# Patient Record
Sex: Female | Born: 1970
Health system: Southern US, Community
[De-identification: ages and names within clinical notes are randomized; demographics above are authoritative.]

## PROBLEM LIST (undated history)

## (undated) ENCOUNTER — Ambulatory Visit (HOSPITAL_COMMUNITY): Admission: EM | Discharge status: Absent without leave | Payer: 59

## (undated) DIAGNOSIS — R002 Palpitations: Secondary | ICD-10-CM

## (undated) DIAGNOSIS — E039 Hypothyroidism, unspecified: Secondary | ICD-10-CM

## (undated) DIAGNOSIS — K649 Unspecified hemorrhoids: Secondary | ICD-10-CM

## (undated) DIAGNOSIS — K6289 Other specified diseases of anus and rectum: Secondary | ICD-10-CM

## (undated) HISTORY — DX: Other specified diseases of anus and rectum: K62.89

## (undated) HISTORY — DX: Hypothyroidism, unspecified: E03.9

## (undated) HISTORY — DX: Unspecified hemorrhoids: K64.9

## (undated) HISTORY — DX: Palpitations: R00.2

---

## 1996-12-29 HISTORY — PX: THYROIDECTOMY: SHX17

## 2000-12-30 ENCOUNTER — Other Ambulatory Visit: Admission: RE | Admit: 2000-12-30 | Discharge: 2000-12-30 | Payer: Self-pay | Admitting: Obstetrics and Gynecology

## 2002-07-26 ENCOUNTER — Encounter
Admission: RE | Admit: 2002-07-26 | Discharge: 2002-10-24 | Payer: Self-pay | Admitting: Physical Medicine & Rehabilitation

## 2003-04-25 ENCOUNTER — Emergency Department (HOSPITAL_COMMUNITY): Admission: EM | Admit: 2003-04-25 | Discharge: 2003-04-25 | Payer: Self-pay | Admitting: Emergency Medicine

## 2003-05-23 HISTORY — PX: OTHER SURGICAL HISTORY: SHX169

## 2004-04-24 ENCOUNTER — Other Ambulatory Visit: Admission: RE | Admit: 2004-04-24 | Discharge: 2004-04-24 | Payer: Self-pay | Admitting: Obstetrics & Gynecology

## 2004-07-15 ENCOUNTER — Ambulatory Visit: Payer: Self-pay | Admitting: Endocrinology

## 2005-02-19 ENCOUNTER — Ambulatory Visit: Payer: Self-pay | Admitting: Endocrinology

## 2005-11-20 ENCOUNTER — Ambulatory Visit: Payer: Self-pay | Admitting: Endocrinology

## 2006-01-20 HISTORY — PX: TOTAL ABDOMINAL HYSTERECTOMY: SHX209

## 2006-06-13 ENCOUNTER — Ambulatory Visit (HOSPITAL_COMMUNITY): Admission: RE | Admit: 2006-06-13 | Discharge: 2006-06-13 | Payer: Self-pay

## 2006-08-07 ENCOUNTER — Ambulatory Visit (HOSPITAL_COMMUNITY): Admission: RE | Admit: 2006-08-07 | Discharge: 2006-08-08 | Payer: Self-pay | Admitting: Obstetrics & Gynecology

## 2006-08-07 ENCOUNTER — Encounter (INDEPENDENT_AMBULATORY_CARE_PROVIDER_SITE_OTHER): Payer: Self-pay | Admitting: Obstetrics & Gynecology

## 2006-09-30 ENCOUNTER — Ambulatory Visit (HOSPITAL_COMMUNITY): Admission: RE | Admit: 2006-09-30 | Discharge: 2006-09-30 | Payer: Self-pay | Admitting: Emergency Medicine

## 2006-10-08 ENCOUNTER — Ambulatory Visit (HOSPITAL_COMMUNITY): Admission: RE | Admit: 2006-10-08 | Discharge: 2006-10-08 | Payer: Self-pay | Admitting: Emergency Medicine

## 2006-10-23 ENCOUNTER — Encounter: Payer: Self-pay | Admitting: *Deleted

## 2007-04-13 ENCOUNTER — Telehealth: Payer: Self-pay | Admitting: Endocrinology

## 2007-04-16 ENCOUNTER — Ambulatory Visit: Payer: Self-pay | Admitting: Endocrinology

## 2007-04-16 DIAGNOSIS — E7800 Pure hypercholesterolemia, unspecified: Secondary | ICD-10-CM | POA: Insufficient documentation

## 2007-04-16 LAB — CONVERTED CEMR LAB
BUN: 8 mg/dL (ref 6–23)
Calcium: 9.1 mg/dL (ref 8.4–10.5)
Creatinine, Ser: 0.7 mg/dL (ref 0.4–1.2)
Glucose, Bld: 88 mg/dL (ref 70–99)
TSH: 1.93 microintl units/mL (ref 0.35–5.50)
VLDL: 29 mg/dL (ref 0–40)

## 2007-04-21 ENCOUNTER — Telehealth (INDEPENDENT_AMBULATORY_CARE_PROVIDER_SITE_OTHER): Payer: Self-pay | Admitting: *Deleted

## 2007-09-21 ENCOUNTER — Telehealth (INDEPENDENT_AMBULATORY_CARE_PROVIDER_SITE_OTHER): Payer: Self-pay | Admitting: *Deleted

## 2007-09-22 ENCOUNTER — Ambulatory Visit: Payer: Self-pay | Admitting: Endocrinology

## 2007-09-22 DIAGNOSIS — R221 Localized swelling, mass and lump, neck: Secondary | ICD-10-CM

## 2007-09-22 DIAGNOSIS — E890 Postprocedural hypothyroidism: Secondary | ICD-10-CM | POA: Insufficient documentation

## 2007-09-22 DIAGNOSIS — R220 Localized swelling, mass and lump, head: Secondary | ICD-10-CM | POA: Insufficient documentation

## 2007-09-22 DIAGNOSIS — E042 Nontoxic multinodular goiter: Secondary | ICD-10-CM | POA: Insufficient documentation

## 2007-09-24 ENCOUNTER — Ambulatory Visit: Payer: Self-pay | Admitting: Cardiovascular Disease

## 2008-05-02 ENCOUNTER — Telehealth: Payer: Self-pay | Admitting: Endocrinology

## 2008-06-07 ENCOUNTER — Ambulatory Visit: Payer: Self-pay | Admitting: Family Medicine

## 2008-06-07 DIAGNOSIS — B360 Pityriasis versicolor: Secondary | ICD-10-CM | POA: Insufficient documentation

## 2008-06-07 DIAGNOSIS — K838 Other specified diseases of biliary tract: Secondary | ICD-10-CM | POA: Insufficient documentation

## 2008-06-08 ENCOUNTER — Encounter (INDEPENDENT_AMBULATORY_CARE_PROVIDER_SITE_OTHER): Payer: Self-pay | Admitting: *Deleted

## 2008-06-08 LAB — CONVERTED CEMR LAB
ALT: 23 units/L (ref 0–35)
Basophils Absolute: 0 10*3/uL (ref 0.0–0.1)
Calcium: 8.9 mg/dL (ref 8.4–10.5)
Cholesterol: 168 mg/dL (ref 0–200)
Creatinine, Ser: 0.8 mg/dL (ref 0.4–1.2)
Eosinophils Absolute: 0.1 10*3/uL (ref 0.0–0.7)
Eosinophils Relative: 1.7 % (ref 0.0–5.0)
GFR calc non Af Amer: 84.41 mL/min (ref >60)
Glucose, Bld: 87 mg/dL (ref 70–99)
Hemoglobin: 13.9 g/dL (ref 12.0–15.0)
LDL Cholesterol: 124 mg/dL — ABNORMAL HIGH (ref 0–99)
Lymphs Abs: 2.3 10*3/uL (ref 0.7–4.0)
MCV: 82.4 fL (ref 78.0–100.0)
Sodium: 141 meq/L (ref 135–145)
TSH: 3.75 microintl units/mL (ref 0.35–5.50)
Total Bilirubin: 0.8 mg/dL (ref 0.3–1.2)

## 2008-06-10 ENCOUNTER — Encounter: Admission: RE | Admit: 2008-06-10 | Discharge: 2008-06-10 | Payer: Self-pay | Admitting: Family Medicine

## 2008-06-13 ENCOUNTER — Telehealth (INDEPENDENT_AMBULATORY_CARE_PROVIDER_SITE_OTHER): Payer: Self-pay | Admitting: *Deleted

## 2008-11-20 LAB — CONVERTED CEMR LAB: Pap Smear: NORMAL

## 2009-03-02 ENCOUNTER — Ambulatory Visit: Payer: Self-pay | Admitting: Family

## 2009-03-06 ENCOUNTER — Telehealth: Payer: Self-pay | Admitting: Endocrinology

## 2009-03-12 ENCOUNTER — Encounter: Payer: Self-pay | Admitting: Family

## 2009-03-15 ENCOUNTER — Encounter (INDEPENDENT_AMBULATORY_CARE_PROVIDER_SITE_OTHER): Payer: Self-pay | Admitting: *Deleted

## 2009-05-21 ENCOUNTER — Ambulatory Visit: Payer: Self-pay | Admitting: Family Medicine

## 2009-05-21 DIAGNOSIS — H6980 Other specified disorders of Eustachian tube, unspecified ear: Secondary | ICD-10-CM | POA: Insufficient documentation

## 2009-06-20 HISTORY — PX: OTHER SURGICAL HISTORY: SHX169

## 2009-06-29 ENCOUNTER — Ambulatory Visit: Payer: Self-pay | Admitting: Family Medicine

## 2009-06-29 DIAGNOSIS — K6289 Other specified diseases of anus and rectum: Secondary | ICD-10-CM | POA: Insufficient documentation

## 2009-07-01 LAB — CONVERTED CEMR LAB: Free T4: 1.42 ng/dL (ref 0.80–1.80)

## 2009-07-03 ENCOUNTER — Encounter (INDEPENDENT_AMBULATORY_CARE_PROVIDER_SITE_OTHER): Payer: Self-pay | Admitting: *Deleted

## 2009-07-05 ENCOUNTER — Telehealth (INDEPENDENT_AMBULATORY_CARE_PROVIDER_SITE_OTHER): Payer: Self-pay | Admitting: *Deleted

## 2009-07-05 LAB — CONVERTED CEMR LAB
Albumin: 3.9 g/dL (ref 3.5–5.2)
BUN: 11 mg/dL (ref 6–23)
Basophils Absolute: 0.1 10*3/uL (ref 0.0–0.1)
Basophils Relative: 1.1 % (ref 0.0–3.0)
Chloride: 108 meq/L (ref 96–112)
Creatinine, Ser: 0.8 mg/dL (ref 0.4–1.2)
Eosinophils Absolute: 0.1 10*3/uL (ref 0.0–0.7)
GFR calc non Af Amer: 89.09 mL/min (ref >60)
Glucose, Bld: 93 mg/dL (ref 70–99)
HCT: 39.9 % (ref 36.0–46.0)
Hemoglobin: 13.7 g/dL (ref 12.0–15.0)
LDL Cholesterol: 133 mg/dL — ABNORMAL HIGH (ref 0–99)
Lymphs Abs: 2.2 10*3/uL (ref 0.7–4.0)
MCV: 85.2 fL (ref 78.0–100.0)
Monocytes Relative: 8.5 % (ref 3.0–12.0)
TSH: 0.1 microintl units/mL — ABNORMAL LOW (ref 0.35–5.50)
Triglycerides: 75 mg/dL (ref 0.0–149.0)
VLDL: 15 mg/dL (ref 0.0–40.0)
Vit D, 25-Hydroxy: 22 ng/mL — ABNORMAL LOW (ref 30–89)

## 2009-07-16 ENCOUNTER — Ambulatory Visit: Payer: Self-pay | Admitting: Family Medicine

## 2009-07-17 LAB — CONVERTED CEMR LAB
ALT: 29 units/L (ref 0–35)
AST: 26 units/L (ref 0–37)

## 2009-08-23 ENCOUNTER — Telehealth: Payer: Self-pay | Admitting: Family Medicine

## 2009-09-11 ENCOUNTER — Ambulatory Visit: Payer: Self-pay | Admitting: Gastroenterology

## 2009-10-04 ENCOUNTER — Ambulatory Visit: Payer: Self-pay | Admitting: Gastroenterology

## 2009-10-04 ENCOUNTER — Encounter (INDEPENDENT_AMBULATORY_CARE_PROVIDER_SITE_OTHER): Payer: Self-pay | Admitting: *Deleted

## 2009-10-04 DIAGNOSIS — K602 Anal fissure, unspecified: Secondary | ICD-10-CM | POA: Insufficient documentation

## 2009-10-12 ENCOUNTER — Ambulatory Visit: Payer: Self-pay | Admitting: Endocrinology

## 2009-10-22 ENCOUNTER — Ambulatory Visit: Payer: Self-pay | Admitting: Family Medicine

## 2009-10-22 DIAGNOSIS — J019 Acute sinusitis, unspecified: Secondary | ICD-10-CM | POA: Insufficient documentation

## 2009-11-20 ENCOUNTER — Telehealth: Payer: Self-pay | Admitting: Gastroenterology

## 2009-11-21 ENCOUNTER — Ambulatory Visit: Payer: Self-pay | Admitting: Gastroenterology

## 2009-11-21 DIAGNOSIS — K649 Unspecified hemorrhoids: Secondary | ICD-10-CM

## 2009-11-21 DIAGNOSIS — K6289 Other specified diseases of anus and rectum: Secondary | ICD-10-CM

## 2009-11-21 HISTORY — DX: Other specified diseases of anus and rectum: K62.89

## 2009-11-21 HISTORY — DX: Unspecified hemorrhoids: K64.9

## 2009-11-21 LAB — HM COLONOSCOPY: HM Colonoscopy: NORMAL

## 2010-02-11 ENCOUNTER — Encounter: Payer: Self-pay | Admitting: Emergency Medicine

## 2010-02-19 ENCOUNTER — Other Ambulatory Visit: Payer: Self-pay | Admitting: Endocrinology

## 2010-02-19 ENCOUNTER — Ambulatory Visit
Admission: RE | Admit: 2010-02-19 | Discharge: 2010-02-19 | Payer: Self-pay | Source: Home / Self Care | Attending: Endocrinology | Admitting: Endocrinology

## 2010-02-19 NOTE — Progress Notes (Signed)
Summary: labs-lm  Phone Note Outgoing Call   Call placed by: Doristine Devoid,  July 05, 2009 11:25 AM Call placed to: Patient Summary of Call: level low.  start Vit D 50,000 units x8 weeks and then repeat labs. please add free T3/T4 to labs due to low TSH  AST mildly elevated.  should avoid tylenol and ETOH and have recheck in 1-2 weeks to trend level  remainder of labs ok.  LDL will continue to improve w/ diet and exercise  Follow-up for Phone Call        left msg w/ female to have patient return call........Marland KitchenDoristine Devoid  July 05, 2009 11:27 AM   spoke w/ patient aware of labs and appt scheduled to recheck liver function.......Marland KitchenDoristine Devoid  July 13, 2009 3:50 PM     New/Updated Medications: VITAMIN D (ERGOCALCIFEROL) 50000 UNIT CAPS (ERGOCALCIFEROL)  VITAMIN D (ERGOCALCIFEROL) 50000 UNIT CAPS (ERGOCALCIFEROL) take one tablet weekly x8 weeks Prescriptions: VITAMIN D (ERGOCALCIFEROL) 50000 UNIT CAPS (ERGOCALCIFEROL) take one tablet weekly x8 weeks  #8 x 0   Entered by:   Doristine Devoid   Authorized by:   Neena Rhymes MD   Signed by:   Doristine Devoid on 07/13/2009   Method used:   Electronically to        [facility] [phone]* (retail)       [address]       [place], [place]  [address]       Ph: [phone]       Fax: [phone]   RxID:   [id number]

## 2010-02-19 NOTE — Assessment & Plan Note (Signed)
Summary: cpx & labs/cbs   Vital Signs:  Patient profile:   40 year old female Height:      66 inches Weight:      190 pounds BMI:     30.78 Pulse rate:   76 / minute BP sitting:   112 / 80  (left arm)  Vitals Entered By: Doristine Devoid (June 29, 2009 8:17 AM) CC: CPX AND LAB   History of Present Illness: 40 yo woman here today for CPE.  GYNArlyce Dice, UTD on pap and mammogram.    would like to see GI for colonoscopy, family hx of polyps (mother), pt has hx of rectal fissure w/ occasional BRBPR.  also hx of hemorrhoids.  also has rectal pressure which is severe enough to wake pt from sleep (sxs started after complete hysterectomy 3 yrs ago).  pressure occurs  ~1x/month.  'it's like a balloon coming out'.  no relief w/ BM  Preventive Screening-Counseling & Management  Alcohol-Tobacco     Alcohol drinks/day: 0     Smoking Status: never  Caffeine-Diet-Exercise     Does Patient Exercise: yes     Type of exercise: aerobics, P90X      Drug Use:  never.    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 Take 1 By Mouth Qd 2)  Zolpidem Tartrate 10 Mg Tabs (Zolpidem Tartrate) .... Take One Tablet At Bedtime 3)  Nasonex 50 Mcg/act Susp (Mometasone Furoate) .... 2 Sprays Each Nostril Once Daily  Allergies (verified): No Known Drug Allergies  Past History:  Past Medical History: Last updated: 10/23/2006 Hypothyroidism Noncardiogenic C.P. (2005)  Family History: Last updated:  06/07/2008 Aunt- bone cancer, thyroid dz Aunt- liver cancer Mother- HTN maternal GM- DM, HTN maternal GF- CAD maternal uncles- CAD, HTN  Past Surgical History: Thyroidectomy (12/29/1996) Stress Cardiolite (05/23/2003) hysterectomy 2008 bone spur removed on R big toe, 6/11  Review of Systems  The patient denies anorexia, fever, weight loss, weight gain, vision loss, decreased hearing, hoarseness, chest pain, syncope, dyspnea on exertion, peripheral edema, prolonged cough, headaches, abdominal pain, melena, severe indigestion/heartburn, hematuria, suspicious skin lesions, depression, abnormal bleeding, enlarged lymph nodes, and breast masses.    Physical Exam  General:  Well-developed,well-nourished,in no acute distress; alert,appropriate and cooperative throughout examination Head:  Normocephalic and atraumatic without obvious abnormalities. No apparent alopecia or balding.  Eyes:  PERRL, EOMI, fundi WNL Ears:  External ear exam shows no significant lesions or deformities.  Otoscopic examination reveals clear canals, tympanic membranes are intact bilaterally without bulging, retraction, inflammation or discharge. Hearing is grossly normal bilaterally. Nose:  External nasal examination shows no deformity or inflammation. Nasal mucosa are pink and moist without lesions or exudates. Mouth:  Oral mucosa and oropharynx without lesions or exudates.  Teeth in good repair. Neck:  No deformities, masses, or tenderness noted. Breasts:  deferred to gyn Lungs:  Normal respiratory effort, chest expands symmetrically. Lungs are clear to auscultation, no crackles or wheezes. Heart:  Normal rate and regular rhythm. S1 and S2 normal without gallop,  murmur, click, rub or other extra sounds. Abdomen:  Bowel sounds positive,abdomen soft and non-tender without masses, organomegaly or hernias noted. Rectal:  deferred at pt's request due to upcoming GI appt Genitalia:  deferred to gyn Msk:  No deformity or  scoliosis noted of thoracic or lumbar spine.   Pulses:  +2 carotid, radial, DP Extremities:  No clubbing, cyanosis, edema, or deformity noted with normal full range of motion of all joints.   Neurologic:  No cranial nerve deficits noted. Station and gait are normal. Plantar reflexes are down-going bilaterally. DTRs are symmetrical throughout. Sensory, motor and coordinative functions appear intact. Skin:  tinea versicolor on chest, back Cervical Nodes:  No lymphadenopathy noted Axillary Nodes:  No palpable lymphadenopathy Psych:  Cognition and judgment appear intact. Alert and cooperative with normal attention span and concentration. No apparent delusions, illusions, hallucinations   Impression & Recommendations:  Problem # 1:  PHYSICAL EXAMINATION (ICD-V70.0) Assessment Unchanged pt's PE WNL.  check labs as below.  UTD on health maintainence.  anticipatory guidance provided. Orders: Venipuncture ([address]) TLB-BMP (Basic Metabolic Panel-BMET) (80048-METABOL) TLB-CBC Platelet - w/Differential (85025-CBCD) TLB-TSH (Thyroid Stimulating Hormone) (84443-TSH) T-Vitamin D (25-Hydroxy) ([address]11-91478)  Problem # 2:  RECTAL PAIN (GNF-621.30) Assessment: New  pt's sxs present x3 yrs (since hysterectomy).  pressure is infrequent/intermittant but severe.  will refer to GI at pt's request.  Orders: Gastroenterology Referral (GI)  Problem # 3:  HYPERCHOLESTEROLEMIA (ICD-272.0) Assessment: Unchanged pt attempting to control w/ diet and exercise.  check labs. Orders: TLB-Lipid Panel (80061-LIPID) TLB-Hepatic/Liver Function Pnl (80076-HEPATIC)  Complete Medication List: 1)  Synthroid 125 Mcg Tabs (Levothyroxine sodium) .... Take 1 by mouth qd 2)  Zolpidem Tartrate 10 Mg Tabs (Zolpidem tartrate) .... Take one tablet at bedtime 3)  Nasonex 50 Mcg/act Susp (Mometasone furoate) .... 2 sprays each nostril once daily  Patient Instructions: 1)  Follow up in 1 year or as needed 2)  We'll notify you of  your lab results 3)  Someone will call you with your GI appt for your colonoscopy 4)  Keep up the good work on diet and exercise- you look great! 5)  Call with any questions or concerns 6)  Good luck with your surgery!!  Preventive Care Screening  Mammogram:    Date:  11/20/2008    Results:  normal   Pap Smear:    Date:  11/20/2008    Results:  normal

## 2010-02-19 NOTE — Assessment & Plan Note (Signed)
Summary: 2 WEEK FU   History of Present Illness Visit Type: Follow-up Visit Primary GI MD: Sheryn Bison MD FACP FAGA Primary Provider: Neena Rhymes, MD Requesting Provider: na Chief Complaint: Rectal pain has subsided, no bleeding History of Present Illness:   Gradual daily improvement in her rectal pain and bleeding.   GI Review of Systems      Denies abdominal pain, acid reflux, belching, bloating, chest pain, dysphagia with liquids, dysphagia with solids, heartburn, loss of appetite, nausea, vomiting, vomiting blood, weight loss, and  weight gain.      Reports rectal bleeding and  rectal pain.     Denies anal fissure, black tarry stools, change in bowel habit, constipation, diarrhea, diverticulosis, fecal incontinence, heme positive stool, hemorrhoids, irritable bowel syndrome, jaundice, light color stool, and  liver problems.    Current Medications (verified): 1)  Synthroid 125 Mcg  Tabs (Levothyroxine Sodium) .... Take 1 By Mouth Qd 2)  Zolpidem Tartrate 10 Mg Tabs (Zolpidem Tartrate) .... Take One Tablet At Bedtime 3)  Nupercainal 1 % Oint (Dibucaine) .... Apply Once A Day  As Needed For Pain/itching.  Disp 60 Grams 4)  Diltiazem Cream 2% .... Apply To Rectum Q 2-3 Hrs As Needed 5)  Tramadol Hcl 50 Mg Tabs (Tramadol Hcl) .Marland Kitchen.. 1 Q 6-8 Hrs As Needed 6)  Colace 100 Mg Caps (Docusate Sodium) .... Two Times A Day  Allergies (verified): No Known Drug Allergies  Past History:  Past medical, surgical, family and social histories (including risk factors) reviewed for relevance to current acute and chronic problems.  Past Medical History: Reviewed history from 10/23/2006 and no changes required. Hypothyroidism Noncardiogenic C.P. (2005)  Past Surgical History: Reviewed history from 06/29/2009 and no changes required. Thyroidectomy (12/29/1996) Stress Cardiolite (05/23/2003) hysterectomy 2008 bone spur removed on R big toe, 6/11  Family History: Reviewed history from  09/11/2009 and no changes required. Aunt- bone cancer, thyroid dz Aunt- liver cancer Mother- HTN maternal GM- DM, HTN maternal GF- CAD maternal uncles- CAD, HTN Family History of Colon Polyps: mother No FH of Colon Cancer:  Social History: Reviewed history from 09/11/2009 and no changes required. divorced, 2 boys (1995, 26) works Clinical biochemist Patient has never smoked.  Alcohol Use - no Illicit Drug Use - no  Review of Systems  The patient denies allergy/sinus, anemia, anxiety-new, arthritis/joint pain, back pain, blood in urine, breast changes/lumps, change in vision, confusion, cough, coughing up blood, depression-new, fainting, fatigue, fever, headaches-new, hearing problems, heart murmur, heart rhythm changes, itching, menstrual pain, muscle pains/cramps, night sweats, nosebleeds, pregnancy symptoms, shortness of breath, skin rash, sleeping problems, sore throat, swelling of feet/legs, swollen lymph glands, thirst - excessive , urination - excessive , urination changes/pain, urine leakage, vision changes, and voice change.    Vital Signs:  Patient profile:   40 year old female Height:      66 inches Weight:      182.38 pounds BMI:     29.54 Pulse rate:   64 / minute Pulse rhythm:   regular BP sitting:   92 / 60  (left arm) Cuff size:   regular  Vitals Entered By: June McMurray CMA Duncan Dull) (October 04, 2009 11:02 AM)  Physical Exam  General:  Well developed, well nourished, no acute distress.healthy appearing.   Head:  Normocephalic and atraumatic. Eyes:  PERRLA, no icterus.exam deferred to patient's ophthalmologist.   Rectal:  refused by the patient Psych:  Alert and cooperative. Normal mood and affect.   Impression & Recommendations:  Problem # 1:  ANAL FISSURE (ICD-565.0) Assessment Improved continue current management with colonoscopy exam in 6-8 weeks' time. Orders: Colonoscopy (Colon)  Patient Instructions: 1)  Copy sent to : Neena Rhymes,  MD 2)  [facility] Patient Information Guide given to patient.  3)  Colonoscopy and Flexible Sigmoidoscopy brochure given.  4)  Conscious Sedation brochure given.  5)  Your prescription(s) have been sent to you pharmacy.  6)  Your procedure has been scheduled for11/02/2009, please follow the seperate instructions.  7)  The medication list was reviewed and reconciled.  All changed / newly prescribed medications were explained.  A complete medication list was provided to the patient / caregiver. Prescriptions: MOVIPREP 100 GM  SOLR (PEG-KCL-NACL-NASULF-NA ASC-C) As per prep instructions.  #1 x 0   Entered by:   Harlow Mares CMA (AAMA)   Authorized by:   Mardella Layman MD [facility]   Signed by:   Mardella Layman MD [facility] on 10/04/2009   Method used:   Electronically to        [facility] [phone]* (retail)       [address]       [place], [place]  [address]       Ph: [phone]       Fax: [phone]   RxID:   [id number]

## 2010-02-19 NOTE — Procedures (Signed)
Summary: Colonoscopy  Patient: [name] Note: All result statuses are Final unless otherwise noted.  Tests: (1) Colonoscopy (COL)   COL Colonoscopy           DONE     [facility]     [address]     [place], [place]  [address]           COLONOSCOPY PROCEDURE REPORT           PATIENT:  Karen Merritt, Karen Merritt  MR#:  [id number]     BIRTHDATE:  1970/02/25, 39 yrs. old  GENDER:  female     ENDOSCOPIST:  Vania Rea. Jarold Motto, MD, [place]     REF. BY:  Helane Rima. Beverely Low, M.D.     PROCEDURE DATE:  11/21/2009     PROCEDURE:  Diagnostic Colonoscopy     ASA CLASS:  Class I     INDICATIONS:  Colorectal cancer screening, average risk rectal     fissure     MEDICATIONS:   Fentanyl 100 mcg IV, Versed 10 mg IV           DESCRIPTION OF PROCEDURE:   After the risks benefits and     alternatives of the procedure were thoroughly explained, informed     consent was obtained.  Digital rectal exam was performed and     revealed perianal skin tags.   The [id number] endoscope was     introduced through the anus and advanced to the cecum, which was     identified by both the appendix and ileocecal valve, limited by a     redundant colon, poor preparation.    The quality of the prep was     adequate, using MoviPrep.  The instrument was then slowly     withdrawn as the colon was fully examined.     <<PROCEDUREIMAGES>>           FINDINGS:  no active bleeding or blood in c.  no active bleeding     or blood in c.  External hemorrhoids were found.   Retroflexed     views in the rectum revealed hypertrophied anal papillae.     perianal tags.  The scope was then withdrawn from the patient and     the procedure completed.           COMPLICATIONS:  None     ENDOSCOPIC IMPRESSION:     1) No active bleeding or blood in c     2) External hemorrhoids     3) Hypertrophied anal papillae     healed anal fissure.     RECOMMENDATIONS:     1) high fiber diet     2) Continue current colorectal screening  recommendations for     "routine risk" patients with a repeat colonoscopy in 10 years.     REPEAT EXAM:  No           ______________________________     Vania Rea. Jarold Motto, MD, Clementeen Graham           CC:           n.     eSIGNED:   Vania Rea. Patterson at 11/21/2009 10:04 AM           [place], [id number]  Note: An exclamation mark (!) indicates a result that was not dispersed into the flowsheet. Document Creation Date: 11/21/2009 10:05 AM _______________________________________________________________________  (1) Order result status: Final Collection or observation date-time: 11/21/2009 09:59 Requested date-time:  Receipt date-time:  Reported date-time:  Referring Physician:   Ordering Physician: Sheryn Bison [phone]) Specimen Source:  Source: Launa Grill Order Number: [id number] Lab site:   Appended Document: Colonoscopy    Clinical Lists Changes  Observations: Added new observation of COLONNXTDUE: 11/2019 (11/21/2009 12:02)

## 2010-02-19 NOTE — Progress Notes (Signed)
Summary: Refill Request  Phone Note Call from Patient Call back at Home Phone [phone]   Caller: Patient Summary of Call: Patient called and is having a problem with pressure in her bottom. She is going to see gastro in 2 weeks but would love some reielf before then. She was given a cream at [facility] but it is now expired. The name Lidocaine cream.   Pharmacy is [facility] on [address]. Initial call taken by: Harold Barban,  August 23, 2009 1:52 PM  Follow-up for Phone Call        typically for hemorrhoids or anal pain/itching we do Nupercainal (a numbing cream similar to lidocaine).  available OTC but will also send script. Follow-up by: Neena Rhymes MD,  August 23, 2009 2:03 PM  Additional Follow-up for Phone Call Additional follow up Details #1::        Left detailed message for patient. She said she would be in a meeting @ 2pm and to leave a message.  Additional Follow-up by: Harold Barban,  August 23, 2009 2:15 PM    New/Updated Medications: NUPERCAINAL 1 % OINT (DIBUCAINE) apply three times a day as needed for pain/itching.  disp 60 grams Prescriptions: NUPERCAINAL 1 % OINT (DIBUCAINE) apply three times a day as needed for pain/itching.  disp 60 grams  #60 x 1   Entered and Authorized by:   Neena Rhymes MD   Signed by:   Neena Rhymes MD on 08/23/2009   Method used:   Electronically to        [facility] [phone]* (retail)       [address]       [place], [place]  [address]       Ph: [phone]       Fax: [phone]   RxID:   [id number]

## 2010-02-19 NOTE — Assessment & Plan Note (Signed)
Summary: RT EAR PAIN/RH......Karen Merritt   Vital Signs:  Patient profile:   40 year old female Height:      65.75 inches Weight:      190.25 pounds BMI:     31.05 Temp:     98.8 degrees F oral Pulse rate:   64 / minute BP sitting:   100 / 70  Vitals Entered By: Kandice Hams (May 21, 2009 3:52 PM) CC: c/o right ear pain, since last night   History of Present Illness: 40 yo woman here today w/ R ear pain.  sxs started last night.  pain w/ swallowing, pain comes in waves.  hx of similar w/ a fungal infxn at age 40.  no fevers, sore throat, cough, nasal congestion.  Current Medications (verified): 1)  Synthroid 125 Mcg  Tabs (Levothyroxine Sodium) .... Take 1 By Mouth Qd 2)  Zolpidem Tartrate 10 Mg Tabs (Zolpidem Tartrate) .... Take One Tablet At Bedtime 3)  Selenium Sulfide 2.5 % Lotn (Selenium Sulfide) .... Apply To Affected Area and Rinse Off After 10 Minutes.  Repeat Daily X7 Days and Then As Needed.  Disp 1 Large Tube 4)  Nasonex 50 Mcg/act Susp (Mometasone Furoate) .... 2 Sprays Each Nostril Once Daily  Allergies (verified): No Known Drug Allergies  Review of Systems      See HPI  Physical Exam  General:  Well-developed,well-nourished,in no acute distress; alert,appropriate and cooperative throughout examination Head:  Normocephalic and atraumatic without obvious abnormalities. No apparent alopecia or balding.  no TTP over sinuses Eyes:  PERRL, EOMI Ears:  TMs retracted bilaterally, R>L.  no erythema, good landmarks Nose:  + turbinate edema and congestion Mouth:  +PND   Impression & Recommendations:  Problem # 1:  EUSTACHIAN TUBE DYSFUNCTION, RIGHT (ICD-381.81) Assessment New likely cause of pt's pain.  treat w/ nasal steroids and OTC decongestants.  reviewed supportive care and red flags that should prompt return.  Pt expresses understanding and is in agreement w/ this plan.  Complete Medication List: 1)  Synthroid 125 Mcg Tabs (Levothyroxine sodium) .... Take 1 by mouth  qd 2)  Zolpidem Tartrate 10 Mg Tabs (Zolpidem tartrate) .... Take one tablet at bedtime 3)  Selenium Sulfide 2.5 % Lotn (Selenium sulfide) .... Apply to affected area and rinse off after 10 minutes.  repeat daily x7 days and then as needed.  disp 1 large tube 4)  Nasonex 50 Mcg/act Susp (Mometasone furoate) .... 2 sprays each nostril once daily  Patient Instructions: 1)  Your ear pain is likely due to Eustachian Tube dysfunction 2)  Use the nasal spray- 2 sprays each nostril daily 3)  Start the Sudafed (or store brand) decongestant to help open your ears 4)  Call with any questions or concerns 5)  Hang in there!! Prescriptions: NASONEX 50 MCG/ACT SUSP (MOMETASONE FUROATE) 2 sprays each nostril once daily  #1 x 3   Entered and Authorized by:   Neena Rhymes MD   Signed by:   Neena Rhymes MD on 05/21/2009   Method used:   Electronically to        [facility] [phone]* (retail)       [address]       [place], [place]  [address]       Ph: [phone]       Fax: [phone]   RxID:   [id number]

## 2010-02-19 NOTE — Progress Notes (Signed)
  Phone Note Refill Request Message from:  Fax from Pharmacy on March 06, 2009 9:39 AM  Refills Requested: Medication #1:  SYNTHROID 125 MCG  TABS take 1 by mouth qd   Dosage confirmed as above?Dosage Confirmed Initial call taken by: Josph Macho RMA,  March 06, 2009 9:39 AM    Prescriptions: SYNTHROID 125 MCG  TABS (LEVOTHYROXINE SODIUM) take 1 by mouth qd  #90 Tablet x 1   Entered by:   Josph Macho RMA   Authorized by:   Minus Breeding MD   Signed by:   Josph Macho RMA on 03/06/2009   Method used:   Electronically to        [facility] [phone]* (retail)       [address]       [place], [place]  [address]       Ph: [phone]       Fax: [phone]   RxID:   [id number]

## 2010-02-19 NOTE — Assessment & Plan Note (Signed)
Summary: Right foot ( Toe ), ? nail removal - jr   Vital Signs:  Patient profile:   40 year old female Weight:      187 pounds Pulse rate:   72 / minute BP sitting:   130 / 80  (left arm)  Vitals Entered By: Doristine Devoid (March 02, 2009 11:43 AM) CC: R big toe painful x57yrs    CC:  R big toe painful x5yrs .  History of Present Illness: Karen Merritt is a 40 year old female who presents today with c/o pain in the nail bed of her right great toe.  She notes that she had a pedicure 5 years ago.  Notes that manicurist cut very deeply into the nail bed injuring the associated skin.  Since that time the lateral aspect of the right great toenail does not adhere to nailbed skin.  She thus trims this area off.  Notes that when it does grow in it is hyperpigmented.  She has associated pain and tenderness in this area which makes wearing shoes difficult.  Notes that several years ago she tried an OTC cream for nail fungus without improvement.    Allergies: No Known Drug Allergies  Physical Exam  General:  Well-developed,well-nourished,in no acute distress; alert,appropriate and cooperative throughout examination Extremities:  right great toenail- lateral aspect missing, hyperpigmentation at base with dry skin on nailbed skin.     Impression & Recommendations:  Problem # 1:  ONYCHOMYCOSIS, TOENAILS (ICD-110.1) Assessment New Will plan referral to podiatry.  I suspect that patient had injury to nail bed and also has some associated fungal infection.  Will defer treatment to podiatry.  There is no clinical sign of associated cellulitis.   Orders: Podiatry Referral (Podiatry)  Problem # 2:  TINEA VERSICOLOR (ICD-111.0) Assessment: Comment Only requesting refill of selenium sulfide  Complete Medication List: 1)  Synthroid 125 Mcg Tabs (Levothyroxine sodium) .... Take 1 by mouth qd 2)  Zolpidem Tartrate 10 Mg Tabs (Zolpidem tartrate) .... Take one tablet at bedtime 3)  Selenium Sulfide 2.5 %  Lotn (Selenium sulfide) .... Apply to affected area and rinse off after 10 minutes.  repeat daily x7 days and then as needed.  disp 1 large tube  Patient Instructions: 1)  You will be contacted about your referral to podiatry. 2)  Please call us if toe becomes, red/hot/swollen, or if drainage Prescriptions: SELENIUM SULFIDE 2.5 % LOTN (SELENIUM SULFIDE) apply to affected area and rinse off after 10 minutes.  repeat daily x7 days and then as needed.  disp 1 large tube  #1 x 3   Entered and Authorized by:   Lemont Fillers FNP   Signed by:   Lemont Fillers FNP on 03/02/2009   Method used:   Electronically to        [facility] [phone]* (retail)       [address]       [place], [place]  [address]       Ph: [phone]       Fax: [phone]   RxID:   [id number]

## 2010-02-19 NOTE — Letter (Signed)
Summary: New Patient letter  [facility]  [address] [place], [place] [address]   Phone: [phone]  Fax: [phone]       07/03/2009 MRN: [id number]  [facility] [address] [place], [place]  [address]  Dear Ms. Lieber,  Welcome to the Gastroenterology Division at [facility].    You are scheduled to see Dr. Jarold Motto on 08/03/2009 at 8:30AM on the 3rd floor at [facility], [address]  We ask that you try to arrive at our office 15 minutes prior to your appointment time to allow for check-in.  We would like you to complete the enclosed self-administered evaluation form prior to your visit and bring it with you on the day of your appointment.  We will review it with you.  Also, please bring a complete list of all your medications or, if you prefer, bring the medication bottles and we will list them.  Please bring your insurance card so that we may make a copy of it.  If your insurance requires a referral to see a specialist, please bring your referral form from your primary care physician.  Co-payments are due at the time of your visit and may be paid by cash, check or credit card.     Your office visit will consist of a consult with your physician (includes a physical exam), any laboratory testing he/she may order, scheduling of any necessary diagnostic testing (e.g. x-ray, ultrasound, CT-scan), and scheduling of a procedure (e.g. Endoscopy, Colonoscopy) if required.  Please allow enough time on your schedule to allow for any/all of these possibilities.    If you cannot keep your appointment, please call [phone] to cancel or reschedule prior to your appointment date.  This allows [place] the opportunity to schedule an appointment for another patient in need of care.  If you do not cancel or reschedule by 5 p.m. the business day prior to your appointment date, you will be charged a $50.00 late cancellation/no-show fee.    Thank you for choosing  [facility] for your medical needs.  We appreciate the opportunity to care for you.  Please visit [place] at our website  to learn more about our practice.                     Sincerely,                                                             The Gastroenterology Division

## 2010-02-19 NOTE — Progress Notes (Signed)
Summary: prep ?'s  Phone Note Call from Patient Call back at Home Phone [phone]   Caller: Patient Call For: Dr. Jarold Motto Reason for Call: Talk to Nurse Summary of Call: prep ?'s Initial call taken by: Vallarie Mare,  November 20, 2009 9:32 AM  Follow-up for Phone Call        wants to start 1st dose of Movi sooner than 1700 today.  After talking with pt, RN advised pt not to start before 1400 today, drink plenty of clear liquids afterwards and adhere to original (5 hrs before procedure) schedule for 2nd dose of prep.  Maintain excellent hydration with clear liquids, call again if further questions, difficulties, etc. Follow-up by: Jennye Boroughs RN,  November 20, 2009 10:22 AM

## 2010-02-19 NOTE — Assessment & Plan Note (Signed)
Summary: f/u appt for thyroid/#/cd   Vital Signs:  Patient profile:   40 year old female Height:      66 inches (167.64 cm) Weight:      184.38 pounds (83.81 kg) BMI:     29.87 O2 Sat:      98 % on Room air Temp:     98.5 degrees F (36.94 degrees C) oral Pulse rate:   69 / minute BP sitting:   102 / 64  (left arm) Cuff size:   regular  Vitals Entered By: Brenton Grills MA (October 12, 2009 8:34 AM)  O2 Flow:  Room air CC: Yearly F/U on thyroid/aj   Referring Provider:  Neena Rhymes, MD Primary Provider:  Neena Rhymes, MD  CC:  Yearly F/U on thyroid/aj.  History of Present Illness: pt had thyroidectomy in 1998 due to the mass effect of a multinodular goiter.  she takes synthroid 125 micrograms/day, as rx'ed.  Current Medications (verified): 1)  Synthroid 125 Mcg  Tabs (Levothyroxine Sodium) .... Take 1 By Mouth Qd 2)  Zolpidem Tartrate 10 Mg Tabs (Zolpidem Tartrate) .... Take One Tablet At Bedtime 3)  Nupercainal 1 % Oint (Dibucaine) .... Apply Once A Day  As Needed For Pain/itching.  Disp 60 Grams 4)  Diltiazem Cream 2% .... Apply To Rectum Q 2-3 Hrs As Needed 5)  Tramadol Hcl 50 Mg Tabs (Tramadol Hcl) .Marland Kitchen.. 1 Q 6-8 Hrs As Needed 6)  Colace 100 Mg Caps (Docusate Sodium) .... Two Times A Day 7)  Moviprep 100 Gm  Solr (Peg-Kcl-Nacl-Nasulf-Na Asc-C) .... As Per Prep Instructions.  Allergies (verified): No Known Drug Allergies  Past History:  Past Medical History: Last updated: 10/23/2006 Hypothyroidism Noncardiogenic C.P. (2005)  Review of Systems  The patient denies weight loss and weight gain.    Physical Exam  General:  normal appearance.   Neck:  a healed scar is present.  i do not appreciate a nodule in the thyroid or elsewhere in the neck.    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 of recurrent endogenous thyroid function  Medications Added to Medication List This Visit: 1)  Levothyroxine Sodium 100 Mcg Tabs (Levothyroxine sodium) .Marland Kitchen.. 1 tab once daily  Other Orders: Est. Patient Level III ([address])  Patient Instructions: 1)  reduce levothyroxine to 100 micrograms/day. 2)  Please schedule a follow-up appointment in 4 months. Prescriptions: LEVOTHYROXINE SODIUM 100 MCG TABS (LEVOTHYROXINE SODIUM) 1 tab once daily  #90 x 3   Entered and Authorized by:   Minus Breeding MD   Signed by:   Minus Breeding MD on 10/12/2009   Method used:   Electronically to        [facility] [phone]* (retail)       [address]       [place], [place]  [address]       Ph: [phone]       Fax: [phone]   RxID:   [id number]

## 2010-02-19 NOTE — Assessment & Plan Note (Signed)
Summary: rectal pain, pressure...as.   History of Present Illness Visit Type: Initial Consult Primary GI MD: Sheryn Bison MD FACP FAGA Primary Provider: Neena Rhymes, MD Requesting Provider: Neena Rhymes, MD Chief Complaint: Patient complains of severe rectal pain and rectal bleeding that has been getting worse over the past month. She does have a hx of a anal fissure.  History of Present Illness:   40 year old [place] American female referred by Dr. Beverely Low for evaluation of rectal pain and rectal bleeding.  Dant has chronic hypothyroidism and is on Synthroid. She had a rectal fissure with her child many years ago, has had recurrent rectal pain and bleeding over the last 6-8 weeks worse or relieved by Nupercainal ointment. She has some mild constipation but denies abdominal pain, upper gastrointestinal, or hepatobiliary complaints. She is afraid to have a bowel movement because of rectal discomfort. All of this is been exacerbated by her returning to work after being out for extended period of time with foot problems. She also has rectal pain at nighttime and occasional back pain with associated nausea and vomiting. She has had no radiographs or colonoscopy procedures. Her family history is remarkable for colon polyps in her mother.  She follows a regular diet denies any specific food intolerances. Recent labs have been unremarkable.   GI Review of Systems    Reports nausea.      Denies abdominal pain, acid reflux, belching, bloating, chest pain, dysphagia with liquids, dysphagia with solids, heartburn, loss of appetite, vomiting, vomiting blood, weight loss, and  weight gain.      Reports hemorrhoids, rectal bleeding, and  rectal pain.     Denies anal fissure, black tarry stools, change in bowel habit, constipation, diarrhea, diverticulosis, fecal incontinence, heme positive stool, irritable bowel syndrome, jaundice, light color stool, and  liver problems. Preventive  Screening-Counseling & Management  Alcohol-Tobacco     Smoking Status: never      Drug Use:  no.      Current Medications (verified): 1)  Synthroid 125 Mcg  Tabs (Levothyroxine Sodium) .... Take 1 By Mouth Qd 2)  Zolpidem Tartrate 10 Mg Tabs (Zolpidem Tartrate) .... Take One Tablet At Bedtime 3)  Nupercainal 1 % Oint (Dibucaine) .... Apply Once A Day  As Needed For Pain/itching.  Disp 60 Grams  Allergies (verified): No Known Drug Allergies  Past History:  Past medical, surgical, family and social histories (including risk factors) reviewed for relevance to current acute and chronic problems.  Past Medical History: Reviewed history from 10/23/2006 and no changes required. Hypothyroidism Noncardiogenic C.P. (2005)  Past Surgical History: Reviewed history from 06/29/2009 and no changes required. Thyroidectomy (12/29/1996) Stress Cardiolite (05/23/2003) hysterectomy 2008 bone spur removed on R big toe, 6/11  Family History: Reviewed history from 06/07/2008 and no changes required. Aunt- bone cancer, thyroid dz Aunt- liver cancer Mother- HTN maternal GM- DM, HTN maternal GF- CAD maternal uncles- CAD, HTN Family History of Colon Polyps: mother No FH of Colon Cancer:  Social History: Reviewed history from 06/07/2008 and no changes required. divorced, 2 boys (1995, 19) works Clinical biochemist Patient has never smoked.  Alcohol Use - no Illicit Drug Use - no Drug Use:  no  Review of Systems       The patient complains of allergy/sinus, back pain, fatigue, and sleeping problems.  The patient denies anemia, anxiety-new, arthritis/joint pain, blood in urine, breast changes/lumps, change in vision, confusion, cough, coughing up blood, depression-new, fainting, fever, headaches-new, hearing problems, heart murmur, heart rhythm changes, itching,  menstrual pain, muscle pains/cramps, night sweats, nosebleeds, pregnancy symptoms, shortness of breath, skin rash, sore throat,  swelling of feet/legs, swollen lymph glands, thirst - excessive , urination - excessive , urination changes/pain, urine leakage, vision changes, and voice change.    Vital Signs:  Patient profile:   40 year old female Height:      66 inches Weight:      187.2 pounds BMI:     30.32 Pulse rate:   84 / minute Pulse rhythm:   regular BP sitting:   128 / 58  (left arm) Cuff size:   regular  Vitals Entered By: Harlow Mares CMA Duncan Dull) (September 11, 2009 2:57 PM)  Physical Exam  General:  Well developed, well nourished, no acute distress.healthy appearing.   Head:  Normocephalic and atraumatic. Eyes:  PERRLA, no icterus. Lungs:  Clear throughout to auscultation. Heart:  Regular rate and rhythm; no murmurs, rubs,  or bruits. Abdomen:  Soft, nontender and nondistended. No masses, hepatosplenomegaly or hernias noted. Normal bowel sounds. Rectal:  Normal exam.Posterior fissure noted with associated skin tags. Rectal exam shows thickening posteriorly with marked tenderness. There is soft to present it is guaiac positive. Msk:  Symmetrical with no gross deformities. Normal posture. Extremities:  No clubbing, cyanosis, edema or deformities noted. Neurologic:  Alert and  oriented x4;  grossly normal neurologically. Psych:  Alert and cooperative. Normal mood and affect.   Impression & Recommendations:  Problem # 1:  RECTAL PAIN (ZOX-096.04) Assessment Deteriorated Posterior anal fissure with associated rectal and back pain and constipation. I placed her on daily stool softeners, diltiazem cream to 6 times a day, and Nupercaine cream at bedtime as previously prescribed. Also prescribed tramadol 50 mg every 6-8 hours as needed for pain, we'll see her back in 2 weeks' time, and she will need colonoscopy once her rectum has improved.  Problem # 2:  GOITER, MULTINODULAR (ICD-241.1) Assessment: Improved continue Synthroid 125 micrograms a day as per primary care.  Patient Instructions: 1)  Pick up  your preccriptiions at your pharmacy. 2)  Please schedule a follow-up appointment in 2 weeks.  3)  The medication list was reviewed and reconciled.  All changed / newly prescribed medications were explained.  A complete medication list was provided to the patient / caregiver. 4)  Copy sent to : Dr. Santina Evans tube already 5)  Please continue current medications.  6)  Constipation and Hemorrhoids brochure given. 7)  Diltiazem  gel 5 to 6 times a day to perirectal area with continued Nupercaine at bedtime  8)  b.i.d. stool softeners as tolerated 9)  Anal Fissure, Abcess and fistula brochure given.  Prescriptions: COLACE 100 MG CAPS (DOCUSATE SODIUM) two times a day  #60 x 3   Entered by:   Ashok Cordia RN   Authorized by:   Mardella Layman MD [facility]   Signed by:   Ashok Cordia RN on 09/11/2009   Method used:   Electronically to        [facility] [phone]* (retail)       [address]       [place], [place]  [address]       Ph: [phone]       Fax: [phone]   RxID:   [id number] TRAMADOL HCL 50 MG TABS (TRAMADOL HCL) 1 q 6-8 hrs as needed  #50 x 1   Entered by:   Ashok Cordia RN   Authorized by:   Mardella Layman MD [facility]  Signed by:   Ashok Cordia RN on 09/11/2009   Method used:   Electronically to        [facility] [phone]* (retail)       [address]       [place], [place]  [address]       Ph: [phone]       Fax: [phone]   RxID:   [id number] DILTIAZEM CREAM 2% Apply to rectum q 2-3 hrs as needed  #45 gm x 1   Entered by:   Ashok Cordia RN   Authorized by:   Mardella Layman MD [facility]   Signed by:   Ashok Cordia RN on 09/11/2009   Method used:   Faxed to ...       [facility]* (retail)       [address]       [place], [place]  [id number]       Ph: [phone]       Fax: [phone]   RxID:   [id number]

## 2010-02-19 NOTE — Miscellaneous (Signed)
  Clinical Lists Changes  Observations: Added new observation of PODIATRIST: Dr. Larey Dresser (03/12/2009 14:29) Added new observation of DIABFOOTDPM: no diabetic findings (03/12/2009 14:29)        Diabetes Management Exam:    Foot Exam by Podiatrist:       Date: 03/12/2009       Results: no diabetic findings       Done by: Dr. Larey Dresser

## 2010-02-19 NOTE — Letter (Signed)
Summary: [facility] Instructions  [facility]  [address] [place], [place] [address]   Phone: [phone]  Fax: [phone]       Karen Merritt    1970-08-07    MRN: [id number]        Procedure Day [name]: Wednesday 11/21/2009     Arrival Time: 8:30am     Procedure Time: 9:30am     Location of Procedure:                    X  [facility] (4th Floor)   PREPARATION FOR COLONOSCOPY WITH MOVIPREP   Starting 5 days prior to your procedure 11/16/2009 do not eat nuts, seeds, popcorn, corn, beans, peas,  salads, or any raw vegetables.  Do not take any fiber supplements (e.g. Metamucil, Citrucel, and Benefiber).  THE DAY BEFORE YOUR PROCEDURE         DATE: 11/20/2009  DAY: Tuesday  1.  Drink clear liquids the entire day-NO SOLID FOOD  2.  Do not drink anything colored red or purple.  Avoid juices with pulp.  No orange juice.  3.  Drink at least 64 oz. (8 glasses) of fluid/clear liquids during the day to prevent dehydration and help the prep work efficiently.  CLEAR LIQUIDS INCLUDE: Water Jello Ice Popsicles Tea (sugar ok, no milk/cream) Powdered fruit flavored drinks Coffee (sugar ok, no milk/cream) Gatorade Juice: apple, white grape, white cranberry  Lemonade Clear bullion, consomm, broth Carbonated beverages (any kind) Strained chicken noodle soup Hard Candy                             4.  In the morning, mix first dose of MoviPrep solution:    Empty 1 Pouch A and 1 Pouch B into the disposable container    Add lukewarm drinking water to the top line of the container. Mix to dissolve    Refrigerate (mixed solution should be used within 24 hrs)  5.  Begin drinking the prep at 5:00 p.m. The MoviPrep container is divided by 4 marks.   Every 15 minutes drink the solution down to the next mark (approximately 8 oz) until the full liter is complete.   6.  Follow completed prep with 16 oz of clear liquid of your choice (Nothing red or purple).   Continue to drink clear liquids until bedtime.  7.  Before going to bed, mix second dose of MoviPrep solution:    Empty 1 Pouch A and 1 Pouch B into the disposable container    Add lukewarm drinking water to the top line of the container. Mix to dissolve    Refrigerate  THE DAY OF YOUR PROCEDURE      DATE: 11/21/2009 DAY: Wednesday  Beginning at 4:30am (5 hours before procedure):         1. Every 15 minutes, drink the solution down to the next mark (approx 8 oz) until the full liter is complete.  2. Follow completed prep with 16 oz. of clear liquid of your choice.    3. You may drink clear liquids until 7:30am (2 HOURS BEFORE PROCEDURE).   MEDICATION INSTRUCTIONS  Unless otherwise instructed, you should take regular prescription medications with a small sip of water   as early as possible the morning of your procedure.        OTHER INSTRUCTIONS  You will need a responsible adult at least 42 years of age to accompany  you and drive you home.   This person must remain in the waiting room during your procedure.  Wear loose fitting clothing that is easily removed.  Leave jewelry and other valuables at home.  However, you may wish to bring a book to read or  an iPod/MP3 player to listen to music as you wait for your procedure to start.  Remove all body piercing jewelry and leave at home.  Total time from sign-in until discharge is approximately 2-3 hours.  You should go home directly after your procedure and rest.  You can resume normal activities the  day after your procedure.  The day of your procedure you should not:   Drive   Make legal decisions   Operate machinery   Drink alcohol   Return to work  You will receive specific instructions about eating, activities and medications before you leave.    The above instructions have been reviewed and explained to me by   _______________________    I fully understand and can verbalize these instructions  _____________________________ Date _________

## 2010-02-19 NOTE — Consult Note (Signed)
Summary: [facility]   Imported By: Lanelle Bal 03/20/2009 10:50:55  _____________________________________________________________________  External Attachment:    Type:   Image     Comment:   External Document

## 2010-02-19 NOTE — Assessment & Plan Note (Signed)
Summary: POSSIBLE SINUS INFECTION/KB   Vital Signs:  Patient profile:   40 year old female Height:      66 inches Weight:      186 pounds BMI:     30.13 Temp:     97.3 degrees F oral BP sitting:   116 / 72  (right arm)  Vitals Entered By: Doristine Devoid CMA (October 22, 2009 2:06 PM) CC: sinus infection? having lots of pressure and HA   History of Present Illness: 40 yo woman here today for ? sinus infxn.  facial pain and pressure started Thursday.  + HA.  no fevers.  ears feel clogged.  no cough, nasal congestion.  'i feel like my face is caving in'.  no known sick contacts.  Current Medications (verified): 1)  Zolpidem Tartrate 10 Mg Tabs (Zolpidem Tartrate) .... Take One Tablet At Bedtime 2)  Nupercainal 1 % Oint (Dibucaine) .... Apply Once A Day  As Needed For Pain/itching.  Disp 60 Grams 3)  Diltiazem Cream 2% .... Apply To Rectum Q 2-3 Hrs As Needed 4)  Tramadol Hcl 50 Mg Tabs (Tramadol Hcl) .Marland Kitchen.. 1 Q 6-8 Hrs As Needed 5)  Colace 100 Mg Caps (Docusate Sodium) .... Two Times A Day 6)  Moviprep 100 Gm  Solr (Peg-Kcl-Nacl-Nasulf-Na Asc-C) .... As Per Prep Instructions. 7)  Levothyroxine Sodium 100 Mcg Tabs (Levothyroxine Sodium) .Marland Kitchen.. 1 Tab Once Daily  Allergies (verified): No Known Drug Allergies  Past History:  Past Medical History: Last updated: 10/23/2006 Hypothyroidism Noncardiogenic C.P. (2005)  Review of Systems      See HPI  Physical Exam  General:  obviously uncomfortable Head:  NCAT, + TTP over frontal and maxillary sinuses Eyes:  no injxn or inflammation Ears:  External ear exam shows no significant lesions or deformities.  Otoscopic examination reveals clear canals, tympanic membranes are intact bilaterally without bulging, retraction, inflammation or discharge. Hearing is grossly normal bilaterally. Nose:  marked turbinate edema Mouth:  Oral mucosa and oropharynx without lesions or exudates.  Teeth in good repair. + PND Neck:  No deformities, masses, or  tenderness noted. Lungs:  Normal respiratory effort, chest expands symmetrically. scattered expiratory wheeze. Heart:  Normal rate and regular rhythm. S1 and S2 normal without gallop, murmur, click, rub or other extra sounds. Cervical Nodes:  No lymphadenopathy noted   Impression & Recommendations:  Problem # 1:  SINUSITIS - ACUTE-NOS (ICD-461.9) Assessment New pt's hx and PE consistent w/ sinus infxn.  given edematous turbinates and PND, pt likely has allergy component.  start Amox, Nasonex sample given.  reviewed supportive care and red flags that should prompt return.  Pt expresses understanding and is in agreement w/ this plan. Her updated medication list for this problem includes:    Amoxicillin 500 Mg Tabs (Amoxicillin) .Marland Kitchen... 2 tabs by mouth two times a day x10 days  Complete Medication List: 1)  Zolpidem Tartrate 10 Mg Tabs (Zolpidem tartrate) .... Take one tablet at bedtime 2)  Nupercainal 1 % Oint (Dibucaine) .... Apply once a day  as needed for pain/itching.  disp 60 grams 3)  Diltiazem Cream 2%  .... Apply to rectum q 2-3 hrs as needed 4)  Tramadol Hcl 50 Mg Tabs (Tramadol hcl) .Marland Kitchen.. 1 q 6-8 hrs as needed 5)  Colace 100 Mg Caps (Docusate sodium) .... Two times a day 6)  Moviprep 100 Gm Solr (Peg-kcl-nacl-nasulf-na asc-c) .... As per prep instructions. 7)  Levothyroxine Sodium 100 Mcg Tabs (Levothyroxine sodium) .Marland Kitchen.. 1 tab once daily  8)  Amoxicillin 500 Mg Tabs (Amoxicillin) .... 2 tabs by mouth two times a day x10 days  Patient Instructions: 1)  This is a sinus infection 2)  Take the Amoxicillin as directed- take w/ food to avoid upset stomach 3)  Drink plenty of fluids 4)  Use the nasonex- 2 sprays each nostril daily 5)  Add Claritin or Zyrtec for the seasonal allergy component 6)  Tylenol or ibuprofen for pain or fever 7)  Hang in there!!! Prescriptions: AMOXICILLIN 500 MG TABS (AMOXICILLIN) 2 tabs by mouth two times a day x10 days  #40 x 0   Entered and Authorized by:    Neena Rhymes MD   Signed by:   Neena Rhymes MD on 10/22/2009   Method used:   Electronically to        [facility] [phone]* (retail)       [address]       [place], [place]  [address]       Ph: [phone]       Fax: [phone]   RxID:   [id number]

## 2010-02-22 ENCOUNTER — Encounter: Payer: Self-pay | Admitting: Family Medicine

## 2010-02-22 ENCOUNTER — Ambulatory Visit (INDEPENDENT_AMBULATORY_CARE_PROVIDER_SITE_OTHER): Payer: 59 | Admitting: Family Medicine

## 2010-02-22 DIAGNOSIS — R220 Localized swelling, mass and lump, head: Secondary | ICD-10-CM

## 2010-02-22 DIAGNOSIS — R519 Headache, unspecified: Secondary | ICD-10-CM | POA: Insufficient documentation

## 2010-02-22 DIAGNOSIS — R51 Headache: Secondary | ICD-10-CM

## 2010-02-25 ENCOUNTER — Telehealth: Payer: Self-pay | Admitting: Family Medicine

## 2010-02-27 NOTE — Assessment & Plan Note (Addendum)
Summary: 4 MTH FU---STC   Vital Signs:  Patient profile:   40 year old female Weight:      184.13 pounds (83.70 kg) BMI:     29.83 O2 Sat:      99 % on Room air Temp:     98.4 degrees F (36.89 degrees C) oral Pulse rate:   71 / minute Pulse rhythm:   regular BP sitting:   110 / 70  (left arm) Cuff size:   regular  Vitals Entered By: Brenton Grills CMA (AAMA) (February 19, 2010 1:16 PM)  O2 Flow:  Room air CC: 4 month F/U/aj Is Patient Diabetic? Yes   Referring Provider:  Neena Rhymes, MD Primary Provider:  Neena Rhymes, MD  CC:  4 month F/U/aj.  History of Present Illness: pt had thyroidectomy in 1998 due to the mass effect of a multinodular goiter.  she takes synthroid 100 micrograms/day, as rx'ed.  despite the decrease of her synthroid, she has persistent palpitations in the chest.   Current Medications (verified): 1)  Zolpidem Tartrate 10 Mg Tabs (Zolpidem Tartrate) .... Take One Tablet At Bedtime 2)  Nupercainal 1 % Oint (Dibucaine) .... Apply Once A Day  As Needed For Pain/itching.  Disp 60 Grams 3)  Diltiazem Cream 2% .... Apply To Rectum Q 2-3 Hrs As Needed 4)  Tramadol Hcl 50 Mg Tabs (Tramadol Hcl) .Marland Kitchen.. 1 Q 6-8 Hrs As Needed 5)  Colace 100 Mg Caps (Docusate Sodium) .... Two Times A Day 6)  Levothyroxine Sodium 100 Mcg Tabs (Levothyroxine Sodium) .Marland Kitchen.. 1 Tab Once Daily  Allergies (verified): No Known Drug Allergies  Past History:  Past Medical History: Last updated: 10/23/2006 Hypothyroidism Noncardiogenic C.P. (2005)  Review of Systems  The patient denies weight loss and weight gain.    Physical Exam  General:  normal appearance.   Neck:  a healed scar is present.  i do not appreciate a nodule in the thyroid or elsewhere in the neck.  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 Patient Level III (04540)  Patient Instructions: 1)  blood tests are being ordered for you today.  please call [phone] to hear your test results. 2)  pending the test results, please continue the same medications for now 3)  Please schedule a follow-up appointment in 6 months. 4)  (update: i left message on phone-tree:  rx as we discussed)   Orders Added: 1)  TLB-TSH (Thyroid Stimulating Hormone) [84443-TSH] 2)  Est. Patient Level III [78295]

## 2010-02-28 ENCOUNTER — Emergency Department (HOSPITAL_COMMUNITY)
Admission: EM | Admit: 2010-02-28 | Discharge: 2010-02-28 | Disposition: A | Discharge status: Home / Self Care | Payer: 59 | Attending: Emergency Medicine | Admitting: Emergency Medicine

## 2010-02-28 ENCOUNTER — Telehealth: Payer: Self-pay | Admitting: Family Medicine

## 2010-02-28 ENCOUNTER — Emergency Department (HOSPITAL_COMMUNITY): Payer: 59

## 2010-02-28 DIAGNOSIS — R0602 Shortness of breath: Secondary | ICD-10-CM | POA: Insufficient documentation

## 2010-02-28 DIAGNOSIS — R079 Chest pain, unspecified: Secondary | ICD-10-CM | POA: Insufficient documentation

## 2010-02-28 DIAGNOSIS — E039 Hypothyroidism, unspecified: Secondary | ICD-10-CM | POA: Insufficient documentation

## 2010-02-28 LAB — POCT CARDIAC MARKERS
CKMB, poc: <1 ng/mL — ABNORMAL LOW (ref 1.0–8.0)
Myoglobin, poc: 52.7 ng/mL (ref 12–200)
Myoglobin, poc: 58 ng/mL (ref 12–200)
Troponin i, poc: <0.05 ng/mL (ref 0.00–0.09)
Troponin i, poc: <0.05 ng/mL (ref 0.00–0.09)

## 2010-02-28 LAB — BASIC METABOLIC PANEL
GFR calc Af Amer: >60 mL/min (ref >60)
Potassium: 3.9 mEq/L (ref 3.5–5.1)
Sodium: 139 mEq/L (ref 135–145)

## 2010-02-28 LAB — CBC
HCT: 33 % — ABNORMAL LOW (ref 36.0–46.0)
Hemoglobin: 11.9 g/dL — ABNORMAL LOW (ref 12.0–15.0)
Platelets: 275 10*3/uL (ref 150–400)
RDW: 13.7 % (ref 11.5–15.5)

## 2010-02-28 LAB — DIFFERENTIAL
Basophils Absolute: 0.1 10*3/uL (ref 0.0–0.1)
Basophils Relative: 1 % (ref 0–1)
Lymphocytes Relative: 31 % (ref 12–46)
Lymphs Abs: 2.4 10*3/uL (ref 0.7–4.0)
Monocytes Absolute: 0.6 10*3/uL (ref 0.1–1.0)
Neutro Abs: 4.5 10*3/uL (ref 1.7–7.7)

## 2010-03-01 ENCOUNTER — Encounter: Payer: Self-pay | Admitting: Family Medicine

## 2010-03-01 ENCOUNTER — Ambulatory Visit (INDEPENDENT_AMBULATORY_CARE_PROVIDER_SITE_OTHER): Payer: 59 | Admitting: Family Medicine

## 2010-03-01 DIAGNOSIS — M940 Chondrocostal junction syndrome [Tietze]: Secondary | ICD-10-CM

## 2010-03-01 DIAGNOSIS — J019 Acute sinusitis, unspecified: Secondary | ICD-10-CM

## 2010-03-01 DIAGNOSIS — J309 Allergic rhinitis, unspecified: Secondary | ICD-10-CM | POA: Insufficient documentation

## 2010-03-07 NOTE — Progress Notes (Signed)
Summary: 2-9,2-10--FYI fyi chest discomfort--has appt today  Phone Note Call from Patient Call back at Work Phone [phone]   Caller: Patient Reason for Call: Referral Summary of Call: Pt c/o pain and tightness in chest, sob, tension in head. Pt denies any numbness or tingling. Pt notes that it is more like a asthmatic feeling of chest tightness that increases with every breath.Pt advise ED, pt did not Ok would like to get dr Beverely Low opinion.       Pls advise.Marland KitchenMarland KitchenMarland KitchenFelecia Deloach CMA  February 28, 2010 11:02 AM   Follow-up for Phone Call        pt doesn't have asthma on her problem list.  if she is having progressive SOB and tightness in chest needs to go to ER for evaluation.  would recommend the [facility] for fastest evaluation. Follow-up by: Neena Rhymes MD,  February 28, 2010 11:06 AM  Additional Follow-up for Phone Call Additional follow up Details #1::        Left message to call office .Marland KitchenMarland KitchenMarland KitchenFelecia Deloach CMA  February 28, 2010 11:19 AM  Left message to call office......Marland KitchenFelecia Deloach CMA  March 01, 2010 8:50 AM     Additional Follow-up for Phone Call Additional follow up Details #2::    went to ER--said tests OK---advised her to see PCP---she has appt to see Dr Beverely Low today 03/01/2010 at 11:30.Marland KitchenMarland KitchenJerolyn Shin  March 01, 2010 10:12 AM

## 2010-03-07 NOTE — Assessment & Plan Note (Signed)
Summary: chest pain--er says heart ok--need to see pcp///sph   Vital Signs:  Patient profile:   40 year old female Weight:      184 pounds BMI:     29.81 O2 Sat:      98 % on Room air Pulse rate:   82 / minute BP sitting:   110 / 76  (left arm)  Vitals Entered By: Doristine Devoid CMA (March 01, 2010 11:44 AM)  O2 Flow:  Room air CC: ER f/u still w/ chest pressure and hurts to breath    History of Present Illness: 40 yo woman here today for ER f/u.  had chest pain and pressure yesterday- went to ER and had normal EKG, CXR, CE's.  still having SOB, 'discomfort w/ breathing', chest is tender to touch.  today developed nasal congestion, sinus pressure, frontal sinus pain, tooth pain.  Current Medications (verified): 1)  Zolpidem Tartrate 10 Mg Tabs (Zolpidem Tartrate) .... Take One Tablet At Bedtime 2)  Nupercainal 1 % Oint (Dibucaine) .... Apply Once A Day  As Needed For Pain/itching.  Disp 60 Grams 3)  Diltiazem Cream 2% .... Apply To Rectum Q 2-3 Hrs As Needed 4)  Tramadol Hcl 50 Mg Tabs (Tramadol Hcl) .Marland Kitchen.. 1 Q 6-8 Hrs As Needed 5)  Colace 100 Mg Caps (Docusate Sodium) .... Two Times A Day 6)  Levothyroxine Sodium 100 Mcg Tabs (Levothyroxine Sodium) .Marland Kitchen.. 1 Tab Once Daily 7)  Cephalexin 500 Mg  Tabs (Cephalexin) .... Take One By Mouth 2 Times Daily 8)  Nasonex 50 Mcg/act Susp (Mometasone Furoate) .... 2 Sprays Each Nostril Once Daily 9)  Amoxicillin 875 Mg Tabs (Amoxicillin) .Marland Kitchen.. 1 Tab By Mouth Two Times A Day X10 Days 10)  Naproxen 500 Mg Tabs (Naproxen) .Marland Kitchen.. 1 Tab By Mouth Two Times A Day X5-7 Days and Then As Needed.  Take W/ Food.  Allergies (verified): No Known Drug Allergies  Past History:  Past Medical History: Hypothyroidism Noncardiogenic C.P. (2005, 2012)  Review of Systems      See HPI  Physical Exam  General:  Well-developed,well-nourished,in no acute distress; alert,appropriate and cooperative throughout examination Head:  NCAT, + TTP over maxillary  sinuses Eyes:  no injxn or inflammation Ears:  External ear exam shows no significant lesions or deformities.  Otoscopic examination reveals clear canals, tympanic membranes are intact bilaterally without bulging, retraction, inflammation or discharge. Hearing is grossly normal bilaterally. Nose:  marked turbinate edema Mouth:  Oral mucosa and oropharynx without lesions or exudates.  Teeth in good repair. + PND Neck:  No deformities, masses, or tenderness noted. Chest Wall:  + TTP over sternum Lungs:  Normal respiratory effort, chest expands symmetrically.  CTAB Heart:  Normal rate and regular rhythm. S1 and S2 normal without gallop, murmur, click, rub or other extra sounds.   Impression & Recommendations:  Problem # 1:  COSTOCHONDRITIS (ICD-733.6) Assessment New pt's CP more consistent w/ musculoskeletal pain than cardiac.  reviewed ER records.  start NSAIDs for pain relief.  reviewed red flags that should prompt return.  Pt expresses understanding and is in agreement w/ this plan.  Problem # 2:  SINUSITIS - ACUTE-NOS (ICD-461.9) Assessment: Unchanged pt again w/ sinus infxn.  d/c keflex.  start amox. The following medications were removed from the medication list:    Doxycycline Hyclate 100 Mg Caps (Doxycycline hyclate) .Marland Kitchen... Take 1 tab twice a day.  take w/ food. Her updated medication list for this problem includes:    Cephalexin 500 Mg Tabs (  Cephalexin) .Marland Kitchen... Take one by mouth 2 times daily    Nasonex 50 Mcg/act Susp (Mometasone furoate) .Marland Kitchen... 2 sprays each nostril once daily    Amoxicillin 875 Mg Tabs (Amoxicillin) .Marland Kitchen... 1 tab by mouth two times a day x10 days  Problem # 3:  RHINITIS (ICD-477.9) Assessment: New pt's ongoing nasal congestion is likely partly responsible for recurrent sinus infxns.  start nasal spray to decrease congestion and PND. Her updated medication list for this problem includes:    Nasonex 50 Mcg/act Susp (Mometasone furoate) .Marland Kitchen... 2 sprays each nostril once  daily  Complete Medication List: 1)  Zolpidem Tartrate 10 Mg Tabs (Zolpidem tartrate) .... Take one tablet at bedtime 2)  Nupercainal 1 % Oint (Dibucaine) .... Apply once a day  as needed for pain/itching.  disp 60 grams 3)  Diltiazem Cream 2%  .... Apply to rectum q 2-3 hrs as needed 4)  Tramadol Hcl 50 Mg Tabs (Tramadol hcl) .Marland Kitchen.. 1 q 6-8 hrs as needed 5)  Colace 100 Mg Caps (Docusate sodium) .... Two times a day 6)  Levothyroxine Sodium 100 Mcg Tabs (Levothyroxine sodium) .Marland Kitchen.. 1 tab once daily 7)  Cephalexin 500 Mg Tabs (Cephalexin) .... Take one by mouth 2 times daily 8)  Nasonex 50 Mcg/act Susp (Mometasone furoate) .... 2 sprays each nostril once daily 9)  Amoxicillin 875 Mg Tabs (Amoxicillin) .Marland Kitchen.. 1 tab by mouth two times a day x10 days 10)  Naproxen 500 Mg Tabs (Naproxen) .Marland Kitchen.. 1 tab by mouth two times a day x5-7 days and then as needed.  take w/ food.  Patient Instructions: 1)  You have a sinus infection 2)  Take the Amoxicillin as directed- take w/ food to avoid upset stomach 3)  Start the nasal spray daily to decrease congestion and drainage 4)  Add a Claritin or Zyrtec if the allergies aren't controlled w/ nasal spray alone 5)  Take the Naproxen as directed for chest wall pain. 6)  Hang in there!!! Prescriptions: NAPROXEN 500 MG TABS (NAPROXEN) 1 tab by mouth two times a day x5-7 days and then as needed.  take w/ food.  #60 x 0   Entered and Authorized by:   Neena Rhymes MD   Signed by:   Neena Rhymes MD on 03/01/2010   Method used:   Electronically to        [facility] [phone]* (retail)       [address]       [place], [place]  [address]       Ph: [phone]       Fax: [phone]   RxID:   [id number] AMOXICILLIN 875 MG TABS (AMOXICILLIN) 1 tab by mouth two times a day x10 days  #20 x 0   Entered and Authorized by:   Neena Rhymes MD   Signed by:   Neena Rhymes MD on 03/01/2010   Method used:   Electronically to         [facility] [phone]* (retail)       [address]       [place], [place]  [address]       Ph: [phone]       Fax: [phone]   RxID:   [id number] NASONEX 50 MCG/ACT SUSP (MOMETASONE FUROATE) 2 sprays each nostril once daily  #1 x 3   Entered and Authorized by:   Neena Rhymes MD  Signed by:   Neena Rhymes MD on 03/01/2010   Method used:   Electronically to        [facility] [phone]* (retail)       [address]       [place], [place]  [address]       Ph: [phone]       Fax: [phone]   RxID:   [id number]    Orders Added: 1)  Est. Patient Level IV [24401]

## 2010-03-07 NOTE — Assessment & Plan Note (Signed)
Summary: bump in head    Vital Signs:  Patient profile:   40 year old female Height:      66 inches Weight:      184 pounds BMI:     29.81 Temp:     98.9 degrees F oral Pulse rate:   74 / minute BP sitting:   100 / 70  (left arm)  Vitals Entered By: Jeremy Johann CMA (February 22, 2010 2:22 PM) CC: pus filled bump in head   History of Present Illness: 40 yo woman here today w/ bump in head.  has had them previously.  this time it was pus filled- 'like lard'.  sister kept squeezing until area was flat.  area now painful.  has multiple other areas that are not painful and not draining.  stabbing headaches- unilateral, will occur frequently but briefly.  has been happening for years.  no nausea or vomiting.  no sensitivity to light or sound.  no focal weakness.  Current Medications (verified): 1)  Zolpidem Tartrate 10 Mg Tabs (Zolpidem Tartrate) .... Take One Tablet At Bedtime 2)  Nupercainal 1 % Oint (Dibucaine) .... Apply Once A Day  As Needed For Pain/itching.  Disp 60 Grams 3)  Diltiazem Cream 2% .... Apply To Rectum Q 2-3 Hrs As Needed 4)  Tramadol Hcl 50 Mg Tabs (Tramadol Hcl) .Marland Kitchen.. 1 Q 6-8 Hrs As Needed 5)  Colace 100 Mg Caps (Docusate Sodium) .... Two Times A Day 6)  Levothyroxine Sodium 100 Mcg Tabs (Levothyroxine Sodium) .Marland Kitchen.. 1 Tab Once Daily 7)  Doxycycline Hyclate 100 Mg Caps (Doxycycline Hyclate) .... Take 1 Tab Twice A Day.  Take W/ Food.  Allergies (verified): No Known Drug Allergies  Review of Systems      See HPI  Physical Exam  General:  Well-developed,well-nourished,in no acute distress; alert,appropriate and cooperative throughout examination Head:  normocephalic.  has multiple cysts in scalp, has infected lesion at R parieto-occipital jxn w/ easily expressable pus. Eyes:  No corneal or conjunctival inflammation noted. EOMI. Perrla. Funduscopic exam benign, without hemorrhages, exudates or papilledema. Vision grossly normal. Ears:  External ear exam shows  no significant lesions or deformities.  Otoscopic examination reveals clear canals, tympanic membranes are intact bilaterally without bulging, retraction, inflammation or discharge. Hearing is grossly normal bilaterally. Neurologic:  No cranial nerve deficits noted. Station and gait are normal. Plantar reflexes are down-going bilaterally. DTRs are symmetrical throughout. Sensory, motor and coordinative functions appear intact. Cervical Nodes:  No lymphadenopathy noted   Impression & Recommendations:  Problem # 1:  SWELLING MASS OR LUMP IN HEAD AND NECK (ICD-784.2) Assessment Deteriorated pt w/ multiple cysts on scalp but has 1 currently infected.  start Doxy to cover for MRSA.  send wound cx.  will refer to derm for additional tx. Orders: Specimen Handling ([address]) T-Culture, Wound ([id number]) Dermatology Referral (Derma)  Problem # 2:  HEADACHE (ICD-784.0) Assessment: New given that pt has been having these intermittant sxs for years will ask her to log sxs for next few weeks in order to have a better discussion about them.  will follow. Her updated medication list for this problem includes:    Tramadol Hcl 50 Mg Tabs (Tramadol hcl) .Marland Kitchen... 1 q 6-8 hrs as needed  Complete Medication List: 1)  Zolpidem Tartrate 10 Mg Tabs (Zolpidem tartrate) .... Take one tablet at bedtime 2)  Nupercainal 1 % Oint (Dibucaine) .... Apply once a day  as needed for pain/itching.  disp 60 grams 3)  Diltiazem Cream  2%  .... Apply to rectum q 2-3 hrs as needed 4)  Tramadol Hcl 50 Mg Tabs (Tramadol hcl) .Marland Kitchen.. 1 q 6-8 hrs as needed 5)  Colace 100 Mg Caps (Docusate sodium) .... Two times a day 6)  Levothyroxine Sodium 100 Mcg Tabs (Levothyroxine sodium) .Marland Kitchen.. 1 tab once daily 7)  Doxycycline Hyclate 100 Mg Caps (Doxycycline hyclate) .... Take 1 tab twice a day.  take w/ food. 8)  Cephalexin 500 Mg Tabs (Cephalexin) .... Take one by mouth 2 times daily  Patient Instructions: 1)  We'll call you with your  dermatology referral 2)  Take the Doxycyline for the infected cyst in your scalp 3)  Ibuprofen as needed for pain 4)  Keep a log of your headache sxs- when you get them, how long they last, what you are doing, what makes them better or worse, etc 5)  Follow up in 1-2 months to review headache log 6)  Call with any questions or concerns 7)  Hang in there!!! Prescriptions: DOXYCYCLINE HYCLATE 100 MG CAPS (DOXYCYCLINE HYCLATE) Take 1 tab twice a day.  take w/ food.  #20 x 0   Entered and Authorized by:   Neena Rhymes MD   Signed by:   Neena Rhymes MD on 02/22/2010   Method used:   Electronically to        [facility] [phone]* (retail)       [address]       [place], [place]  [address]       Ph: [phone]       Fax: [phone]   RxID:   [id number] DOXYCYCLINE HYCLATE 100 MG CAPS (DOXYCYCLINE HYCLATE) Take 1 tab twice a day.  take w/ food.  #20 x 0   Entered and Authorized by:   Neena Rhymes MD   Signed by:   Neena Rhymes MD on 02/22/2010   Method used:   Electronically to        [facility] [phone]* (retail)       [address]       [place], [place]  [address]       Ph: [phone]       Fax: [phone]   RxID:   [id number]    Orders Added: 1)  Specimen Handling [99000] 2)  T-Culture, Wound [[id number]] 3)  Dermatology Referral [Derma] 4)  Est. Patient Level III [32355]

## 2010-03-07 NOTE — Progress Notes (Signed)
Summary: med reaction  Phone Note Refill Request Call back at Work Phone [phone]   Refills Requested: Medication #1:  DOXYCYCLINE HYCLATE 100 MG CAPS Take 1 tab twice a day.  take w/ food.. Pt states that med is causing abdominal cramping and would like med to be changed. Pt note that she is taking med with food but cramming is still there. Pt uses [facility] [address]....Marland KitchenMarland KitchenFelecia Deloach CMA  February 25, 2010 11:20 AM    Follow-up for Phone Call        can switch to cephalexin 500mg  two times a day x7 days. Follow-up by: Neena Rhymes MD,  February 25, 2010 12:12 PM  Additional Follow-up for Phone Call Additional follow up Details #1::        Patient notified and re-sent to correct pharmacy. Additional Follow-up by: Lucious Groves CMA,  February 25, 2010 1:42 PM    New/Updated Medications: CEPHALEXIN 500 MG  TABS (CEPHALEXIN) take one by mouth 2 times daily Prescriptions: CEPHALEXIN 500 MG  TABS (CEPHALEXIN) take one by mouth 2 times daily  #14 x 0   Entered by:   Lucious Groves CMA   Authorized by:   Neena Rhymes MD   Signed by:   Lucious Groves CMA on 02/25/2010   Method used:   Electronically to        [facility] [phone]* (retail)       [address]       [place], [place]  [address]       Ph: [phone]       Fax: [phone]   RxID:   [id number] CEPHALEXIN 500 MG  TABS (CEPHALEXIN) take one by mouth 2 times daily  #14 x 0   Entered and Authorized by:   Neena Rhymes MD   Signed by:   Neena Rhymes MD on 02/25/2010   Method used:   Electronically to        [facility] [phone]* (retail)       [address]       [place], [place]  [address]       Ph: [phone]       Fax: [phone]   RxID:   [id number]

## 2010-03-26 ENCOUNTER — Telehealth: Payer: Self-pay | Admitting: Family Medicine

## 2010-04-02 ENCOUNTER — Ambulatory Visit (INDEPENDENT_AMBULATORY_CARE_PROVIDER_SITE_OTHER): Payer: 59 | Admitting: Family Medicine

## 2010-04-02 ENCOUNTER — Encounter: Payer: Self-pay | Admitting: Family Medicine

## 2010-04-02 DIAGNOSIS — J329 Chronic sinusitis, unspecified: Secondary | ICD-10-CM | POA: Insufficient documentation

## 2010-04-02 NOTE — Progress Notes (Signed)
Summary: Tested for mono  Phone Note Call from Patient Call back at Work Phone [phone]   Caller: Patient Summary of Call: Pt states that her son was just Dx with mono. Pt is not exhibiting any symptoms as of now but wants to know if she needed to come in to be tested. Pls advise......Marland KitchenFelecia Deloach CMA  March 26, 2010 12:44 PM   Follow-up for Phone Call        no.  chances are that she has already been exposed to and is immune to mono.  will only need to be tested or treated if she develops symptoms Follow-up by: Neena Rhymes MD,  March 26, 2010 12:57 PM  Additional Follow-up for Phone Call Additional follow up Details #1::        Pt aware......Marland KitchenFelecia Deloach CMA  March 26, 2010 4:06 PM

## 2010-04-09 NOTE — Assessment & Plan Note (Signed)
Summary: sinus issue, head congestion//fd   Vital Signs:  Patient profile:   40 year old female Height:      66 inches (167.64 cm) Weight:      184.13 pounds (83.70 kg) BMI:     29.83 Temp:     97.3 degrees F (36.28 degrees C) oral BP sitting:   90 / 60  (left arm)  Vitals Entered By: Lucious Groves CMA (April 02, 2010 11:39 AM) CC: Sinus issues./kb Is Patient Diabetic? No Pain Assessment Patient in pain? no      Comments Patient notes that she has been having congestion, nasal burning, SOB, and mucous production. Patient denies fever, HA, and cough.   History of Present Illness: 40 yo woman here today for sinus issues.  took left over keflex yesterday w/ improvement in HA, body aches, sinus pressure.  using ibuprofen for pain relief.  sxs started Saturday.  denies fever.  using nasal spray.  this is recurrent problem for pt.  using nasonex daily  Current Medications (verified): 1)  Zolpidem Tartrate 10 Mg Tabs (Zolpidem Tartrate) .... Take One Tablet At Bedtime 2)  Nupercainal 1 % Oint (Dibucaine) .... Apply Once A Day  As Needed For Pain/itching.  Disp 60 Grams 3)  Diltiazem Cream 2% .... Apply To Rectum Q 2-3 Hrs As Needed 4)  Tramadol Hcl 50 Mg Tabs (Tramadol Hcl) .Marland Kitchen.. 1 Q 6-8 Hrs As Needed 5)  Colace 100 Mg Caps (Docusate Sodium) .... Two Times A Day 6)  Levothyroxine Sodium 100 Mcg Tabs (Levothyroxine Sodium) .Marland Kitchen.. 1 Tab Once Daily 7)  Nasonex 50 Mcg/act Susp (Mometasone Furoate) .... 2 Sprays Each Nostril Once Daily 8)  Amoxicillin 875 Mg Tabs (Amoxicillin) .Marland Kitchen.. 1 Tab By Mouth Two Times A Day X10 Days 9)  Naproxen 500 Mg Tabs (Naproxen) .Marland Kitchen.. 1 Tab By Mouth Two Times A Day X5-7 Days and Then As Needed.  Take W/ Food.  Allergies (verified): No Known Drug Allergies  Review of Systems      See HPI  Physical Exam  General:  Well-developed,well-nourished,in no acute distress; alert,appropriate and cooperative throughout examination Head:  NCAT, minimal TTP over maxillary  sinuses Eyes:  no injxn or inflammation Ears:  External ear exam shows no significant lesions or deformities.  Otoscopic examination reveals clear canals, tympanic membranes are intact bilaterally without bulging, retraction, inflammation or discharge. Hearing is grossly normal bilaterally. Nose:  marked turbinate edema Mouth:  Oral mucosa and oropharynx without lesions or exudates.  Teeth in good repair. + PND Neck:  No deformities, masses, or tenderness noted. Lungs:  Normal respiratory effort, chest expands symmetrically.  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  will finish out 10 day course and refer to ENT given recurrent infxns.  Pt expresses understanding and is in agreement w/ this plan. The following medications were removed from the medication list:    Amoxicillin 875 Mg Tabs (Amoxicillin) .Marland Kitchen... 1 tab by mouth two times a day x10 days Her updated medication list for this problem includes:    Nasonex 50 Mcg/act Susp (Mometasone furoate) .Marland Kitchen... 2 sprays each nostril once daily    Cephalexin 500 Mg Tabs (Cephalexin) .Marland Kitchen... Take one by mouth 2 times daily  Orders: ENT Referral (ENT)  Complete Medication List: 1)  Zolpidem Tartrate 10 Mg Tabs (Zolpidem tartrate) .... Take one tablet at bedtime 2)  Nupercainal 1 % Oint (Dibucaine) .... Apply once  a day  as needed for pain/itching.  disp 60 grams 3)  Diltiazem Cream 2%  .... Apply to rectum q 2-3 hrs as needed 4)  Tramadol Hcl 50 Mg Tabs (Tramadol hcl) .Marland Kitchen.. 1 q 6-8 hrs as needed 5)  Colace 100 Mg Caps (Docusate sodium) .... Two times a day 6)  Levothyroxine Sodium 100 Mcg Tabs (Levothyroxine sodium) .Marland Kitchen.. 1 tab once daily 7)  Nasonex 50 Mcg/act Susp (Mometasone furoate) .... 2 sprays each nostril once daily 8)  Naproxen 500 Mg Tabs (Naproxen) .Marland Kitchen.. 1 tab by mouth two times a day x5-7 days and then as needed.  take w/  food. 9)  Cephalexin 500 Mg Tabs (Cephalexin) .... Take one by mouth 2 times daily  Patient Instructions: 1)  We will call you with your ENT appt 2)  Continue the antibiotics and your nasal spray 3)  Call with any questions or concerns 4)  Hang in there!!! Prescriptions: CEPHALEXIN 500 MG  TABS (CEPHALEXIN) take one by mouth 2 times daily  #14 x 0   Entered and Authorized by:   Neena Rhymes MD   Signed by:   Neena Rhymes MD on 04/02/2010   Method used:   Electronically to        [facility] [phone]* (retail)       [address]       [place], [place]  [address]       Ph: [phone]       Fax: [phone]   RxID:   [id number]    Orders Added: 1)  ENT Referral [ENT] 2)  Est. Patient Level III [[address]]

## 2010-06-04 NOTE — Discharge Summary (Signed)
Karen Merritt, Karen Merritt               ACCOUNT NO.:  [id number]   MEDICAL RECORD NO.:  [id number]          PATIENT TYPE:  OIB   LOCATION:  9313                          FACILITY:  [facility]   PHYSICIAN:  Ilda Mori, M.D.   DATE OF BIRTH:  1970/07/14   DATE OF ADMISSION:  08/07/2006  DATE OF DISCHARGE:  08/08/2006                               DISCHARGE SUMMARY   ADMITTING DIAGNOSIS:  Dysmenorrhea.   SECONDARY DIAGNOSES:  None.   PROCEDURE:  Diagnostic laparoscopy, transvaginal hysterectomy.   COMPLICATIONS:  None.   CONDITION ON DISCHARGE:  Improved.   This is a 40 year old gravida 2, para 2 female who noted increasingly  severe dysmenorrhea over the last two years.  Attempts to control this  condition with hormonal medications failed, and the decision was made to  proceed with hysterectomy.  She was brought to the operating room on the  day of admission where a diagnostic laparoscopy and a transvaginal  hysterectomy was performed, which revealed small, possible  endometriotic, implants in the cul de sac and on the right ovary .  The  surgery went without complications.  The patient's postoperative  observation was benign.  On the morning of the first postoperative day,  the patient was afebrile.  Her vital signs were stable.  She was  tolerating a regular diet.  Her pain was controlled with oral pain  medications, and she was felt to be ready for discharge.   She was discharged on a regular diet, told to limit her activity.  She  was given Tylox 25 tablets to take 1-2 every 4 hours for pain.  She was  encouraged to take Naprosyn 450 mg every 8 hours, also for pain control,  and she was asked to call the office and make a follow-up appointment in  four weeks.   Her laboratory data showed an admission hemoglobin of 13.4 with a white  count of 8300.  Her platelet count was 85,000.  Repeat platelet count  was 224,000.  Her postoperative hemoglobin was 11.4, and her  postoperative  white count was 12,000.      Ilda Mori, M.D.  Electronically Signed     RK/MEDQ  D:  08/08/2006  T:  08/08/2006  Job:  [id number]

## 2010-06-04 NOTE — H&P (Signed)
NAMECHARMA, MOCARSKI               ACCOUNT NO.:  [id number]   MEDICAL RECORD NO.:  [id number]          PATIENT TYPE:  AMB   LOCATION:  SDC                           FACILITY:  [facility]   PHYSICIAN:  Ilda Mori, M.D.   DATE OF BIRTH:  Jul 31, 1970   DATE OF ADMISSION:  08/07/2006  DATE OF DISCHARGE:                              HISTORY & PHYSICAL   ADMISSION DIAGNOSIS:  Severe dysmenorrhea.   HISTORY OF PRESENT ILLNESS:  This is a 40 year old gravida 2, para 2  female who has had approximately three years of moderate-to-severe  dysmenorrhea.  The patient had painful periods since the onset of her  menses, but these improved after childbirth.  Her last delivery was a  vaginal delivery in 1998.  The patient noted approximately 2-3 years ago  recurrence of her painful periods.  At that time, various conservative  modalities were used, including oral contraceptives, nonsteroidal anti-  inflammatory drugs, and hydrocodone.  The patient felt that the painful  periods were not responding to these modalities and treatment, and the  decision was made to proceed with vaginal hysterectomy.  Because of the  possibility that this pain represents endometriosis, a laparoscopic  laparoscopy will be done prior to the hysterectomy to rule out adhesions  and/or endometriosis.   PAST MEDICAL HISTORY:  The patient had no drug allergies.  She has  hypothyroidism and is actually status post thyroidectomy for Graves  disease, and she is on 150 mcg of Synthroid thyroid replacement.  She  has no other medical problems other than chronic sinusitis.  Previous  surgeries include thyroidectomy.  She has no psychological problems.   SOCIAL HISTORY:  She does not smoke.  She does not use alcohol.  She  does not use recreational drugs.  She is divorced, has two children, and  works as an Systems developer.   Her family history is positive for hypertension in her mother and  diabetes in her grandmother.   Her review of  systems is negative in detail.   PHYSICAL EXAMINATION:  VITAL SIGNS:  She is 5 feet, 6-1/2 inches tall,  194 pounds.  Blood pressure 118/60.  HEENT:  Normal.  NECK:  Supple without masses.  There is no lymphadenopathy palpable.  HEART:  Regular sinus rhythm without murmurs or gallops.  LUNGS:  Clear to auscultation and percussion.  ABDOMEN:  Soft without hepatosplenomegaly.  BREASTS:  Without masses.  PELVIC:  Normal external genitalia.  Her vagina and cervix appear  normal.  Her uterus and adnexa are without palpable abnormalities.   ADMISSION DIAGNOSIS:  Severe dysmenorrhea, unresponsive to medical  treatment.   PLAN:  A diagnostic laparoscopy, possible unilateral oophorectomy and  transvaginal hysterectomy.      Ilda Mori, M.D.  Electronically Signed     RK/MEDQ  D:  08/05/2006  T:  08/05/2006  Job:  [id number]

## 2010-06-04 NOTE — Op Note (Signed)
Karen Merritt, Karen Merritt               ACCOUNT NO.:  [id number]   MEDICAL RECORD NO.:  [id number]          PATIENT TYPE:  OIB   LOCATION:  9313                          FACILITY:  [facility]   PHYSICIAN:  Ilda Mori, M.D.   DATE OF BIRTH:  17-Sep-1970   DATE OF PROCEDURE:  08/07/2006  DATE OF DISCHARGE:                               OPERATIVE REPORT   PREOPERATIVE DIAGNOSIS:  Severe dysmenorrhea.   POSTOPERATIVE DIAGNOSIS:  Severe dysmenorrhea and possible  endometriosis.   PROCEDURE:  Diagnostic laparoscopy, transvaginal hysterectomy.   SURGEON:  Dr. Ilda Mori.   ASSISTANT:  Dr. Lodema Hong.   ANESTHESIA:  Was general.   ESTIMATED BLOOD LOSS:  Was 20 mL.   SPECIMENS TO PATHOLOGY:  Were uterus and cervix.   COMPLICATIONS:  Were none.   FINDINGS:  The uterus was enlarged.  No definite myoma were noted.  The  ovaries appeared normal bilaterally.  There were small hemorrhagic areas  approximately 1-2 mm that appeared to be consistent with endometrial  implants.  These areas were cauterized during the laparoscopy.  There  was also a small area that could represent an endometrial implant on the  posterior peritoneum in the cul-de-sac, and this was also cauterized.  The rest the pelvis was perfectly free.  The tubes were without  adhesions, and the pelvis and bowel appeared to be for the most part  entirely free of adhesions or inflammation associated with  endometriosis.   INDICATIONS:  This is a 40 year old female who presented to the office  with over a year of severe painful periods.  The patient had been tried  on oral contraceptives without significant relief.  Other options were  discussed with the patient who concluded that a hysterectomy was the  procedure that she felt would be of most benefit to her.   PROCEDURE:  The patient was taken to the operating room, placed in the  supine position, and general endotracheal anesthesia was induced.  She  was then placed in the  dorsal lithotomy position, and the abdomen,  vagina and perineum were prepped and draped in sterile fashion.  The  bladder was catheterized.  The incision was made at the base of the  umbilicus, and a Veress needle was introduced into the peritoneal  cavity, and a pneumoperitoneum was created.  The 5-mm trocar was  introduced into the pneumoperitoneum, and 5-mm scope was then placed.  Under direct visualization, accessory instruments were placed through a  suprapubic stab wound.  The pelvis was viewed with the findings noted  above.  The procedure was then turned vaginally.  The weighted speculum  was placed in the vagina.  The cervix was grasped with a Christella Hartigan  tenaculum, and the paracervical tissues were infiltrated with dilute  epinephrine and Marcaine solution.  The vaginal mucosa was trimmed back.  The cul-de-sac was entered sharply.  The uterosacral ligaments were  grasped bilaterally with a Heaney clamps, and a fixation suture was used  in both pedicles.  Using the gyrus cautery clamp, the remainder of the  cardinal ligaments and the base of the broad ligament  were clamped,  cauterized and cut.  The uterus was then delivered posteriorly, and the  remainder of the broad ligament and the uterine ovarian anastomoses were  then grasped, cauterized and cut until the specimen was free.  Hemostasis was noted to be present.  The posterior vaginal cuff was  closed with a running interlocking Vicryl 1 suture.  The peritoneum was  then closed with a pursestring suture.  The uterosacral ligaments that  had been tied at start if the procedure were then ligated across the  midline.  In addition, prior to that, a cul-de-sac suture was placed  from uterosacral to uterosacral through the cul-de-sac, and this was  also tied.  The anterior cuff was then closed with figure-of-eight  suture.  The pneumoperitoneum was then recreated after the surgeon  regloved.  The pedicles were evaluated and seen to be  dry, and the  procedure was then terminated.  The laparoscopic incisions were closed  with Dermabond.  The patient tolerated the procedure well and left the  operating room in good condition.      Ilda Mori, M.D.  Electronically Signed     RK/MEDQ  D:  08/07/2006  T:  08/07/2006  Job:  [id number]

## 2010-06-07 ENCOUNTER — Encounter: Payer: Self-pay | Admitting: Family Medicine

## 2010-06-07 ENCOUNTER — Encounter: Payer: Self-pay | Admitting: *Deleted

## 2010-08-22 ENCOUNTER — Ambulatory Visit: Payer: Self-pay | Admitting: Endocrinology

## 2010-09-17 ENCOUNTER — Encounter: Payer: Self-pay | Admitting: Endocrinology

## 2010-09-17 ENCOUNTER — Other Ambulatory Visit (INDEPENDENT_AMBULATORY_CARE_PROVIDER_SITE_OTHER): Payer: 59

## 2010-09-17 ENCOUNTER — Ambulatory Visit (INDEPENDENT_AMBULATORY_CARE_PROVIDER_SITE_OTHER): Payer: 59 | Admitting: Endocrinology

## 2010-09-17 DIAGNOSIS — E890 Postprocedural hypothyroidism: Secondary | ICD-10-CM

## 2010-09-17 DIAGNOSIS — E042 Nontoxic multinodular goiter: Secondary | ICD-10-CM

## 2010-09-17 LAB — TSH: TSH: 1.65 u[IU]/mL (ref 0.35–5.50)

## 2010-09-17 NOTE — Patient Instructions (Addendum)
blood tests are being requested for you today.  please call [phone] to hear your test results.  You will be prompted to enter the 9-digit "MRN" number that appears at the top left of this page, followed by #.  Then you will hear the message. pending the test results, please continue the same medications for now.  However, there is a chance that the goiter could re-grow.  You would first notice this a decrease in your requirement for levothyroxine. Please return in 1 year.  Or if you wish, you could have dr Beverely Low check this each year, and come back here as needed.

## 2010-09-17 NOTE — Progress Notes (Signed)
  Subjective:    Patient ID: Karen Merritt, female    DOB: [birth year], 40 y.o.   MRN: [id number]  HPI pt had thyroidectomy in 1998 due to the mass effect of a multinodular goiter.  she takes synthroid 100 micrograms/day, as rx'ed.  despite the decrease of her synthroid, she has persistent palpitations in the chest.  pt states she feels well in general.  She denies sob. Past Medical History  Diagnosis Date  . Hypothyroidism   . Chest pain     non-cardiac 2005, 2012.   Past Surgical History  Procedure Date  . Thyroidectomy 12/29/96  . Stress cardiolite 05/23/03  . Total abdominal hysterectomy 2008  . Bone spur 6/11    right big toe    History   Social History  . Marital Status: Divorced    Spouse Name: N/A    Number of Children: N/A  . Years of Education: N/A   Occupational History  . Not on file.   Social History Main Topics  . Smoking status: Never Smoker   . Smokeless tobacco: Not on file  . Alcohol Use: No  . Drug Use: Not on file  . Sexually Active:    Other Topics Concern  . Not on file   Social History Narrative  . No narrative on file    Current Outpatient Prescriptions on File Prior to Visit  Medication Sig Dispense Refill  . AMBULATORY NON FORMULARY MEDICATION Diltiazem Cream 2% - apply to rectum q 2-3 hrs, prn       . dibucaine (NUPERCAINAL) 1 % ointment Apply topically daily as needed.        . docusate sodium (COLACE) 100 MG capsule Take 100 mg by mouth 2 (two) times daily.        Marland Kitchen levothyroxine (SYNTHROID, LEVOTHROID) 100 MCG tablet Take 100 mcg by mouth daily.        . naproxen (NAPROSYN) 500 MG tablet Take 500 mg by mouth 2 (two) times daily with a meal.        . traMADol (ULTRAM) 50 MG tablet Take 50 mg by mouth every 6 (six) hours as needed. Every 6-8 hrs prn       . zolpidem (AMBIEN) 10 MG tablet Take 10 mg by mouth at bedtime as needed.          No Known Allergies  Family History  Problem Relation Age of Onset  . Bone cancer      aunt  .  Thyroid disease      aunt  . Liver cancer      aunt  . Hypertension Mother   . Hypertension Maternal Grandmother   . Diabetes Maternal Grandmother   . Coronary artery disease Maternal Grandfather   . Coronary artery disease Maternal Uncle   . Hypertension Maternal Uncle   . Colon polyps Mother     BP 100/72  Pulse 64  Temp(Src) 98.4 F (36.9 C) (Oral)  Ht 5\' 6"  (1.676 m)  Wt 192 lb 6.4 oz (87.272 kg)  BMI 31.05 kg/m2  SpO2 97%  Review of Systems Denies dysphagia    Objective:   Physical Exam VITAL SIGNS:  See vs page GENERAL: no distress Neck: a healed scar is present.  i do not appreciate a nodule in the thyroid or elsewhere in the neck     Assessment & Plan:  Postsurgical hypothyroidism, on synthroid.

## 2010-10-14 ENCOUNTER — Other Ambulatory Visit: Payer: Self-pay | Admitting: Endocrinology

## 2010-10-30 ENCOUNTER — Ambulatory Visit (INDEPENDENT_AMBULATORY_CARE_PROVIDER_SITE_OTHER): Payer: 59 | Admitting: Family Medicine

## 2010-10-30 ENCOUNTER — Encounter: Payer: Self-pay | Admitting: Family Medicine

## 2010-10-30 DIAGNOSIS — E7800 Pure hypercholesterolemia, unspecified: Secondary | ICD-10-CM

## 2010-10-30 DIAGNOSIS — E890 Postprocedural hypothyroidism: Secondary | ICD-10-CM

## 2010-10-30 DIAGNOSIS — Z0000 Encounter for general adult medical examination without abnormal findings: Secondary | ICD-10-CM | POA: Insufficient documentation

## 2010-10-30 DIAGNOSIS — R32 Unspecified urinary incontinence: Secondary | ICD-10-CM | POA: Insufficient documentation

## 2010-10-30 LAB — CBC WITH DIFFERENTIAL/PLATELET
Basophils Absolute: 0 10*3/uL (ref 0.0–0.1)
HCT: 39.5 % (ref 36.0–46.0)
Lymphocytes Relative: 37.7 % (ref 12.0–46.0)
Lymphs Abs: 2.8 10*3/uL (ref 0.7–4.0)
Monocytes Relative: 7.6 % (ref 3.0–12.0)
Neutrophils Relative %: 52.7 % (ref 43.0–77.0)
Platelets: 227 10*3/uL (ref 150.0–400.0)
RDW: 14.7 % — ABNORMAL HIGH (ref 11.5–14.6)

## 2010-10-30 LAB — HEPATIC FUNCTION PANEL
Alkaline Phosphatase: 65 U/L (ref 39–117)
Bilirubin, Direct: 0 mg/dL (ref 0.0–0.3)
Total Protein: 7.7 g/dL (ref 6.0–8.3)

## 2010-10-30 LAB — BASIC METABOLIC PANEL
CO2: 27 mEq/L (ref 19–32)
Calcium: 8.9 mg/dL (ref 8.4–10.5)
Sodium: 141 mEq/L (ref 135–145)

## 2010-10-30 LAB — LIPID PANEL
HDL: 37.6 mg/dL — ABNORMAL LOW (ref >39.00)
Total CHOL/HDL Ratio: 5
VLDL: 17 mg/dL (ref 0.0–40.0)

## 2010-10-30 NOTE — Assessment & Plan Note (Signed)
This is new for pt since TAH.  Given her young age and severity of sxs will refer to urology.  Pt expressed understanding and is in agreement w/ plan.

## 2010-10-30 NOTE — Assessment & Plan Note (Signed)
Has been following w/ Dr Everardo All, is considering having labs checked w/ me q6 months and having meds adjusted here.  This is her decision.

## 2010-10-30 NOTE — Patient Instructions (Signed)
Schedule your 6 months follow up for cholesterol We'll notify you of your lab results and your urology appt Keep up the good work!  You look great! Call with any questions or concerns Happy Holidays!

## 2010-10-30 NOTE — Progress Notes (Signed)
  Subjective:    Patient ID: Karen Merritt, female    DOB: 12/01/1970, 40 y.o.   MRN: [id number]  HPI CPE- UTD on pap and mammo.  UTD on colonoscopy.    Urinary incontinence- since surgery (TAH) this summer has struggled w/ feeling of urgency, frequency, stress incontinence.  Had none of these problems prior.  'i'm too young to wear depends'  Will have suprapubic pain/cramping intermittently.  Will also have sensation that she needs to urinate but then very little comes out.  Review of Systems Patient reports no vision/ hearing changes, adenopathy,fever, weight change,  persistant/recurrent hoarseness , swallowing issues, chest pain, palpitations, edema, persistant/recurrent cough, hemoptysis, dyspnea (rest/exertional/paroxysmal nocturnal), gastrointestinal bleeding (melena, rectal bleeding), abdominal pain, significant heartburn, bowel changes, Gyn symptoms (abnormal  bleeding, pain),  syncope, focal weakness, memory loss, numbness & tingling, skin/hair/nail changes, abnormal bruising or bleeding, anxiety, or depression.     Objective:   Physical Exam  General Appearance:    Alert, cooperative, no distress, appears stated age  Head:    Normocephalic, without obvious abnormality, atraumatic  Eyes:    PERRL, conjunctiva/corneas clear, EOM's intact, fundi    benign, both eyes  Ears:    Normal TM's and external ear canals, both ears  Nose:   Nares normal, septum midline, mucosa normal, no drainage    or sinus tenderness  Throat:   Lips, mucosa, and tongue normal; teeth and gums normal  Neck:   Supple, symmetrical, trachea midline, no adenopathy;    Thyroid: no enlargement/tenderness/nodules  Back:     Symmetric, no curvature, ROM normal, no CVA tenderness  Lungs:     Clear to auscultation bilaterally, respirations unlabored  Chest Wall:    No tenderness or deformity   Heart:    Regular rate and rhythm, S1 and S2 normal, no murmur, rub   or gallop  Breast Exam:    Deferred to GYN  Abdomen:      Soft, non-tender, bowel sounds active all four quadrants,    no masses, no organomegaly  Genitalia:    Deferred to GYN  Rectal:    Extremities:   Extremities normal, atraumatic, no cyanosis or edema  Pulses:   2+ and symmetric all extremities  Skin:   Skin color, texture, turgor normal, no rashes or lesions  Lymph nodes:   Cervical, supraclavicular, and axillary nodes normal  Neurologic:   CNII-XII intact, normal strength, sensation and reflexes    throughout          Assessment & Plan:

## 2010-10-30 NOTE — Assessment & Plan Note (Signed)
Check labs.  Start meds prn. 

## 2010-10-30 NOTE — Assessment & Plan Note (Signed)
Pt's PE WNL.  UTD on health maintenance.  Check labs.  Anticipatory guidance provided.  

## 2010-10-31 NOTE — Progress Notes (Signed)
Quick Note:    Labs mailed.  ______

## 2010-11-01 ENCOUNTER — Encounter: Payer: Self-pay | Admitting: *Deleted

## 2010-11-01 LAB — VITAMIN D 1,25 DIHYDROXY: Vitamin D 1, 25 (OH)2 Total: 48 pg/mL (ref 18–72)

## 2010-11-04 LAB — CBC
HCT: 40.7
Hemoglobin: 13.4
MCHC: 32.8
MCV: 80.1
Platelets: 224
Platelets: 85 — ABNORMAL LOW
RBC: 5.09
RDW: 14
WBC: 12 — ABNORMAL HIGH
WBC: 8.3

## 2010-11-08 ENCOUNTER — Telehealth: Payer: Self-pay | Admitting: *Deleted

## 2010-11-08 NOTE — Telephone Encounter (Signed)
Pt left vm for lab results, called pt back on work number as requested, advised to call office back, lab results have been mailed

## 2010-11-08 NOTE — Telephone Encounter (Signed)
Call from patient and she wanted to discuss cholesterol results. I advise that she could try fish oil and flax seed oil and I would mail a copy of diet information to help increase the HDL and decrease the LDL and  She agreed    KP

## 2011-01-17 ENCOUNTER — Encounter: Payer: Self-pay | Admitting: Family Medicine

## 2011-01-17 ENCOUNTER — Ambulatory Visit (INDEPENDENT_AMBULATORY_CARE_PROVIDER_SITE_OTHER): Payer: 59 | Admitting: Family Medicine

## 2011-01-17 ENCOUNTER — Telehealth: Payer: Self-pay | Admitting: Family Medicine

## 2011-01-17 VITALS — BP 110/70 | HR 69 | Temp 98.2°F | Ht 66.5 in | Wt 198.8 lb

## 2011-01-17 DIAGNOSIS — J329 Chronic sinusitis, unspecified: Secondary | ICD-10-CM

## 2011-01-17 MED ORDER — CLARITHROMYCIN ER 500 MG PO TB24: 1000.0000 mg | ORAL_TABLET | Freq: Every day | ORAL | Status: AC

## 2011-01-17 NOTE — Patient Instructions (Signed)
This is likely an early sinus infection Start the Biaxin as directed- 2 tabs at the same time, w/ food Mucinex to thin congestion Drink plenty of fluids! REST! Hang in there!!!

## 2011-01-17 NOTE — Telephone Encounter (Signed)
I prescribed the 500mg  XL- not sure what the issue is.  Please call pharmacy.

## 2011-01-17 NOTE — Assessment & Plan Note (Signed)
Pt's current sxs consistent w/ previous sxs.  Start abx.  Reviewed supportive care and red flags that should prompt return.  Pt expressed understanding and is in agreement w/ plan.

## 2011-01-17 NOTE — Telephone Encounter (Signed)
OV today 01-17-11

## 2011-01-17 NOTE — Progress Notes (Signed)
  Subjective:    Patient ID: Karen Merritt, female    DOB: 1970-02-10, 40 y.o.   MRN: [id number]  HPI Sinus infxn- sxs started Tuesday.  Taking ibuprofen.  + facial pain/pressure.  Bilateral ear fullness.  No cough, no fever.  No known sick contacts.   Review of Systems For ROS see HPI     Objective:   Physical Exam  Vitals reviewed. Constitutional: She appears well-developed and well-nourished. No distress.  HENT:  Head: Normocephalic and atraumatic.  Right Ear: Tympanic membrane normal.  Left Ear: Tympanic membrane normal.  Nose: Mucosal edema and rhinorrhea present. Right sinus exhibits maxillary sinus tenderness and frontal sinus tenderness. Left sinus exhibits maxillary sinus tenderness and frontal sinus tenderness.  Mouth/Throat: Uvula is midline and mucous membranes are normal. Posterior oropharyngeal erythema present. No oropharyngeal exudate.  Eyes: Conjunctivae and EOM are normal. Pupils are equal, round, and reactive to light.  Neck: Normal range of motion. Neck supple.  Cardiovascular: Normal rate, regular rhythm and normal heart sounds.   Pulmonary/Chest: Effort normal and breath sounds normal. No respiratory distress. She has no wheezes.  Lymphadenopathy:    She has no cervical adenopathy.          Assessment & Plan:

## 2011-01-17 NOTE — Telephone Encounter (Signed)
Called pharmacy and advised they DO NOT have the XR Bioxin noted, MD Tabori spoke to pharmacist based on the conversion the verbal order from XR was given to pharm tech to give pt rx of Bioxin 500mg  Tablets: Take TWO TABLETS BID X 10DAYS, pt is aware per came into office to advise of change needed and was on her way back to pharmacy while making the call to verify new order

## 2011-01-22 ENCOUNTER — Telehealth: Payer: Self-pay | Admitting: *Deleted

## 2011-01-22 MED ORDER — DIBUCAINE 1 % EX OINT: TOPICAL_OINTMENT | Freq: Every day | CUTANEOUS | Status: DC | PRN

## 2011-01-22 MED ORDER — AMOXICILLIN 875 MG PO TABS: 875.0000 mg | ORAL_TABLET | Freq: Two times a day (BID) | ORAL | Status: DC

## 2011-01-22 NOTE — Telephone Encounter (Signed)
If her sxs have improved, there is no need for additional abx.  If she still feels she has a sinus infection, we can switch to amoxicillin 875mg  bid x5 days to complete a 10 day course

## 2011-01-22 NOTE — Telephone Encounter (Signed)
Spoke to pt and she advised that her s/s have continued sent Amoxacillin 875mg  BID x 5 days to [facility] on [address], pt request for ointment to be sent, sent noted ointment for nupracainal however pt advised the ointment is different than the nupr. Pt advised she would tell pharmacy not to fill the ointment for her and that she will call in ointment when she gets back home to read label per not in her medication list.pt understood

## 2011-01-22 NOTE — Telephone Encounter (Signed)
Pt left vm to advise the new ABT Biaxin is upsetting her stomach at night, she is taking the medication with her meal but it still upsets her stomach as well as she noted pimple like bumps coming up around her nose so she stopped taking the medication, can something else be called in to [facility] on [address]?

## 2011-01-22 NOTE — Telephone Encounter (Signed)
.  left message to have patient return my call. On both home and cell phone

## 2011-01-22 NOTE — Telephone Encounter (Signed)
Addended by: Derry Lory A on: 01/22/2011 05:01 PM   Modules accepted: Orders

## 2011-02-21 ENCOUNTER — Ambulatory Visit (INDEPENDENT_AMBULATORY_CARE_PROVIDER_SITE_OTHER): Payer: 59 | Admitting: Family Medicine

## 2011-02-21 ENCOUNTER — Encounter: Payer: Self-pay | Admitting: Family Medicine

## 2011-02-21 VITALS — BP 109/70 | HR 99 | Temp 98.7°F | Ht 66.0 in | Wt 200.2 lb

## 2011-02-21 DIAGNOSIS — J069 Acute upper respiratory infection, unspecified: Secondary | ICD-10-CM

## 2011-02-21 MED ORDER — SELENIUM SULFIDE 2.5 % EX LOTN: TOPICAL_LOTION | Freq: Every day | CUTANEOUS | Status: DC | PRN

## 2011-02-21 NOTE — Patient Instructions (Signed)
This is a virus and will improve w/ time Ibuprofen as needed for sore throat Sudafed as needed congestion or drainage Drink plenty of fluids Hang in there!!!

## 2011-02-21 NOTE — Assessment & Plan Note (Signed)
No evidence of bacterial infection, no abx.  Most likely viral.  Reviewed supportive care and red flags that should prompt return.  Pt expressed understanding and is in agreement w/ plan.

## 2011-02-21 NOTE — Progress Notes (Signed)
  Subjective:    Patient ID: Karen Merritt, female    DOB: 05-Jul-1970, 41 y.o.   MRN: [id number]  HPI sxs started yesterday w/ PND, sore throat.  'i felt like crap'.  Used nyquil for sleep.  No fever overnight.  No cough, no facial pressure.  Nose feels 'raw'.  + sick contacts.   Review of Systems For ROS see HPI     Objective:   Physical Exam  Vitals reviewed. Constitutional: She appears well-developed and well-nourished. No distress.  HENT:  Head: Normocephalic and atraumatic.  Right Ear: Tympanic membrane normal.  Left Ear: Tympanic membrane normal.  Nose: Mucosal edema and rhinorrhea present. Right sinus exhibits no maxillary sinus tenderness and no frontal sinus tenderness. Left sinus exhibits no maxillary sinus tenderness and no frontal sinus tenderness.  Mouth/Throat: Mucous membranes are normal. Posterior oropharyngeal erythema (w/ PND) present.  Eyes: Conjunctivae and EOM are normal. Pupils are equal, round, and reactive to light.  Neck: Normal range of motion. Neck supple.  Cardiovascular: Normal rate, regular rhythm and normal heart sounds.   Pulmonary/Chest: Effort normal and breath sounds normal. No respiratory distress. She has no wheezes. She has no rales.  Lymphadenopathy:    She has no cervical adenopathy.          Assessment & Plan:

## 2011-04-04 ENCOUNTER — Telehealth: Payer: Self-pay | Admitting: *Deleted

## 2011-04-04 MED ORDER — TRAMADOL HCL 50 MG PO TABS: 50.0000 mg | ORAL_TABLET | Freq: Four times a day (QID) | ORAL | Status: AC | PRN

## 2011-04-04 NOTE — Telephone Encounter (Signed)
Ok for Nupercainal TID prn.

## 2011-04-04 NOTE — Telephone Encounter (Signed)
Pt would like to get a pain med. Pt is already using the Nupercainal ointment which does help but at times it doesn't like the instance when she was awaken from her sleep. Marland KitchenPlease advise

## 2011-04-04 NOTE — Telephone Encounter (Signed)
Pt states that she has a history of rectal spasm and usually uses the NUPERCAINAL ointment to help. Pt notes that on last night during one of her spasms it awoke her from her sleep in extreme pain. Pt would like to know if it would be possible to Rx her something for the pain that she can have on handle if she needs it.Please advise    Pt uses [facility] [address]

## 2011-04-04 NOTE — Telephone Encounter (Signed)
Per Dr Laury Axon ok ultram #30 1 every 6 hours prn. Rx sent to pharmacy left Pt detail message.

## 2011-04-15 ENCOUNTER — Ambulatory Visit (INDEPENDENT_AMBULATORY_CARE_PROVIDER_SITE_OTHER): Payer: 59 | Admitting: Internal Medicine

## 2011-04-15 ENCOUNTER — Ambulatory Visit (HOSPITAL_BASED_OUTPATIENT_CLINIC_OR_DEPARTMENT_OTHER)
Admission: RE | Admit: 2011-04-15 | Discharge: 2011-04-15 | Disposition: A | Discharge status: Home / Self Care | Payer: 59 | Source: Ambulatory Visit | Attending: Internal Medicine | Admitting: Internal Medicine

## 2011-04-15 DIAGNOSIS — R109 Unspecified abdominal pain: Secondary | ICD-10-CM | POA: Insufficient documentation

## 2011-04-15 DIAGNOSIS — D1803 Hemangioma of intra-abdominal structures: Secondary | ICD-10-CM

## 2011-04-15 DIAGNOSIS — D1809 Hemangioma of other sites: Secondary | ICD-10-CM | POA: Insufficient documentation

## 2011-04-15 LAB — POCT URINALYSIS DIPSTICK
Bilirubin, UA: NEGATIVE
Glucose, UA: NEGATIVE
Leukocytes, UA: NEGATIVE
Nitrite, UA: NEGATIVE
Urobilinogen, UA: 0.2

## 2011-04-15 MED ORDER — DICYCLOMINE HCL 10 MG PO CAPS: 10.0000 mg | ORAL_CAPSULE | Freq: Three times a day (TID) | ORAL | Status: DC

## 2011-04-15 MED ORDER — ZOLPIDEM TARTRATE 10 MG PO TABS: 10.0000 mg | ORAL_TABLET | Freq: Every evening | ORAL | Status: DC | PRN

## 2011-04-15 NOTE — Assessment & Plan Note (Signed)
41 year old female with acute onset of upper abdominal discomfort, tender at the epigastrium and right upper quadrant. She is afebrile.   differential diagnosis includes gallladder, early appendicitis, peptic ulcer disease , others. Plan: Labs, Ultrasound PPIs, bentil She is aware of my concern about early appendicitis, I recommend that ER eval immediately if the pain is severe, she has fever, nausea, vomiting.

## 2011-04-15 NOTE — Progress Notes (Signed)
  Subjective:    Patient ID: Karen Merritt, female    DOB: June 22, 1970, 41 y.o.   MRN: [id number]  HPI Acute visit Acute onset of epigastric pain yesterday at 6 PM, described as stabbing-sharp, no radiation, pain is steady with on and off exacerbation. She has not eaten anything just water and crackers, when she did that, the pain was not worse. Some nausea. Milk of magnesia helped a little. She hurt all night and couldn't sleep.  Past Medical History  Diagnosis Date  . Hypothyroidism   . Chest pain     non-cardiac 2005, 2012.   Past Surgical History  Procedure Date  . Thyroidectomy 12/29/96  . Stress cardiolite 05/23/03  . Total abdominal hysterectomy 2008  . Bone spur 6/11    right big toe      Review of Systems No fever chills No cough No dysuria or gross hematuria. She had a normal bowel movement yesterday morning, today her bowel movement was watery without blood. Denies any heartburn per se but has noted that he is burping more than usual. Has not taken any Motrin or similar medicines.     Objective:   Physical Exam  Constitutional: She is oriented to person, place, and time. She appears well-developed and well-nourished. No distress.  Eyes:       No pale or jaundice  Cardiovascular: Normal rate, regular rhythm and normal heart sounds.   No murmur heard. Pulmonary/Chest: Effort normal and breath sounds normal. No respiratory distress. She has no wheezes. She has no rales.  Abdominal:         Is not distended, soft;  she is tender to palpation (see graphic), no tenderness to deep palpation at the right lower quadrant. Bowel sounds within normal  Musculoskeletal: She exhibits no edema.  Neurological: She is alert and oriented to person, place, and time.  Skin: She is not diaphoretic.  Psychiatric: She has a normal mood and affect. Her behavior is normal. Judgment and thought content normal.      Assessment & Plan:

## 2011-04-15 NOTE — Patient Instructions (Signed)
Drink plenty of fluids, bland diet. Over-the-counter Prilosec 20 mg one before breakfast every day until  you feel better. If the pain persists, try bentyl. ER if ----> fever, severe pain, nausea and vomiting or if the pain moves to the right lower abdomen where the appendix is   We'll schedule a  ultrasound

## 2011-04-16 ENCOUNTER — Encounter: Payer: Self-pay | Admitting: Internal Medicine

## 2011-04-16 ENCOUNTER — Telehealth: Payer: Self-pay | Admitting: Family Medicine

## 2011-04-16 LAB — CBC WITH DIFFERENTIAL/PLATELET
Basophils Absolute: 0 10*3/uL (ref 0.0–0.1)
Eosinophils Absolute: 0 10*3/uL (ref 0.0–0.7)
Hemoglobin: 13.6 g/dL (ref 12.0–15.0)
Lymphocytes Relative: 20.8 % (ref 12.0–46.0)
MCHC: 33.5 g/dL (ref 30.0–36.0)
Monocytes Relative: 8.3 % (ref 3.0–12.0)
Neutro Abs: 5.6 10*3/uL (ref 1.4–7.7)
Neutrophils Relative %: 70.1 % (ref 43.0–77.0)
RDW: 14 % (ref 11.5–14.6)

## 2011-04-16 LAB — COMPREHENSIVE METABOLIC PANEL
ALT: 18 U/L (ref 0–35)
AST: 22 U/L (ref 0–37)
Albumin: 4 g/dL (ref 3.5–5.2)
BUN: 11 mg/dL (ref 6–23)
CO2: 25 mEq/L (ref 19–32)
Calcium: 8.6 mg/dL (ref 8.4–10.5)
Chloride: 101 mEq/L (ref 96–112)
Creatinine, Ser: 0.7 mg/dL (ref 0.4–1.2)
GFR: 91.08 mL/min (ref >60.00)
Potassium: 3.7 mEq/L (ref 3.5–5.1)

## 2011-04-16 NOTE — Telephone Encounter (Signed)
Pt is expecting a call from our office about her imaging results and requests to be called at work. [phone].

## 2011-04-16 NOTE — Telephone Encounter (Signed)
Discussed with pt

## 2011-04-16 NOTE — Telephone Encounter (Signed)
U/S was ok, labs pending (I just checked)

## 2011-04-16 NOTE — Telephone Encounter (Signed)
It says in the OV note for radiology to contact the pt but they never have. Do you have the x-ray results & I can call the pt back.

## 2011-07-02 ENCOUNTER — Other Ambulatory Visit: Payer: Self-pay | Admitting: Family Medicine

## 2011-07-03 MED ORDER — ZOLPIDEM TARTRATE 10 MG PO TABS: 10.0000 mg | ORAL_TABLET | Freq: Every evening | ORAL | Status: DC | PRN

## 2011-07-03 NOTE — Telephone Encounter (Signed)
Last OV 02-21-11 last refill 04-15-11 #30 with 1 refill

## 2011-07-04 ENCOUNTER — Other Ambulatory Visit: Payer: Self-pay | Admitting: *Deleted

## 2011-07-04 MED ORDER — DIBUCAINE 1 % EX OINT: TOPICAL_OINTMENT | Freq: Every day | CUTANEOUS | Status: DC | PRN

## 2011-07-04 NOTE — Telephone Encounter (Signed)
Last filled 01-22-11 #30g 1, last OV 04-15-11

## 2011-07-04 NOTE — Telephone Encounter (Signed)
Ok w/ 3 refills 

## 2011-07-04 NOTE — Telephone Encounter (Signed)
Refill sent to pharmacy.   

## 2011-07-15 ENCOUNTER — Other Ambulatory Visit: Payer: Self-pay | Admitting: *Deleted

## 2011-07-15 NOTE — Telephone Encounter (Signed)
Pt returned my call, then advised it may be about a recent rejection of one of her medications, noted clarification about the usage of her Ambien, pt states that she takes the Ambein every other night, best contact number for the remainder of the afternoon noted as work number [phone]

## 2011-07-15 NOTE — Telephone Encounter (Signed)
PA form completed and faxed back awaiting response.

## 2011-07-17 NOTE — Telephone Encounter (Signed)
Coverage for drug quantities that exceed 60 nights of treatment in each 90 day period (for prescriptions filled in either a retail pharmacy or mail service pharmacy) is provided in situations in which the prescriber indicates that the patient requires nightly treatment. Coverage will continue at a quantity not exceed 60 nights of drug therapy in each 90 day period.

## 2011-07-18 NOTE — Telephone Encounter (Signed)
Discuss with patient  

## 2011-07-18 NOTE — Telephone Encounter (Signed)
Left message to call office

## 2011-07-18 NOTE — Telephone Encounter (Signed)
If pt is taking med every other night, 60 pills is more than enough for a 90 day period (she should only need 45).  Can continue w/ current script/amount

## 2011-07-31 ENCOUNTER — Encounter: Payer: Self-pay | Admitting: Family Medicine

## 2011-07-31 ENCOUNTER — Ambulatory Visit (INDEPENDENT_AMBULATORY_CARE_PROVIDER_SITE_OTHER): Payer: 59 | Admitting: Family Medicine

## 2011-07-31 VITALS — BP 125/82 | HR 87 | Temp 98.4°F | Ht 65.75 in | Wt 189.0 lb

## 2011-07-31 DIAGNOSIS — M5380 Other specified dorsopathies, site unspecified: Secondary | ICD-10-CM

## 2011-07-31 DIAGNOSIS — M62830 Muscle spasm of back: Secondary | ICD-10-CM | POA: Insufficient documentation

## 2011-07-31 MED ORDER — NAPROXEN 500 MG PO TABS: 500.0000 mg | ORAL_TABLET | Freq: Two times a day (BID) | ORAL | Status: DC

## 2011-07-31 MED ORDER — CYCLOBENZAPRINE HCL 10 MG PO TABS: 10.0000 mg | ORAL_TABLET | Freq: Three times a day (TID) | ORAL | Status: DC | PRN

## 2011-07-31 NOTE — Patient Instructions (Addendum)
This is a low back spasm/strain Start the muscle relaxer at bedtime and weekends Take the Naproxen twice daily- take w/ food to avoid upset stomach Heating pad for pain relief Hang in there!!!

## 2011-07-31 NOTE — Progress Notes (Signed)
  Subjective:    Patient ID: Karen Merritt, female    DOB: 08/05/1970, 41 y.o.   MRN: [id number]  HPI Back pain- was at softball practice on Tuesday night and tweaked back.  No audible pop.  Did not have trouble Tuesday night, Wednesday was stiff, Thursday 'i'm dying'.  Pain is lumbar.  No radiation of pain.  Bandlike across lower back.  No weakness or numbness of legs.  No incontinence.  Worse w/ movement.  + nausea when having severe pain.  Ibuprofen this AM w/out relief.   Review of Systems For ROS see HPI     Objective:   Physical Exam  Vitals reviewed. Constitutional: She appears well-developed and well-nourished.       Obviously uncomfortable w/ movement  Musculoskeletal:       No bony tenderness of spine + paralumbar spasm Limited back flexion and extension due to pain  Neurological: She has normal reflexes. Coordination normal.       (-) SLR bilaterally          Assessment & Plan:

## 2011-08-05 NOTE — Assessment & Plan Note (Signed)
Pt's pain is muscular.  No red flags on hx or PE.  Start scheduled NSAIDs, muscle relaxer prn.  Reviewed supportive care and red flags that should prompt return.  Pt expressed understanding and is in agreement w/ plan.

## 2011-10-20 ENCOUNTER — Ambulatory Visit (HOSPITAL_BASED_OUTPATIENT_CLINIC_OR_DEPARTMENT_OTHER)
Admission: RE | Admit: 2011-10-20 | Discharge: 2011-10-20 | Disposition: A | Discharge status: Home / Self Care | Payer: 59 | Source: Ambulatory Visit | Attending: Family Medicine | Admitting: Family Medicine

## 2011-10-20 ENCOUNTER — Ambulatory Visit (INDEPENDENT_AMBULATORY_CARE_PROVIDER_SITE_OTHER): Payer: 59 | Admitting: Family Medicine

## 2011-10-20 ENCOUNTER — Encounter: Payer: Self-pay | Admitting: Family Medicine

## 2011-10-20 VITALS — BP 115/71 | HR 86 | Temp 98.4°F | Ht 65.5 in | Wt 191.8 lb

## 2011-10-20 DIAGNOSIS — M79643 Pain in unspecified hand: Secondary | ICD-10-CM

## 2011-10-20 DIAGNOSIS — M79609 Pain in unspecified limb: Secondary | ICD-10-CM

## 2011-10-20 MED ORDER — IBUPROFEN 800 MG PO TABS: 800.0000 mg | ORAL_TABLET | Freq: Three times a day (TID) | ORAL | Status: DC | PRN

## 2011-10-20 NOTE — Assessment & Plan Note (Signed)
New.  Differential includes superficial thrombophlebitis, painful ganglion, bony overgrowth, bone cyst, or other.  Check xray to r/o bony abnormality.  Start prescription strength NSAIDs, continue to ice.  If no improvement or worsening, will refer to hand specialist.  Reviewed supportive care and red flags that should prompt return.  Pt expressed understanding and is in agreement w/ plan.

## 2011-10-20 NOTE — Progress Notes (Signed)
  Subjective:    Patient ID: Karen Merritt, female    DOB: [birth year], 41 y.o.   MRN: [id number]  HPI L hand bump- on dorsum of L hand, 1st noticed 1 week ago.  Last night had severe pain, very swollen, warm to touch.  No known injury or bite.  No fevers.  Pain and swelling improved w/ ibuprofen 400mg  and ice.   Review of Systems For ROS see HPI     Objective:   Physical Exam  Vitals reviewed. Constitutional: She appears well-developed and well-nourished. No distress.  Musculoskeletal:       Left hand: She exhibits tenderness, bony tenderness, deformity and swelling. She exhibits normal range of motion and no laceration. normal sensation noted. Normal strength noted.       Hands:         Assessment & Plan:

## 2011-10-20 NOTE — Patient Instructions (Addendum)
[facility] to [facility] and get your [facility] Start the Ibuprofen 3x/day for 5 days and then as needed ICE! If no improvement by Wednesday, call and we'll send you to a hand specialist Hang in there!!!

## 2011-10-22 ENCOUNTER — Telehealth: Payer: Self-pay | Admitting: Family Medicine

## 2011-10-22 MED ORDER — CEPHALEXIN 500 MG PO CAPS: 500.0000 mg | ORAL_CAPSULE | Freq: Two times a day (BID) | ORAL | Status: DC

## 2011-10-22 NOTE — Telephone Encounter (Signed)
Have her start Keflex 500mg  BID x7 days for possible cellulitis due to redness/swelling

## 2011-10-22 NOTE — Telephone Encounter (Signed)
Spoke to pt to advise results/instructions. Pt understood. New Rx sent to pharmacy

## 2011-10-22 NOTE — Telephone Encounter (Signed)
pt wanted to update dr. On care of status  No pain / Pain level at 0 / no tenderness Is still red and warm to the touch  Can cb [phone]

## 2011-10-22 NOTE — Telephone Encounter (Signed)
Please advise 

## 2011-12-15 ENCOUNTER — Other Ambulatory Visit: Payer: Self-pay

## 2011-12-15 MED ORDER — LEVOTHYROXINE SODIUM 100 MCG PO TABS: 100.0000 ug | ORAL_TABLET | Freq: Every day | ORAL | Status: DC

## 2011-12-22 ENCOUNTER — Other Ambulatory Visit: Payer: Self-pay

## 2011-12-22 NOTE — Telephone Encounter (Signed)
Pt LMOVM stating haven't had a physical scheduled in January and out of med requesting refill on synthroid. I called pharmacy because I am showing it was filled 12/15/11. Pharmacist advised me they received the Rx and don't know why another Rx request was sent to [place] and will advise pt when they finish filling Rx.      MW

## 2011-12-24 ENCOUNTER — Encounter: Payer: Self-pay | Admitting: Family Medicine

## 2011-12-26 ENCOUNTER — Telehealth: Payer: Self-pay | Admitting: *Deleted

## 2011-12-26 DIAGNOSIS — K594 Anal spasm: Secondary | ICD-10-CM

## 2011-12-26 NOTE — Telephone Encounter (Signed)
LM at (4:37pm) asking the pt to RTC.//AB/CMA

## 2011-12-26 NOTE — Telephone Encounter (Signed)
Recommend a GI referral for anal spasm. If symptoms severe, constipation, blood in the stools-- needs to go to a urgent care during the weekend

## 2011-12-26 NOTE — Telephone Encounter (Signed)
Pt called to say that she's still having a lot of rectal pain and pressure.  Pt stated that she would like to have an referral to an surgeon.  She states if feels like muscle spasms, her BP drops, and her lips feel cold.  The pain comes mainly at night and it wakes her up.  This has been going on for a few months.  Pt states she's still using the ointment,watching her diet for fiber, and she trying not to sit for along period of time.   Pt said she don't know what to do.//AB/CMA

## 2011-12-29 NOTE — Telephone Encounter (Signed)
Informed pt that Dr. Drue Novel addressed her call regarding her rectal pain, and he recommends a GI referral.  Pt agreed to the referral.  Referral ordered.//AB/CMA

## 2011-12-30 ENCOUNTER — Telehealth: Payer: Self-pay | Admitting: Family Medicine

## 2011-12-30 NOTE — Telephone Encounter (Signed)
Pt called and wanted to see if Dr. Loreta Ave (who is in her network) be where she goes vs. Dr.Patterson. Pls call pt when you get a chance.

## 2011-12-31 ENCOUNTER — Encounter: Payer: Self-pay | Admitting: *Deleted

## 2012-01-02 ENCOUNTER — Ambulatory Visit (INDEPENDENT_AMBULATORY_CARE_PROVIDER_SITE_OTHER): Payer: 59 | Admitting: Gastroenterology

## 2012-01-02 ENCOUNTER — Encounter: Payer: Self-pay | Admitting: Gastroenterology

## 2012-01-02 VITALS — BP 106/74 | HR 76 | Ht 65.5 in | Wt 191.0 lb

## 2012-01-02 DIAGNOSIS — K594 Anal spasm: Secondary | ICD-10-CM

## 2012-01-02 MED ORDER — HYDROCORTISONE ACETATE 25 MG RE SUPP: 25.0000 mg | Freq: Every evening | RECTAL | Status: DC | PRN

## 2012-01-02 MED ORDER — NITROGLYCERIN 0.4 % RE OINT: 1.0000 "application " | TOPICAL_OINTMENT | RECTAL | Status: DC | PRN

## 2012-01-02 MED ORDER — HYOSCYAMINE SULFATE 0.125 MG SL SUBL: 0.1250 mg | SUBLINGUAL_TABLET | SUBLINGUAL | Status: DC | PRN

## 2012-01-02 NOTE — Progress Notes (Signed)
This is a very nice 41 year old patient who has recurrent pain in her rectum which usually awakens her from sleep at night and will last several hours in duration.  She previously has had recurrent rectal fissures all but denies new rectal tearing, pain, or rectal bleeding with bowel movements at this time.  Previous screening colonoscopy in November of 2011 was unremarkable save some external hemorrhoids and perianal papillae.  Patient currently is having normal bowel movements and denies abdominal pain, upper GI or hepatobiliary symptoms otherwise.  These attacks occur approximately once every 3 months, and previous use of Nupercainal has been of little success.  She follows a regular diet and denies a specific food intolerances.  She also sees urology for symptomatic bladder spasms.  Current Medications, Allergies, Past Medical History, Past Surgical History, Family History and Social History were reviewed in [employer] record.  Pertinent Review of Systems negative.   Physical Exam: Blood pressure 106/74, pulse 76 and regular, and weight 191 the BMI of 31.3.  Cannot appreciate stigmata of chronic liver disease.  Her abdomen shows no organomegaly, masses, tenderness, or distention.  Bowel sounds are normal. L. Status is normal.   ANOSCOPY: Visualization of her anal area shows skin tags anterior and posteriorly.  Rectal exam shows no masses or tenderness or evidence of palpable mass.  Stool is soft and guaiac-negative.  Anoscopy shows small nonbleeding internal hemorrhoid columns but otherwise is unremarkable without a definite fissure or proctitis.    Assessment and Plan: Proctalgia fugax of unexplained etiology.  I do not think she has a current rectal fissure, and there has been no evidence in the past of inflammatory bowel disease,ie Crohn's disease.  Her episodes occur infrequently, and when these do occur ,I will let her apply local nitroglycerin gel to her rectal area  after she has inserted an Anusol-HC suppository into her rectum.  Also prescribe when necessary sublingual Levsin as tolerated.  If her symptoms do not respond to above therapy or continue to be more frequent or more severe,, we were for her to Dr. Dorita Fray at [facility] for anorectal manometry and consideration of possible sphincterotomy or Botox injections to her internal rectal sphincter. No diagnosis found.

## 2012-01-02 NOTE — Patient Instructions (Signed)
We have sent the following medications to your pharmacy for you to pick up at your convenience: Anusol suppositories, Levsin and Rectiv. Please take as directed.

## 2012-01-05 ENCOUNTER — Telehealth: Payer: Self-pay | Admitting: *Deleted

## 2012-01-05 NOTE — Telephone Encounter (Signed)
Fax from [facility] they do not carry Rectiv.  [facility] [phone] and they can order it and have for patient to pick up tomorrow.  Gave prescription info for Rectiv to pharmacist Selena Batten.  Called patient on cell and home number number listed and had to leave a message.   Waiting on call back.

## 2012-01-06 ENCOUNTER — Encounter: Payer: Self-pay | Admitting: Lab

## 2012-01-07 ENCOUNTER — Ambulatory Visit (INDEPENDENT_AMBULATORY_CARE_PROVIDER_SITE_OTHER): Payer: 59 | Admitting: Family Medicine

## 2012-01-07 ENCOUNTER — Encounter: Payer: Self-pay | Admitting: Family Medicine

## 2012-01-07 VITALS — BP 108/70 | HR 72 | Temp 98.1°F | Ht 65.5 in | Wt 191.8 lb

## 2012-01-07 DIAGNOSIS — H9209 Otalgia, unspecified ear: Secondary | ICD-10-CM | POA: Insufficient documentation

## 2012-01-07 NOTE — Progress Notes (Signed)
  Subjective:    Patient ID: Karen Merritt, female    DOB: 11-01-1970, 41 y.o.   MRN: [id number]  HPI Ear pain- 'monday i was dying'.  Severe L ear pain, L side of face painful.  Improved w/ ibuprofen q4.  No fevers.  Pt reports pain is better today.  + nasal congestion.  No cough.  No known sick contacts.  Pt admits to increased stress recently.   Review of Systems For ROS see HPI     Objective:   Physical Exam  Vitals reviewed. Constitutional: She appears well-developed and well-nourished. No distress.  HENT:  Head: Normocephalic and atraumatic.  Right Ear: Tympanic membrane normal.  Left Ear: Tympanic membrane normal.  Nose: Mucosal edema and rhinorrhea present. Right sinus exhibits no maxillary sinus tenderness and no frontal sinus tenderness. Left sinus exhibits no maxillary sinus tenderness and no frontal sinus tenderness.  Mouth/Throat: Mucous membranes are normal. Posterior oropharyngeal erythema (w/ PND) present.       No ear abnormalities L face nontender to palpation  Eyes: Conjunctivae normal and EOM are normal. Pupils are equal, round, and reactive to light.  Neck: Normal range of motion. Neck supple.  Cardiovascular: Normal rate, regular rhythm and normal heart sounds.   Pulmonary/Chest: Effort normal and breath sounds normal. No respiratory distress. She has no wheezes. She has no rales.  Lymphadenopathy:    She has no cervical adenopathy.          Assessment & Plan:

## 2012-01-07 NOTE — Patient Instructions (Addendum)
There's no current infection Continue the ibuprofen as needed My guess is that your ear pain is due to stress, teeth clenching, jaw pain Call if your symptoms change or worsen Hang in there! Happy Holidays!

## 2012-01-07 NOTE — Assessment & Plan Note (Signed)
New.  Spontaneously resolving.  Suspect this was due to pt grinding her teeth.  No evidence of infxn.  Reviewed supportive care and red flags that should prompt return.  Pt expressed understanding and is in agreement w/ plan.

## 2012-01-19 ENCOUNTER — Telehealth: Payer: Self-pay | Admitting: Gastroenterology

## 2012-01-20 NOTE — Telephone Encounter (Signed)
Prn protofoam

## 2012-01-20 NOTE — Telephone Encounter (Signed)
Karen Merritt is to expensive, is there something else patient can take?

## 2012-02-05 ENCOUNTER — Encounter: Payer: Self-pay | Admitting: Lab

## 2012-02-06 ENCOUNTER — Encounter: Payer: Self-pay | Admitting: Family Medicine

## 2012-02-06 ENCOUNTER — Ambulatory Visit (INDEPENDENT_AMBULATORY_CARE_PROVIDER_SITE_OTHER): Payer: 59 | Admitting: Family Medicine

## 2012-02-06 VITALS — BP 100/70 | HR 64 | Temp 98.1°F | Ht 65.75 in | Wt 190.6 lb

## 2012-02-06 DIAGNOSIS — Z0000 Encounter for general adult medical examination without abnormal findings: Secondary | ICD-10-CM

## 2012-02-06 LAB — HEPATIC FUNCTION PANEL
Albumin: 3.8 g/dL (ref 3.5–5.2)
Bilirubin, Direct: 0 mg/dL (ref 0.0–0.3)
Total Protein: 7 g/dL (ref 6.0–8.3)

## 2012-02-06 LAB — CBC WITH DIFFERENTIAL/PLATELET
Basophils Absolute: 0.1 10*3/uL (ref 0.0–0.1)
HCT: 38 % (ref 36.0–46.0)
Hemoglobin: 12.6 g/dL (ref 12.0–15.0)
Lymphs Abs: 2.2 10*3/uL (ref 0.7–4.0)
MCHC: 33.3 g/dL (ref 30.0–36.0)
Monocytes Relative: 7.4 % (ref 3.0–12.0)
Neutro Abs: 3.3 10*3/uL (ref 1.4–7.7)
RDW: 13.4 % (ref 11.5–14.6)

## 2012-02-06 LAB — BASIC METABOLIC PANEL
CO2: 28 mEq/L (ref 19–32)
Calcium: 9.2 mg/dL (ref 8.4–10.5)
Creatinine, Ser: 0.9 mg/dL (ref 0.4–1.2)
Glucose, Bld: 84 mg/dL (ref 70–99)

## 2012-02-06 LAB — LIPID PANEL
Cholesterol: 186 mg/dL (ref 0–200)
Triglycerides: 121 mg/dL (ref 0.0–149.0)

## 2012-02-06 LAB — TSH: TSH: 2.93 u[IU]/mL (ref 0.35–5.50)

## 2012-02-06 MED ORDER — ZOLPIDEM TARTRATE 10 MG PO TABS: 10.0000 mg | ORAL_TABLET | Freq: Every evening | ORAL | Status: DC | PRN

## 2012-02-06 NOTE — Patient Instructions (Addendum)
Follow up in 1 year or as needed We'll notify you of your lab results and make any changes if needed Keep up the good work!  You look great! Call with any questions or concerns Happy New Year!!! 

## 2012-02-06 NOTE — Progress Notes (Signed)
  Subjective:    Patient ID: Karen Merritt, female    DOB: 11/26/1970, 42 y.o.   MRN: [id number]  HPI CPE- no concerns, UTD on GYN Arlyce Dice), Colonoscopy- Jarold Motto.   Review of Systems Patient reports no vision/ hearing changes, adenopathy,fever, weight change,  persistant/recurrent hoarseness , swallowing issues, chest pain, palpitations, edema, persistant/recurrent cough, hemoptysis, dyspnea (rest/exertional/paroxysmal nocturnal), gastrointestinal bleeding (melena, rectal bleeding), abdominal pain, significant heartburn, bowel changes, GU symptoms (dysuria, hematuria, incontinence), Gyn symptoms (abnormal  bleeding, pain),  syncope, focal weakness, memory loss, numbness & tingling, skin/hair/nail changes, abnormal bruising or bleeding, anxiety, or depression.     Objective:   Physical Exam General Appearance:    Alert, cooperative, no distress, appears stated age  Head:    Normocephalic, without obvious abnormality, atraumatic  Eyes:    PERRL, conjunctiva/corneas clear, EOM's intact, fundi    benign, both eyes  Ears:    Normal TM's and external ear canals, both ears  Nose:   Nares normal, septum midline, mucosa normal, no drainage    or sinus tenderness  Throat:   Lips, mucosa, and tongue normal; teeth and gums normal  Neck:   Supple, symmetrical, trachea midline, no adenopathy;    Thyroid: no enlargement/tenderness/nodules  Back:     Symmetric, no curvature, ROM normal, no CVA tenderness  Lungs:     Clear to auscultation bilaterally, respirations unlabored  Chest Wall:    No tenderness or deformity   Heart:    Regular rate and rhythm, S1 and S2 normal, no murmur, rub   or gallop  Breast Exam:    Deferred to GYN  Abdomen:     Soft, non-tender, bowel sounds active all four quadrants,    no masses, no organomegaly  Genitalia:    Deferred to GYN  Rectal:    Extremities:   Extremities normal, atraumatic, no cyanosis or edema  Pulses:   2+ and symmetric all extremities  Skin:   Skin  color, texture, turgor normal, no rashes or lesions  Lymph nodes:   Cervical, supraclavicular, and axillary nodes normal  Neurologic:   CNII-XII intact, normal strength, sensation and reflexes    throughout          Assessment & Plan:

## 2012-02-06 NOTE — Assessment & Plan Note (Signed)
Pt's PE WNL.  UTD on health maintenance.  EKG done- see document for interpretation.  Check labs.  Anticipatory guidance provided.  

## 2012-02-09 ENCOUNTER — Telehealth: Payer: Self-pay | Admitting: Family Medicine

## 2012-02-11 LAB — VITAMIN D 1,25 DIHYDROXY: Vitamin D2 1, 25 (OH)2: <8 pg/mL

## 2012-02-12 ENCOUNTER — Other Ambulatory Visit: Payer: Self-pay | Admitting: Family Medicine

## 2012-02-12 MED ORDER — LEVOTHYROXINE SODIUM 100 MCG PO TABS: 100.0000 ug | ORAL_TABLET | Freq: Every day | ORAL | Status: DC

## 2012-03-08 ENCOUNTER — Encounter: Payer: Self-pay | Admitting: Family Medicine

## 2012-04-27 ENCOUNTER — Ambulatory Visit (INDEPENDENT_AMBULATORY_CARE_PROVIDER_SITE_OTHER): Payer: 59 | Admitting: Family Medicine

## 2012-04-27 ENCOUNTER — Encounter: Payer: Self-pay | Admitting: Family Medicine

## 2012-04-27 VITALS — BP 118/68 | HR 80 | Temp 99.0°F | Wt 187.4 lb

## 2012-04-27 DIAGNOSIS — K219 Gastro-esophageal reflux disease without esophagitis: Secondary | ICD-10-CM

## 2012-04-27 DIAGNOSIS — R109 Unspecified abdominal pain: Secondary | ICD-10-CM

## 2012-04-27 DIAGNOSIS — R112 Nausea with vomiting, unspecified: Secondary | ICD-10-CM

## 2012-04-27 LAB — POCT URINALYSIS DIPSTICK
Protein, UA: NEGATIVE
Spec Grav, UA: <=1.005
Urobilinogen, UA: 0.2
pH, UA: 8.5

## 2012-04-27 MED ORDER — PROMETHAZINE HCL 50 MG/ML IJ SOLN
50.0000 mg | Freq: Once | INTRAMUSCULAR | Status: AC
  Administered 2012-04-27: 50 mg via INTRAMUSCULAR

## 2012-04-27 MED ORDER — PROMETHAZINE HCL 25 MG PO TABS: 25.0000 mg | ORAL_TABLET | Freq: Three times a day (TID) | ORAL | Status: DC | PRN

## 2012-04-27 MED ORDER — ESOMEPRAZOLE MAGNESIUM 40 MG PO CPDR: 40.0000 mg | DELAYED_RELEASE_CAPSULE | Freq: Every day | ORAL | Status: DC

## 2012-04-27 MED ORDER — GI COCKTAIL ~~LOC~~
30.0000 mL | Freq: Once | ORAL | Status: AC
  Administered 2012-04-27: 30 mL via ORAL

## 2012-04-27 NOTE — Addendum Note (Signed)
Addended by: Arnette Norris on: 04/27/2012 06:22 PM   Modules accepted: Orders

## 2012-04-27 NOTE — Progress Notes (Signed)
  Subjective:     Karen Merritt is a 42 y.o. female who presents for evaluation of abdominal pain. Onset was 1  days ago. Symptoms have been gradually worsening. The pain is described as cramping, and is 7/10 in intensity. Pain is located in the epigastric region without radiation.  Aggravating factors: eating and spicy foods.  Alleviating factors: antacids and belching. Associated symptoms: belching, nausea and vomiting. The patient denies arthralagias, chills, constipation, diarrhea, dysuria, fever, flatus, frequency, headache, hematochezia, hematuria, melena, myalgias and sweats.  The patient's history has been marked as reviewed and updated as appropriate.  Review of Systems Pertinent items are noted in HPI.     Objective:    BP 118/68  Pulse 80  Temp(Src) 99 F (37.2 C) (Oral)  Wt 187 lb 6.4 oz (85.004 kg)  BMI 30.48 kg/m2  SpO2 98% General appearance: alert, cooperative, appears stated age and moderate distress Nose: Nares normal. Septum midline. Mucosa normal. No drainage or sinus tenderness. Throat: lips, mucosa, and tongue normal; teeth and gums normal Neck: no adenopathy, supple, symmetrical, trachea midline and thyroid not enlarged, symmetric, no tenderness/mass/nodules Back: symmetric, no curvature. ROM normal. No CVA tenderness. Lungs: clear to auscultation bilaterally Heart: S1, S2 normal Abdomen: abnormal findings:  moderate tenderness in the epigastrium    Assessment:    Abdominal pain, likely secondary to gerd/ pud .    Plan:    The diagnosis was discussed with the patient and evaluation and treatment plans outlined. See orders for lab and imaging studies. Adhere to simple, bland diet. Initiate empiric trial of acid suppression; see orders. Further follow-up plans will be based on outcome of lab/imaging studies; see orders. Follow up as needed. refer to GI

## 2012-04-27 NOTE — Patient Instructions (Signed)
Diet for Gastroesophageal Reflux Disease, Adult  Reflux (acid reflux) is when acid from your stomach flows up into the esophagus. When acid comes in contact with the esophagus, the acid causes irritation and soreness (inflammation) in the esophagus. When reflux happens often or so severely that it causes damage to the esophagus, it is called gastroesophageal reflux disease (GERD). Nutrition therapy can help ease the discomfort of GERD.  FOODS OR DRINKS TO AVOID OR LIMIT   Smoking or chewing tobacco. Nicotine is one of the most potent stimulants to acid production in the gastrointestinal tract.   Caffeinated and decaffeinated coffee and black tea.   Regular or low-calorie carbonated beverages or energy drinks (caffeine-free carbonated beverages are allowed).    Strong spices, such as black pepper, white pepper, red pepper, cayenne, curry powder, and chili powder.   Peppermint or spearmint.   Chocolate.   High-fat foods, including meats and fried foods. Extra added fats including oils, butter, salad dressings, and nuts. Limit these to less than 8 tsp per day.   Fruits and vegetables if they are not tolerated, such as citrus fruits or tomatoes.   Alcohol.   Any food that seems to aggravate your condition.  If you have questions regarding your diet, call your caregiver or a registered dietitian.  OTHER THINGS THAT MAY HELP GERD INCLUDE:    Eating your meals slowly, in a relaxed setting.   Eating 5 to 6 small meals per day instead of 3 large meals.   Eliminating food for a period of time if it causes distress.   Not lying down until 3 hours after eating a meal.   Keeping the head of your bed raised 6 to 9 inches (15 to 23 cm) by using a foam wedge or blocks under the legs of the bed. Lying flat may make symptoms worse.   Being physically active. Weight loss may be helpful in reducing reflux in overweight or obese adults.   Wear loose fitting clothing  EXAMPLE MEAL PLAN  This meal plan is approximately  2,000 calories based on [employer]My[employer].gov meal planning guidelines.  Breakfast    cup cooked oatmeal.   1 cup strawberries.   1 cup low-fat milk.   1 oz almonds.  Snack   1 cup cucumber slices.   6 oz yogurt (made from low-fat or fat-free milk).  Lunch   2 slice whole-wheat bread.   2 oz sliced turkey.   2 tsp mayonnaise.   1 cup blueberries.   1 cup snap peas.  Snack   6 whole-wheat crackers.   1 oz string cheese.  Dinner    cup brown rice.   1 cup mixed veggies.   1 tsp olive oil.   3 oz grilled fish.  Document Released: 01/06/2005 Document Revised: 03/31/2011 Document Reviewed: 11/22/2010  [employer] Patient Information 2013 [employer].

## 2012-04-28 LAB — BASIC METABOLIC PANEL
CO2: 27 mEq/L (ref 19–32)
Chloride: 104 mEq/L (ref 96–112)
Potassium: 4 mEq/L (ref 3.5–5.1)
Sodium: 136 mEq/L (ref 135–145)

## 2012-04-28 LAB — HEPATIC FUNCTION PANEL
ALT: 21 U/L (ref 0–35)
AST: 21 U/L (ref 0–37)
Bilirubin, Direct: 0.1 mg/dL (ref 0.0–0.3)
Total Protein: 7.1 g/dL (ref 6.0–8.3)

## 2012-04-28 LAB — CBC WITH DIFFERENTIAL/PLATELET
Basophils Relative: 0.1 % (ref 0.0–3.0)
Eosinophils Absolute: 0 10*3/uL (ref 0.0–0.7)
Eosinophils Relative: 0.3 % (ref 0.0–5.0)
Hemoglobin: 13.8 g/dL (ref 12.0–15.0)
MCHC: 32.5 g/dL (ref 30.0–36.0)
MCV: 83.7 fl (ref 78.0–100.0)
Monocytes Absolute: 0.3 10*3/uL (ref 0.1–1.0)
Neutro Abs: 8.1 10*3/uL — ABNORMAL HIGH (ref 1.4–7.7)
RBC: 5.1 Mil/uL (ref 3.87–5.11)

## 2012-05-06 ENCOUNTER — Other Ambulatory Visit (INDEPENDENT_AMBULATORY_CARE_PROVIDER_SITE_OTHER): Payer: 59

## 2012-05-06 ENCOUNTER — Telehealth: Payer: Self-pay | Admitting: *Deleted

## 2012-05-06 DIAGNOSIS — D7289 Other specified disorders of white blood cells: Secondary | ICD-10-CM

## 2012-05-06 LAB — CBC WITH DIFFERENTIAL/PLATELET
Basophils Absolute: 0 10*3/uL (ref 0.0–0.1)
Eosinophils Absolute: 0.1 10*3/uL (ref 0.0–0.7)
Eosinophils Relative: 1 % (ref 0.0–5.0)
HCT: 42.6 % (ref 36.0–46.0)
Lymphs Abs: 2.7 10*3/uL (ref 0.7–4.0)
MCHC: 33.6 g/dL (ref 30.0–36.0)
MCV: 81.7 fl (ref 78.0–100.0)
Monocytes Absolute: 0.8 10*3/uL (ref 0.1–1.0)
Neutrophils Relative %: 56 % (ref 43.0–77.0)
Platelets: 245 10*3/uL (ref 150.0–400.0)
RDW: 14.5 % (ref 11.5–14.6)
WBC: 8.1 10*3/uL (ref 4.5–10.5)

## 2012-05-06 NOTE — Progress Notes (Signed)
LABS ONLY  

## 2012-05-06 NOTE — Telephone Encounter (Signed)
Spoke with the pt and informed her of recent lab results and note.  Pt understood and agreed.  Pt scheduled lab appt and lab ordered and sent.//AB/CMA

## 2012-05-06 NOTE — Telephone Encounter (Signed)
Message copied by Verdie Shire on Thu May 06, 2012  9:18 AM ------      Message from: Lelon Perla      Created: Sun May 02, 2012  1:32 PM       Recheck cbcd this week ------

## 2012-05-11 ENCOUNTER — Ambulatory Visit: Payer: 59 | Admitting: Gastroenterology

## 2012-05-20 ENCOUNTER — Ambulatory Visit (INDEPENDENT_AMBULATORY_CARE_PROVIDER_SITE_OTHER): Payer: 59 | Admitting: Gastroenterology

## 2012-05-20 ENCOUNTER — Encounter: Payer: Self-pay | Admitting: Gastroenterology

## 2012-05-20 VITALS — BP 96/70 | HR 72 | Ht 65.5 in | Wt 188.2 lb

## 2012-05-20 DIAGNOSIS — R1013 Epigastric pain: Secondary | ICD-10-CM

## 2012-05-20 DIAGNOSIS — R110 Nausea: Secondary | ICD-10-CM

## 2012-05-20 DIAGNOSIS — K219 Gastro-esophageal reflux disease without esophagitis: Secondary | ICD-10-CM

## 2012-05-20 NOTE — Patient Instructions (Addendum)
You have been scheduled for an endoscopy with propofol. Please follow written instructions given to you at your visit today. If you use inhalers (even only as needed), please bring them with you on the day of your procedure. Your physician has requested that you go to www.startemmi.com and enter the access code given to you at your visit today. This web site gives a general overview about your procedure. However, you should still follow specific instructions given to you by our office regarding your preparation for the procedure.  Information on acid reflux is below. _________________________________________________________  Diet for Gastroesophageal Reflux Disease, Adult Reflux (acid reflux) is when acid from your stomach flows up into the esophagus. When acid comes in contact with the esophagus, the acid causes irritation and soreness (inflammation) in the esophagus. When reflux happens often or so severely that it causes damage to the esophagus, it is called gastroesophageal reflux disease (GERD). Nutrition therapy can help ease the discomfort of GERD. FOODS OR DRINKS TO AVOID OR LIMIT  Smoking or chewing tobacco. Nicotine is one of the most potent stimulants to acid production in the gastrointestinal tract.  Caffeinated and decaffeinated coffee and black tea.  Regular or low-calorie carbonated beverages or energy drinks (caffeine-free carbonated beverages are allowed).   Strong spices, such as black pepper, white pepper, red pepper, cayenne, curry powder, and chili powder.  Peppermint or spearmint.  Chocolate.  High-fat foods, including meats and fried foods. Extra added fats including oils, butter, salad dressings, and nuts. Limit these to less than 8 tsp per day.  Fruits and vegetables if they are not tolerated, such as citrus fruits or tomatoes.  Alcohol.  Any food that seems to aggravate your condition. If you have questions regarding your diet, call your caregiver or a  registered dietitian. OTHER THINGS THAT MAY HELP GERD INCLUDE:   Eating your meals slowly, in a relaxed setting.  Eating 5 to 6 small meals per day instead of 3 large meals.  Eliminating food for a period of time if it causes distress.  Not lying down until 3 hours after eating a meal.  Keeping the head of your bed raised 6 to 9 inches (15 to 23 cm) by using a foam wedge or blocks under the legs of the bed. Lying flat may make symptoms worse.  Being physically active. Weight loss may be helpful in reducing reflux in overweight or obese adults.  Wear loose fitting clothing EXAMPLE MEAL PLAN This meal plan is approximately 2,000 calories based on https://www.bernard.org/ meal planning guidelines. Breakfast   cup cooked oatmeal.  1 cup strawberries.  1 cup low-fat milk.  1 oz almonds. Snack  1 cup cucumber slices.  6 oz yogurt (made from low-fat or fat-free milk). Lunch  2 slice whole-wheat bread.  2 oz sliced [place].  2 tsp mayonnaise.  1 cup blueberries.  1 cup snap peas. Snack  6 whole-wheat crackers.  1 oz string cheese. Dinner   cup brown rice.  1 cup mixed veggies.  1 tsp olive oil.  3 oz grilled fish. Document Released: 01/06/2005 Document Revised: 03/31/2011 Document Reviewed: 11/22/2010 [facility] Patient Information 2013 [place], [place].

## 2012-05-20 NOTE — Progress Notes (Signed)
This is a 42 year old African American female who has had 3 weeks of rather severe burning substernal chest pain with some gagging.  She was seen acutely by Dr. Laury Axon , was given GI cocktail and I am Phenergan and has had good resolution of her substernal pain, but has continued on Nexium 40 mg a day although she admits that she takes is very irregularly.  She has in retrospect has some acid reflux problems in the past, but has not had endoscopy or upper GI series.  There is no history of Raynaud's phenomenon or collagen vascular disease.  She has had previous negative colonoscopy.  Also previous upper abdominal ultrasound showed no evidence of cholelithiasis, and she denies any hepatobiliary, cardiovascular pulmonary complaints.  Her appetite is good her weight is stable.  She denies abuse of alcohol, cigarettes, or NSAIDs.   Current Medications, Allergies, Past Medical History, Past Surgical History, Family History and Social History were reviewed in [employer] record.  ROS: All systems were reviewed and are negative unless otherwise stated in the HPI.          Physical Exam: Healthy patient in no distress.  Blood pressure 96/70, pulse 72 and regular, and weight 188 with a BMI of 30.84.  98% oxygen saturation room air.  I cannot appreciate stigmata of chronic liver disease.  Her chest is clear, and there no murmurs gallops or rubs.  Abdominal exam shows no organomegaly, masses or tenderness.  Bowel sounds are normal.  Mental status is normal.    Assessment and Plan: Atypical acid reflux symptoms and a patient who has a history of chronic nonspecific GI issues.  I've urged her to take her Nexium 40 mg every morning, and have scheduled endoscopy for diagnostic purposes.  Also give her information concerning acid reflux and is management.  If her endoscopy is entirely unremarkable, we will consider esophageal manometry, and repeat upper abdominal ultrasound exam.last done in  March of 2013.  She denies problems with rectal and anal pain at this time and has a previous diagnosis of proctalgia fugax. Encounter Diagnoses  Name Primary?  . GERD (gastroesophageal reflux disease) Yes  . Abdominal pain, epigastric   . Nausea alone

## 2012-05-24 ENCOUNTER — Encounter: Payer: Self-pay | Admitting: *Deleted

## 2012-05-26 ENCOUNTER — Encounter: Payer: Self-pay | Admitting: Gastroenterology

## 2012-05-26 ENCOUNTER — Ambulatory Visit (AMBULATORY_SURGERY_CENTER): Payer: 59 | Admitting: Gastroenterology

## 2012-05-26 VITALS — BP 114/70 | HR 72 | Temp 97.8°F | Resp 24 | Ht 65.0 in | Wt 188.0 lb

## 2012-05-26 DIAGNOSIS — K219 Gastro-esophageal reflux disease without esophagitis: Secondary | ICD-10-CM

## 2012-05-26 DIAGNOSIS — R079 Chest pain, unspecified: Secondary | ICD-10-CM

## 2012-05-26 MED ORDER — SODIUM CHLORIDE 0.9 % IV SOLN: 500.0000 mL | INTRAVENOUS | Status: DC

## 2012-05-26 NOTE — Patient Instructions (Addendum)
Discharge instructions given with verbal understanding. Biopsy taken. Resume previous medications. YOU HAD AN ENDOSCOPIC PROCEDURE TODAY AT THE [facility]: Refer to the procedure report that was given to you for any specific questions about what was found during the examination.  If the procedure report does not answer your questions, please call your gastroenterologist to clarify.  If you requested that your care partner not be given the details of your procedure findings, then the procedure report has been included in a sealed envelope for you to review at your convenience later.  YOU SHOULD EXPECT: Some feelings of bloating in the abdomen. Passage of more gas than usual.  Walking can help get rid of the air that was put into your GI tract during the procedure and reduce the bloating. If you had a lower endoscopy (such as a colonoscopy or flexible sigmoidoscopy) you may notice spotting of blood in your stool or on the toilet paper. If you underwent a bowel prep for your procedure, then you may not have a normal bowel movement for a few days.  DIET: Your first meal following the procedure should be a light meal and then it is ok to progress to your normal diet.  A half-sandwich or bowl of soup is an example of a good first meal.  Heavy or fried foods are harder to digest and may make you feel nauseous or bloated.  Likewise meals heavy in dairy and vegetables can cause extra gas to form and this can also increase the bloating.  Drink plenty of fluids but you should avoid alcoholic beverages for 24 hours.  ACTIVITY: Your care partner should take you home directly after the procedure.  You should plan to take it easy, moving slowly for the rest of the day.  You can resume normal activity the day after the procedure however you should NOT DRIVE or use heavy machinery for 24 hours (because of the sedation medicines used during the test).    SYMPTOMS TO REPORT IMMEDIATELY: A gastroenterologist  can be reached at any hour.  During normal business hours, 8:30 AM to 5:00 PM Monday through Friday, call [phone].  After hours and on weekends, please call the GI answering service at [phone] who will take a message and have the physician on call contact you.   Following upper endoscopy (EGD)  Vomiting of blood or coffee ground material  New chest pain or pain under the shoulder blades  Painful or persistently difficult swallowing  New shortness of breath  Fever of 100F or higher  Black, tarry-looking stools  FOLLOW UP: If any biopsies were taken you will be contacted by phone or by letter within the next 1-3 weeks.  Call your gastroenterologist if you have not heard about the biopsies in 3 weeks.  Our staff will call the home number listed on your records the next business day following your procedure to check on you and address any questions or concerns that you may have at that time regarding the information given to you following your procedure. This is a courtesy call and so if there is no answer at the home number and we have not heard from you through the emergency physician on call, we will assume that you have returned to your regular daily activities without incident.  SIGNATURES/CONFIDENTIALITY: You and/or your care partner have signed paperwork which will be entered into your electronic medical record.  These signatures attest to the fact that that the information above on your After  Visit Summary has been reviewed and is understood.  Full responsibility of the confidentiality of this discharge information lies with you and/or your care-partner. 

## 2012-05-26 NOTE — Op Note (Signed)
[facility] [address] [place], [address]   ENDOSCOPY PROCEDURE REPORT  PATIENT: Karen, Merritt  MR#: [id number] BIRTHDATE: January 09, 1971 , 42  yrs. old GENDER: Female ENDOSCOPIST:Tyrelle Raczka Hale Bogus, MD, [facility] REFERRED BY: PROCEDURE DATE:  05/26/2012 PROCEDURE:   EGD w/ biopsy for H.pylori ASA CLASS:    Class II INDICATIONS: Chest pain and Heartburn. MEDICATION: propofol (Diprivan) 300mg  IV TOPICAL ANESTHETIC:   Cetacaine Spray  DESCRIPTION OF PROCEDURE:   After the risks and benefits of the procedure were explained, informed consent was obtained.  The LB GIF-H180 [id number]  endoscope was introduced through the mouth  and advanced to the second portion of the duodenum .  The instrument was slowly withdrawn as the mucosa was fully examined.      DUODENUM: The duodenal mucosa showed no abnormalities in the bulb and second portion of the duodenum.  STOMACH: The mucosa of the stomach appeared normal.  A biopsy was performed for H.pylori exam....  ESOPHAGUS: Reflux esophagitis was found in the lower third of the esophagus.  Esophagitis was LA Grade A: breaks across 1-2 folds, >5 mm.    Retroflexed views revealed a small hiatal hernia.    The scope was then withdrawn from the patient and the procedure completed.  COMPLICATIONS: There were no complications.   ENDOSCOPIC IMPRESSION: 1.   The duodenal mucosa showed no abnormalities in the bulb and second portion of the duodenum 2.   The mucosa of the stomach appeared normal; biopsy for H.pylori done. 3.   Esophagitis consistent with reflux esophagitis in the lower third of the esophagus ,No Barrett's mucosa noted.  RECOMMENDATIONS: 1.  Continue PPI 2.  Continue current meds 3.  Follow-up of helicobacter pylori status, treat if indicated 4.  office F/U 1 month    _______________________________ eSigned:  Mardella Layman, MD, [facility] 05/26/2012 3:09 PM  cc Dr. Beverely Low standard discharge

## 2012-05-26 NOTE — Progress Notes (Signed)
AT END OF PRODCEDURE SOME COUGHING NOTED AND SLIGHT WHEEZING.SAO2 DECREASED TO 75%.ASSISTED VENTILATIONS WITH AMBU FOR LESS THAN ONE MINUTE. TOL WELL. SAO2 IMM. RETURNED TO 96%.

## 2012-05-26 NOTE — Progress Notes (Signed)
Patient did not experience any of the following events: a burn prior to discharge; a fall within the facility; wrong site/side/patient/procedure/implant event; or a hospital transfer or hospital admission upon discharge from the facility. (G8907) Patient did not have preoperative order for IV antibiotic SSI prophylaxis. (G8918)  

## 2012-05-27 ENCOUNTER — Telehealth: Payer: Self-pay | Admitting: *Deleted

## 2012-05-27 NOTE — Telephone Encounter (Signed)
  Follow up Call-  Call back number 05/26/2012  Post procedure Call Back phone  # [phone]  Permission to leave phone message Yes     Patient questions:  Do you have a fever, pain , or abdominal swelling? no Pain Score  0 *  Have you tolerated food without any problems? yes  Have you been able to return to your normal activities? yes  Do you have any questions about your discharge instructions: Diet   no Medications  no Follow up visit  no  Do you have questions or concerns about your Care? no  Actions: * If pain score is 4 or above: No action needed, pain <4.  Patient stating the back of her tongue and one molar is alittle sore, but no problems.

## 2012-05-31 ENCOUNTER — Encounter: Payer: Self-pay | Admitting: Gastroenterology

## 2012-06-01 ENCOUNTER — Ambulatory Visit: Payer: 59 | Admitting: Gastroenterology

## 2012-07-02 ENCOUNTER — Encounter: Payer: Self-pay | Admitting: Gastroenterology

## 2012-07-02 ENCOUNTER — Ambulatory Visit (INDEPENDENT_AMBULATORY_CARE_PROVIDER_SITE_OTHER): Payer: 59 | Admitting: Gastroenterology

## 2012-07-02 VITALS — BP 98/60 | HR 72 | Ht 65.75 in | Wt 187.2 lb

## 2012-07-02 DIAGNOSIS — K219 Gastro-esophageal reflux disease without esophagitis: Secondary | ICD-10-CM

## 2012-07-02 NOTE — Patient Instructions (Addendum)
You watched a video today on acid reflux and information is below. Please purchase Caltrate 600 with Vitamin D and take twice daily. Please follow up with Dr. Jarold Motto in one year or sooner if symptoms reoccur. Continue your Nexium a new prescription was sent to your pharmacy. ___________________________________________________________________________  Diet for Gastroesophageal Reflux Disease, Adult Reflux (acid reflux) is when acid from your stomach flows up into the esophagus. When acid comes in contact with the esophagus, the acid causes irritation and soreness (inflammation) in the esophagus. When reflux happens often or so severely that it causes damage to the esophagus, it is called gastroesophageal reflux disease (GERD). Nutrition therapy can help ease the discomfort of GERD. FOODS OR DRINKS TO AVOID OR LIMIT  Smoking or chewing tobacco. Nicotine is one of the most potent stimulants to acid production in the gastrointestinal tract.  Caffeinated and decaffeinated coffee and black tea.  Regular or low-calorie carbonated beverages or energy drinks (caffeine-free carbonated beverages are allowed).   Strong spices, such as black pepper, white pepper, red pepper, cayenne, curry powder, and chili powder.  Peppermint or spearmint.  Chocolate.  High-fat foods, including meats and fried foods. Extra added fats including oils, butter, salad dressings, and nuts. Limit these to less than 8 tsp per day.  Fruits and vegetables if they are not tolerated, such as citrus fruits or tomatoes.  Alcohol.  Any food that seems to aggravate your condition. If you have questions regarding your diet, call your caregiver or a registered dietitian. OTHER THINGS THAT MAY HELP GERD INCLUDE:   Eating your meals slowly, in a relaxed setting.  Eating 5 to 6 small meals per day instead of 3 large meals.  Eliminating food for a period of time if it causes distress.  Not lying down until 3 hours after  eating a meal.  Keeping the head of your bed raised 6 to 9 inches (15 to 23 cm) by using a foam wedge or blocks under the legs of the bed. Lying flat may make symptoms worse.  Being physically active. Weight loss may be helpful in reducing reflux in overweight or obese adults.  Wear loose fitting clothing EXAMPLE MEAL PLAN This meal plan is approximately 2,000 calories based on https://www.bernard.org/ meal planning guidelines. Breakfast   cup cooked oatmeal.  1 cup strawberries.  1 cup low-fat milk.  1 oz almonds. Snack  1 cup cucumber slices.  6 oz yogurt (made from low-fat or fat-free milk). Lunch  2 slice whole-wheat bread.  2 oz sliced [place].  2 tsp mayonnaise.  1 cup blueberries.  1 cup snap peas. Snack  6 whole-wheat crackers.  1 oz string cheese. Dinner   cup brown rice.  1 cup mixed veggies.  1 tsp olive oil.  3 oz grilled fish. Document Released: 01/06/2005 Document Revised: 03/31/2011 Document Reviewed: 11/22/2010 [employer] Patient Information 2014 [place]. ___________________________________________________________________________                                               We are excited to introduce MyChart, a new best-in-class service that provides you online access to important information in your electronic medical record. We want to make it easier for you to view your health information - all in one secure location - when and where you need it. We expect MyChart will enhance the quality of care and service we provide.  When you register for MyChart, you can:    View your test results.    Request appointments and receive appointment reminders via email.    Request medication renewals.    View your medical history, allergies, medications and immunizations.    Communicate with your physician's office through a password-protected site.    Conveniently print information such as your medication lists.  To find out if MyChart is  right for you, please talk to a member of our clinical staff today. We will gladly answer your questions about this free health and wellness tool.  If you are age 63 or older and want a member of your family to have access to your record, you must provide written consent by completing a proxy form available at our office. Please speak to our clinical staff about guidelines regarding accounts for patients younger than age 52.  As you activate your MyChart account and need any technical assistance, please call the MyChart technical support line at [phone]) or email your question to [email or web address]. If you email your question(s), please include your name, a return phone number and the best time to reach you.  If you have non-urgent health-related questions, you can send a message to our office through MyChart at [email or web address]. If you have a medical emergency, call 911.  Thank you for using MyChart as your new health and wellness resource!   MyChart licensed from [employer],  [id number]. Patents Pending.

## 2012-07-02 NOTE — Progress Notes (Signed)
History of Present Illness: This is a 42 year old Caucasian female with GERD confirmed by recent endoscopy.  Biopsy for H. pylori was negative.  On Nexium 40 mg a day she is asymptomatic and is following a reflux regime without difficulty.  She denies dysphagia or lower gastrointestinal issues.  He does have a benign hepatic hemangioma that we are following.  Colonoscopy was -2 years ago.     Current Medications, Allergies, Past Medical History, Past Surgical History, Family History and Social History were reviewed in [employer] record.  ROS: All systems were reviewed and are negative unless otherwise stated in the HPI.         Assessment and plan: Chronic reflux regime to the patient, and we'll continue daily Nexium as tolerated.  Patient education video concerning GERD and is management shown to the patient.  We will see her on a when necessary basis as needed. No diagnosis found.

## 2012-08-10 ENCOUNTER — Telehealth: Payer: Self-pay | Admitting: Family Medicine

## 2012-08-10 MED ORDER — LEVOTHYROXINE SODIUM 100 MCG PO TABS: 100.0000 ug | ORAL_TABLET | Freq: Every day | ORAL | Status: DC

## 2012-08-10 NOTE — Telephone Encounter (Signed)
Patient is calling asking for her Synthroid Rx to be sent to her local pharmacy until her mail order prescription can be delivered. She states that she needs at least 10 days worth sent because she takes it everyday and is completely out today. Her mail order pharmacy told her that she was not set up for it to be auto refilled and gave her an estimated delivery of 7-10 days. Patient uses [facility] on [address].

## 2012-08-10 NOTE — Telephone Encounter (Signed)
Rx sent to local pharmacy by e-script.//AB/CMA

## 2012-09-06 ENCOUNTER — Other Ambulatory Visit: Payer: Self-pay | Admitting: Family Medicine

## 2012-09-06 NOTE — Telephone Encounter (Signed)
Med filled.  

## 2012-09-09 ENCOUNTER — Encounter: Payer: Self-pay | Admitting: Family Medicine

## 2012-09-09 ENCOUNTER — Ambulatory Visit (INDEPENDENT_AMBULATORY_CARE_PROVIDER_SITE_OTHER): Payer: 59 | Admitting: Family Medicine

## 2012-09-09 VITALS — BP 118/82 | HR 64 | Temp 98.7°F | Ht 66.0 in | Wt 196.8 lb

## 2012-09-09 DIAGNOSIS — J329 Chronic sinusitis, unspecified: Secondary | ICD-10-CM

## 2012-09-09 MED ORDER — PROMETHAZINE-DM 6.25-15 MG/5ML PO SYRP: 5.0000 mL | ORAL_SOLUTION | Freq: Four times a day (QID) | ORAL | Status: DC | PRN

## 2012-09-09 MED ORDER — AMOXICILLIN 875 MG PO TABS: 875.0000 mg | ORAL_TABLET | Freq: Two times a day (BID) | ORAL | Status: AC

## 2012-09-09 NOTE — Progress Notes (Signed)
  Subjective:    Patient ID: Karen Merritt, female    DOB: Dec 14, 1970, 42 y.o.   MRN: [id number]  HPI URI- sxs started Saturday w/ 'real bad HA'.  Monday continued to have HA and then developed ear pain and facial pressure.  + cough.  No fever.  + nausea, no V/D.  + sick contacts.  + tooth pain.   Review of Systems For ROS see HPI     Objective:   Physical Exam  Vitals reviewed. Constitutional: She appears well-developed and well-nourished. No distress.  HENT:  Head: Normocephalic and atraumatic.  Right Ear: Tympanic membrane normal.  Left Ear: Tympanic membrane normal.  Nose: Mucosal edema and rhinorrhea present. Right sinus exhibits maxillary sinus tenderness and frontal sinus tenderness. Left sinus exhibits maxillary sinus tenderness and frontal sinus tenderness.  Mouth/Throat: Uvula is midline and mucous membranes are normal. Posterior oropharyngeal erythema present. No oropharyngeal exudate.  Eyes: Conjunctivae and EOM are normal. Pupils are equal, round, and reactive to light.  Neck: Normal range of motion. Neck supple.  Cardiovascular: Normal rate, regular rhythm and normal heart sounds.   Pulmonary/Chest: Effort normal and breath sounds normal. No respiratory distress. She has no wheezes.  Lymphadenopathy:    She has no cervical adenopathy.          Assessment & Plan:

## 2012-09-09 NOTE — Patient Instructions (Addendum)
This is a sinus infection Start the Amoxicillin twice daily- take w/ food Drink plenty of fluids Mucinex DM for daytime cough Cough syrup for nights and weekends- will cause drowsiness REST! Hang in there!

## 2012-09-09 NOTE — Assessment & Plan Note (Signed)
Pt's sxs and PE consistent w/ infxn.  Start abx.  Cough meds prn.  Reviewed supportive care and red flags that should prompt return.  Pt expressed understanding and is in agreement w/ plan.  

## 2012-11-20 LAB — HM COLONOSCOPY: HM COLON: NORMAL

## 2012-11-20 LAB — HM MAMMOGRAPHY: HM MAMMO: NORMAL

## 2012-11-20 LAB — HM PAP SMEAR: HM PAP: NORMAL

## 2012-11-25 ENCOUNTER — Other Ambulatory Visit: Payer: Self-pay

## 2012-11-25 ENCOUNTER — Other Ambulatory Visit: Payer: Self-pay | Admitting: Family Medicine

## 2012-11-25 NOTE — Telephone Encounter (Signed)
Med filled and faxed.  

## 2012-11-25 NOTE — Telephone Encounter (Signed)
Patient wants for Dr. Beverely Low to know that her cyclobenzaprine (FLEXERIL) 10 MG tablet rx has expired and she threw them away which is why she needs it refilled. She states that she had an episode last night as well.

## 2012-11-25 NOTE — Telephone Encounter (Signed)
Last OV 09-09-12 Flexeril filled 07-31-11 #30 with 0 (See note below)  ambien 02-06-12 #30 with 3

## 2012-12-23 ENCOUNTER — Telehealth: Payer: Self-pay | Admitting: Family Medicine

## 2012-12-23 DIAGNOSIS — K594 Anal spasm: Secondary | ICD-10-CM

## 2012-12-23 NOTE — Telephone Encounter (Signed)
Patient wants to know if she can have a referral to see a specialist for her muscle spasms. She says that Dr. Jarold Motto has tried to prescribe her medicine to assist with her pain but it is not covered by her insurance and she did not want to have to take the medicine everyday when her spasms are random. Patient states that she has been taking her Flexeril but she is at work "high as a Information systems manager" because she had to take some after her episode last night. Please advise.

## 2012-12-23 NOTE — Telephone Encounter (Signed)
Please advise 

## 2012-12-23 NOTE — Telephone Encounter (Signed)
Referral placed.

## 2012-12-23 NOTE — Telephone Encounter (Signed)
We can refer her to a different GI for a 2nd opinion- [facility] would probably be best (dx rectal spasm)

## 2013-02-07 ENCOUNTER — Encounter: Payer: 59 | Admitting: Family Medicine

## 2013-02-12 ENCOUNTER — Other Ambulatory Visit: Payer: Self-pay | Admitting: Family Medicine

## 2013-02-14 NOTE — Telephone Encounter (Signed)
Levothyroxine refilled per protocol. JG//CMA 

## 2013-02-23 ENCOUNTER — Encounter: Payer: 59 | Admitting: Family Medicine

## 2013-04-12 ENCOUNTER — Telehealth: Payer: Self-pay

## 2013-04-12 NOTE — Telephone Encounter (Signed)
Left message for call back Non-identifiable   Pap- [facility]- 11/21/09-external hemorrhoids; repeat in 10 yrs Flu Td

## 2013-04-13 NOTE — Telephone Encounter (Signed)
Medication and allergies:  Reviewed and updated  90 day supply/mail order: [facility] (synthroid only) Local pharmacy:    Immunizations due:  Unsure of last Tdap  A/P:   No changes to FH, PSH or Personal Hx Flu vaccine--11/2012 Tdap--unsure Pap--11/2012--Dr Kaplan--nml MMG--11/2012--Dr Kaplan--nml [facility]CS--11/2009--external hemorrhoids--next due 2021  To Discuss with Provider: Not at this time

## 2013-04-15 ENCOUNTER — Encounter: Payer: Self-pay | Admitting: Family Medicine

## 2013-04-15 ENCOUNTER — Ambulatory Visit (INDEPENDENT_AMBULATORY_CARE_PROVIDER_SITE_OTHER): Payer: 59 | Admitting: Family Medicine

## 2013-04-15 VITALS — BP 110/76 | HR 74 | Temp 98.2°F | Resp 16 | Ht 66.0 in | Wt 198.5 lb

## 2013-04-15 DIAGNOSIS — Z0000 Encounter for general adult medical examination without abnormal findings: Secondary | ICD-10-CM

## 2013-04-15 DIAGNOSIS — Z23 Encounter for immunization: Secondary | ICD-10-CM

## 2013-04-15 LAB — CBC WITH DIFFERENTIAL/PLATELET
Basophils Absolute: 0 10*3/uL (ref 0.0–0.1)
Basophils Relative: 0.7 % (ref 0.0–3.0)
EOS PCT: 1.5 % (ref 0.0–5.0)
Eosinophils Absolute: 0.1 10*3/uL (ref 0.0–0.7)
HCT: 40.6 % (ref 36.0–46.0)
Hemoglobin: 13.5 g/dL (ref 12.0–15.0)
LYMPHS PCT: 35 % (ref 12.0–46.0)
Lymphs Abs: 2.5 10*3/uL (ref 0.7–4.0)
MCHC: 33.3 g/dL (ref 30.0–36.0)
MCV: 81.3 fl (ref 78.0–100.0)
MONOS PCT: 7 % (ref 3.0–12.0)
Monocytes Absolute: 0.5 10*3/uL (ref 0.1–1.0)
NEUTROS PCT: 55.8 % (ref 43.0–77.0)
Neutro Abs: 4 10*3/uL (ref 1.4–7.7)
PLATELETS: 235 10*3/uL (ref 150.0–400.0)
RBC: 4.99 Mil/uL (ref 3.87–5.11)
RDW: 15.2 % — ABNORMAL HIGH (ref 11.5–14.6)
WBC: 7.2 10*3/uL (ref 4.5–10.5)

## 2013-04-15 LAB — BASIC METABOLIC PANEL
BUN: 11 mg/dL (ref 6–23)
CHLORIDE: 105 meq/L (ref 96–112)
CO2: 26 meq/L (ref 19–32)
Calcium: 9 mg/dL (ref 8.4–10.5)
Creatinine, Ser: 0.8 mg/dL (ref 0.4–1.2)
GFR: 84.92 mL/min (ref >60.00)
GLUCOSE: 86 mg/dL (ref 70–99)
POTASSIUM: 4 meq/L (ref 3.5–5.1)
Sodium: 137 mEq/L (ref 135–145)

## 2013-04-15 LAB — TSH: TSH: 3.44 u[IU]/mL (ref 0.35–5.50)

## 2013-04-15 LAB — HEPATIC FUNCTION PANEL
ALBUMIN: 3.8 g/dL (ref 3.5–5.2)
ALT: 32 U/L (ref 0–35)
AST: 24 U/L (ref 0–37)
Alkaline Phosphatase: 50 U/L (ref 39–117)
Bilirubin, Direct: 0 mg/dL (ref 0.0–0.3)
TOTAL PROTEIN: 6.8 g/dL (ref 6.0–8.3)
Total Bilirubin: 0.6 mg/dL (ref 0.3–1.2)

## 2013-04-15 LAB — LIPID PANEL
CHOLESTEROL: 185 mg/dL (ref 0–200)
HDL: 31.6 mg/dL — ABNORMAL LOW (ref >39.00)
LDL Cholesterol: 130 mg/dL — ABNORMAL HIGH (ref 0–99)
Total CHOL/HDL Ratio: 6
Triglycerides: 117 mg/dL (ref 0.0–149.0)
VLDL: 23.4 mg/dL (ref 0.0–40.0)

## 2013-04-15 NOTE — Progress Notes (Signed)
   Subjective:    Patient ID: Karen Merritt, female    DOB: [birth year], 43 y.o.   MRN: [id number]  HPI CPE- UTD on pap, mammo (GYN- Deatra Ina), Colonoscopy- Sharlett Iles   Review of Systems Patient reports no vision/ hearing changes, adenopathy,fever, weight change,  persistant/recurrent hoarseness , swallowing issues, chest pain, edema, persistant/recurrent cough, hemoptysis, dyspnea (rest/exertional/paroxysmal nocturnal), gastrointestinal bleeding (melena, rectal bleeding), abdominal pain, significant heartburn, bowel changes, GU symptoms (dysuria, hematuria, incontinence), Gyn symptoms (abnormal  bleeding, pain),  syncope, focal weakness, memory loss, numbness & tingling, skin/hair/nail changes, abnormal bruising or bleeding, anxiety, or depression.  + palpitations    Objective:   Physical Exam General Appearance:    Alert, cooperative, no distress, appears stated age  Head:    Normocephalic, without obvious abnormality, atraumatic  Eyes:    PERRL, conjunctiva/corneas clear, EOM's intact, fundi    benign, both eyes  Ears:    Normal TM's and external ear canals, both ears  Nose:   Nares normal, septum midline, mucosa normal, no drainage    or sinus tenderness  Throat:   Lips, mucosa, and tongue normal; teeth and gums normal  Neck:   Supple, symmetrical, trachea midline, no adenopathy;    Thyroid: no enlargement/tenderness/nodules  Back:     Symmetric, no curvature, ROM normal, no CVA tenderness  Lungs:     Clear to auscultation bilaterally, respirations unlabored  Chest Wall:    No tenderness or deformity   Heart:    Regular rate and rhythm, S1 and S2 normal, no murmur, rub   or gallop  Breast Exam:    Deferred to GYN  Abdomen:     Soft, non-tender, bowel sounds active all four quadrants,    no masses, no organomegaly  Genitalia:    Deferred to GYN  Rectal:    Extremities:   Extremities normal, atraumatic, no cyanosis or edema  Pulses:   2+ and symmetric all extremities  Skin:   Skin  color, texture, turgor normal, no rashes or lesions  Lymph nodes:   Cervical, supraclavicular, and axillary nodes normal  Neurologic:   CNII-XII intact, normal strength, sensation and reflexes    throughout          Assessment & Plan:

## 2013-04-15 NOTE — Progress Notes (Signed)
Pre visit review using our clinic review tool, if applicable. No additional management support is needed unless otherwise documented below in the visit note. 

## 2013-04-15 NOTE — Assessment & Plan Note (Signed)
Pt's PE WNL.  UTD on GYN, colonoscopy.  Check labs.  EKG done- see document for interpretation.  Anticipatory guidance provided.

## 2013-04-15 NOTE — Patient Instructions (Signed)
Follow up in 1 year or as needed Keep up the good work!  You look great! We'll notify you of your lab results and make any changes if needed Call with any questions or concerns [place]!

## 2013-04-21 LAB — VITAMIN D 1,25 DIHYDROXY
VITAMIN D 1, 25 (OH) TOTAL: 40 pg/mL (ref 18–72)
VITAMIN D3 1, 25 (OH): 40 pg/mL
Vitamin D2 1, 25 (OH)2: <8 pg/mL

## 2013-04-22 ENCOUNTER — Other Ambulatory Visit: Payer: Self-pay | Admitting: Family Medicine

## 2013-04-25 NOTE — Telephone Encounter (Signed)
Med filled.  

## 2013-05-12 ENCOUNTER — Encounter (HOSPITAL_COMMUNITY): Payer: Self-pay

## 2013-05-12 ENCOUNTER — Encounter: Payer: Self-pay | Admitting: Family Medicine

## 2013-05-13 ENCOUNTER — Encounter: Payer: Self-pay | Admitting: Family Medicine

## 2013-05-18 ENCOUNTER — Encounter (HOSPITAL_COMMUNITY)
Admission: RE | Admit: 2013-05-18 | Discharge: 2013-05-18 | Disposition: A | Discharge status: Home / Self Care | Payer: 59 | Source: Ambulatory Visit | Attending: Orthopedic Surgery | Admitting: Orthopedic Surgery

## 2013-05-18 ENCOUNTER — Encounter (HOSPITAL_COMMUNITY): Payer: Self-pay

## 2013-05-18 DIAGNOSIS — Z01812 Encounter for preprocedural laboratory examination: Secondary | ICD-10-CM | POA: Insufficient documentation

## 2013-05-18 LAB — CBC
HCT: 38.8 % (ref 36.0–46.0)
HEMOGLOBIN: 14.3 g/dL (ref 12.0–15.0)
MCH: 28 pg (ref 26.0–34.0)
MCHC: 36.9 g/dL — ABNORMAL HIGH (ref 30.0–36.0)
MCV: 76.1 fL — AB (ref 78.0–100.0)
Platelets: 241 10*3/uL (ref 150–400)
RBC: 5.1 MIL/uL (ref 3.87–5.11)
RDW: 15.3 % (ref 11.5–15.5)
WBC: 10.1 10*3/uL (ref 4.0–10.5)

## 2013-05-18 LAB — BASIC METABOLIC PANEL
BUN: 10 mg/dL (ref 6–23)
CALCIUM: 9.1 mg/dL (ref 8.4–10.5)
CO2: 26 mEq/L (ref 19–32)
Chloride: 105 mEq/L (ref 96–112)
Creatinine, Ser: 0.64 mg/dL (ref 0.50–1.10)
GFR calc Af Amer: >90 mL/min (ref >90)
Glucose, Bld: 96 mg/dL (ref 70–99)
Potassium: 4 mEq/L (ref 3.7–5.3)
SODIUM: 142 meq/L (ref 137–147)

## 2013-05-18 MED ORDER — CHLORHEXIDINE GLUCONATE 4 % EX LIQD: 60.0000 mL | Freq: Once | CUTANEOUS | Status: DC

## 2013-05-18 NOTE — Pre-Procedure Instructions (Signed)
Shamonica Schadt  05/18/2013   Your procedure is scheduled on:  Monday May 4, at 1314 PM  Report to [facility] [place] Entrance "A" at [phone].  Call this number if you have problems the morning of surgery: [phone]   Remember:   Do not eat food or drink liquids after midnight.   Take these medicines the morning of surgery with A SIP OF WATER: Synthroid   Do not wear jewelry, make-up or nail polish.  Do not wear lotions, powders, or perfumes. You may wear deodorant.  Do not shave 48 hours prior to surgery.   Do not bring valuables to the hospital.  [facility] is not responsible for any belongings or valuables.               Contacts, dentures or bridgework may not be worn into surgery.  Leave suitcase in the car. After surgery it may be brought to your room.  For patients admitted to the hospital, discharge time is determined by your treatment team.               Patients discharged the day of surgery will not be allowed to drive home.    Special Instructions: [facility] - Preparing for Surgery  Before surgery, you can play an important role.  Because skin is not sterile, your skin needs to be as free of germs as possible.  You can reduce the number of germs on you skin by washing with CHG (chlorahexidine gluconate) soap before surgery.  CHG is an antiseptic cleaner which kills germs and bonds with the skin to continue killing germs even after washing.  Please DO NOT use if you have an allergy to CHG or antibacterial soaps.  If your skin becomes reddened/irritated stop using the CHG and inform your nurse when you arrive at Short Stay.  Do not shave (including legs and underarms) for at least 48 hours prior to the first CHG shower.  You may shave your face.  Please follow these instructions carefully:   1.  Shower with CHG Soap the night before surgery and the                                morning of Surgery.  2.  If you choose to wash your hair, wash your hair first as usual with  your       normal shampoo.  3.  After you shampoo, rinse your hair and body thoroughly to remove the                      Shampoo.  4.  Use CHG as you would any other liquid soap.  You can apply chg directly       to the skin and wash gently with scrungie or a clean washcloth.  5.  Apply the CHG Soap to your body ONLY FROM THE NECK DOWN.        Do not use on open wounds or open sores.  Avoid contact with your eyes,       ears, mouth and genitals (private parts).  Wash genitals (private parts)       with your normal soap.  6.  Wash thoroughly, paying special attention to the area where your surgery        will be performed.  7.  Thoroughly rinse your body with warm water from the neck down.  8.  DO  NOT shower/wash with your normal soap after using and rinsing off       the CHG Soap.  9.  Pat yourself dry with a clean towel.            10.  Wear clean pajamas.            11.  Place clean sheets on your bed the night of your first shower and do not        sleep with pets.  Day of Surgery  Do not apply any lotions/deoderants the morning of surgery.  Please wear clean clothes to the hospital/surgery center.      Please read over the following fact sheets that you were given: Pain Booklet, Coughing and Deep Breathing and Surgical Site Infection Prevention

## 2013-05-18 NOTE — H&P (Signed)
  Karen Merritt is an 43 y.o. female.    Chief Complaint: right shoulder pain  HPI: Pt is a 43 y.o. female complaining of right shoulder pain for several weeks. Pain had continually increased since the beginning. X-rays in the clinic show rotator cuff tear right shoulder. Pt has tried various conservative treatments which have failed to alleviate their symptoms, including therapy and injections. Various options are discussed with the patient. Risks, benefits and expectations were discussed with the patient. Patient understand the risks, benefits and expectations and wishes to proceed with surgery.   PCP:  Annye Asa, MD  D/C Plans:  Home   PMH: Past Medical History  Diagnosis Date  . Hypothyroidism   . Chest pain     non-cardiac 2005, 2012.  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 Hemorrhoids 11-21-2009    colonoscopy    PSH: Past Surgical History  Procedure Laterality Date  . Thyroidectomy  12/29/96  . Stress cardiolite  05/23/03  . Total abdominal hysterectomy  2008  . Bone spur  6/11    right big toe    Social History:  reports that she has never smoked. She has never used smokeless tobacco. She reports that she does not drink alcohol or use illicit drugs.  Allergies:  No Known Allergies  Medications: No current facility-administered medications for this encounter.   Current Outpatient Prescriptions  Medication Sig Dispense Refill  . cyclobenzaprine (FLEXERIL) 10 MG tablet Take 10 mg by mouth 3 (three) times daily as needed for muscle spasms.      Marland Kitchen ibuprofen (ADVIL,MOTRIN) 200 MG tablet Take 200 mg by mouth every 6 (six) hours as needed for moderate pain.      Marland Kitchen levothyroxine (SYNTHROID, LEVOTHROID) 100 MCG tablet Take 100 mcg by mouth daily before breakfast.      . Melatonin 5 MG TABS Take 5 mg by mouth at bedtime as needed (sleep).      . promethazine (PHENERGAN) 25 MG tablet Take 1 tablet (25 mg total) by mouth every 8 (eight) hours as needed  for nausea.  20 tablet  0  . zolpidem (AMBIEN) 10 MG tablet Take 10 mg by mouth at bedtime as needed for sleep.        No results found for this or any previous visit (from the past 48 hour(s)). No results found.  ROS: Pain with rom of the right upper extremity  Physical Exam:  Alert and oriented 43 y.o. female in no acute distress Cranial nerves 2-12 intact Cervical spine: full rom with no tenderness, nv intact distally Chest: active breath sounds bilaterally, no wheeze rhonchi or rales Heart: regular rate and rhythm, no murmur Abd: non tender non distended with active bowel sounds Hip is stable with rom  Mild limitation and mild weakness right upper extremity nv intact distally No rashes or edema  Assessment/Plan Assessment: right shoulder pain secondary to rotator cuff tear  Plan: Patient will undergo a right shoulder scope with rotator cuff repair by Dr. Veverly Fells at [facility]. Risks benefits and expectations were discussed with the patient. Patient understand risks, benefits and expectations and wishes to proceed.

## 2013-05-19 NOTE — Progress Notes (Signed)
Anesthesia Chart Review:  Patient is a 43 year old female scheduled for right shoulder arthroscopy, subacromial decompression, open distal clavicle resection, open rotator cuff repair, possible biceps tenodesis on 05/23/13 by Dr. Veverly Fells.  History includes thyroidectomy for multinodular goiter with secondary hypothyroidism, previous chest pain (2005 and 2012; felt to be non-cardiac--negative ER work-up in 02/2010 with PCP follow-up diagnosis of costochondritis), hysterectomy, non-smoker, PCP is Dr. Annye Asa, with complete physical exam on 04/15/13.  Findings felt WNL and one year follow-up recommended. She denied chest pain at that time.  EKG on 04/15/13 showed NSR.  Preoperative labs noted.  If no acute changes then I would anticipate that she could proceed as planned.  George Hugh [facility]/Anesthesiology Phone [phone] 05/19/2013 12:30 PM

## 2013-05-22 MED ORDER — CEFAZOLIN SODIUM-DEXTROSE 2-3 GM-% IV SOLR: 2.0000 g | INTRAVENOUS | Status: AC

## 2013-05-23 ENCOUNTER — Ambulatory Visit (HOSPITAL_COMMUNITY)
Admission: RE | Admit: 2013-05-23 | Discharge: 2013-05-23 | Disposition: A | Discharge status: Home / Self Care | Payer: 59 | Source: Ambulatory Visit | Attending: Orthopedic Surgery | Admitting: Orthopedic Surgery

## 2013-05-23 ENCOUNTER — Encounter (HOSPITAL_COMMUNITY): Payer: Self-pay | Admitting: *Deleted

## 2013-05-23 ENCOUNTER — Encounter (HOSPITAL_COMMUNITY): Payer: 59 | Admitting: Vascular Surgery

## 2013-05-23 ENCOUNTER — Encounter (HOSPITAL_COMMUNITY)
Admission: RE | Disposition: A | Discharge status: Home / Self Care | Payer: Self-pay | Source: Ambulatory Visit | Attending: Orthopedic Surgery

## 2013-05-23 ENCOUNTER — Ambulatory Visit (HOSPITAL_COMMUNITY): Payer: 59 | Admitting: Anesthesiology

## 2013-05-23 DIAGNOSIS — S43429A Sprain of unspecified rotator cuff capsule, initial encounter: Secondary | ICD-10-CM | POA: Insufficient documentation

## 2013-05-23 DIAGNOSIS — Z79899 Other long term (current) drug therapy: Secondary | ICD-10-CM | POA: Insufficient documentation

## 2013-05-23 DIAGNOSIS — S43439A Superior glenoid labrum lesion of unspecified shoulder, initial encounter: Secondary | ICD-10-CM | POA: Insufficient documentation

## 2013-05-23 DIAGNOSIS — Y929 Unspecified place or not applicable: Secondary | ICD-10-CM | POA: Insufficient documentation

## 2013-05-23 DIAGNOSIS — X58XXXA Exposure to other specified factors, initial encounter: Secondary | ICD-10-CM

## 2013-05-23 DIAGNOSIS — Y93H1 Activity, digging, shoveling and raking: Secondary | ICD-10-CM | POA: Insufficient documentation

## 2013-05-23 DIAGNOSIS — S46819A Strain of other muscles, fascia and tendons at shoulder and upper arm level, unspecified arm, initial encounter: Secondary | ICD-10-CM | POA: Insufficient documentation

## 2013-05-23 DIAGNOSIS — X503XXA Overexertion from repetitive movements, initial encounter: Secondary | ICD-10-CM | POA: Insufficient documentation

## 2013-05-23 DIAGNOSIS — E039 Hypothyroidism, unspecified: Secondary | ICD-10-CM | POA: Insufficient documentation

## 2013-05-23 DIAGNOSIS — M19019 Primary osteoarthritis, unspecified shoulder: Secondary | ICD-10-CM | POA: Insufficient documentation

## 2013-05-23 DIAGNOSIS — M9420 Chondromalacia, unspecified site: Secondary | ICD-10-CM | POA: Insufficient documentation

## 2013-05-23 HISTORY — PX: SHOULDER ARTHROSCOPY WITH SUBACROMIAL DECOMPRESSION AND OPEN ROTATOR C: SHX5688

## 2013-05-23 SURGERY — SHOULDER ARTHROSCOPY WITH SUBACROMIAL DECOMPRESSION AND OPEN ROTATOR CUFF REPAIR, OPEN BICEPS TENDON REPAIR
Anesthesia: General | Site: Shoulder | Laterality: Right

## 2013-05-23 MED ORDER — MIDAZOLAM HCL 2 MG/2ML IJ SOLN
INTRAMUSCULAR | Status: AC
  Filled 2013-05-23: qty 2

## 2013-05-23 MED ORDER — ARTIFICIAL TEARS OP OINT
TOPICAL_OINTMENT | OPHTHALMIC | Status: DC | PRN
  Administered 2013-05-23: 1 via OPHTHALMIC

## 2013-05-23 MED ORDER — BUPIVACAINE-EPINEPHRINE 0.25% -1:200000 IJ SOLN
INTRAMUSCULAR | Status: DC | PRN
  Administered 2013-05-23: 30 mL

## 2013-05-23 MED ORDER — FENTANYL CITRATE 0.05 MG/ML IJ SOLN
INTRAMUSCULAR | Status: AC
  Filled 2013-05-23: qty 5

## 2013-05-23 MED ORDER — ONDANSETRON HCL 4 MG/2ML IJ SOLN
INTRAMUSCULAR | Status: DC | PRN
  Administered 2013-05-23: 4 mg via INTRAVENOUS

## 2013-05-23 MED ORDER — PROPOFOL 10 MG/ML IV BOLUS
INTRAVENOUS | Status: AC
  Filled 2013-05-23: qty 20

## 2013-05-23 MED ORDER — KETOROLAC TROMETHAMINE 30 MG/ML IJ SOLN
INTRAMUSCULAR | Status: AC
  Filled 2013-05-23: qty 1

## 2013-05-23 MED ORDER — FENTANYL CITRATE 0.05 MG/ML IJ SOLN
50.0000 ug | Freq: Once | INTRAMUSCULAR | Status: AC
  Administered 2013-05-23: 100 ug via INTRAVENOUS

## 2013-05-23 MED ORDER — METAXALONE 800 MG PO TABS: 800.0000 mg | ORAL_TABLET | Freq: Four times a day (QID) | ORAL | Status: DC | PRN

## 2013-05-23 MED ORDER — PROPOFOL 10 MG/ML IV BOLUS
INTRAVENOUS | Status: DC | PRN
  Administered 2013-05-23: 200 mg via INTRAVENOUS

## 2013-05-23 MED ORDER — GLYCOPYRROLATE 0.2 MG/ML IJ SOLN
INTRAMUSCULAR | Status: AC
  Filled 2013-05-23: qty 3

## 2013-05-23 MED ORDER — OXYCODONE-ACETAMINOPHEN 5-325 MG PO TABS: 1.0000 | ORAL_TABLET | ORAL | Status: DC | PRN

## 2013-05-23 MED ORDER — ROCURONIUM BROMIDE 50 MG/5ML IV SOLN
INTRAVENOUS | Status: AC
  Filled 2013-05-23: qty 1

## 2013-05-23 MED ORDER — ARTIFICIAL TEARS OP OINT
TOPICAL_OINTMENT | OPHTHALMIC | Status: AC
  Filled 2013-05-23: qty 3.5

## 2013-05-23 MED ORDER — HYDROMORPHONE HCL PF 1 MG/ML IJ SOLN: 0.2500 mg | INTRAMUSCULAR | Status: DC | PRN

## 2013-05-23 MED ORDER — SODIUM CHLORIDE 0.9 % IR SOLN
Status: DC | PRN
  Administered 2013-05-23: 1

## 2013-05-23 MED ORDER — PHENYLEPHRINE HCL 10 MG/ML IJ SOLN
INTRAMUSCULAR | Status: DC | PRN
  Administered 2013-05-23 (×2): 80 ug via INTRAVENOUS

## 2013-05-23 MED ORDER — BUPIVACAINE-EPINEPHRINE (PF) 0.25% -1:200000 IJ SOLN
INTRAMUSCULAR | Status: AC
  Filled 2013-05-23: qty 30

## 2013-05-23 MED ORDER — LIDOCAINE HCL (CARDIAC) 20 MG/ML IV SOLN
INTRAVENOUS | Status: AC
  Filled 2013-05-23: qty 5

## 2013-05-23 MED ORDER — FENTANYL CITRATE 0.05 MG/ML IJ SOLN
INTRAMUSCULAR | Status: AC
  Filled 2013-05-23: qty 2

## 2013-05-23 MED ORDER — GLYCOPYRROLATE 0.2 MG/ML IJ SOLN
INTRAMUSCULAR | Status: DC | PRN
  Administered 2013-05-23: 0.4 mg via INTRAVENOUS

## 2013-05-23 MED ORDER — FENTANYL CITRATE 0.05 MG/ML IJ SOLN
INTRAMUSCULAR | Status: DC | PRN
  Administered 2013-05-23: 100 ug via INTRAVENOUS
  Administered 2013-05-23: 50 ug via INTRAVENOUS

## 2013-05-23 MED ORDER — PROMETHAZINE HCL 25 MG/ML IJ SOLN
6.2500 mg | INTRAMUSCULAR | Status: DC | PRN
  Administered 2013-05-23: 6.25 mg via INTRAVENOUS

## 2013-05-23 MED ORDER — BUPIVACAINE-EPINEPHRINE (PF) 0.5% -1:200000 IJ SOLN
INTRAMUSCULAR | Status: DC | PRN
  Administered 2013-05-23: 25 mL

## 2013-05-23 MED ORDER — PHENYLEPHRINE HCL 10 MG/ML IJ SOLN
10.0000 mg | INTRAVENOUS | Status: DC | PRN
  Administered 2013-05-23: 25 ug/min via INTRAVENOUS

## 2013-05-23 MED ORDER — NEOSTIGMINE METHYLSULFATE 10 MG/10ML IV SOLN
INTRAVENOUS | Status: DC | PRN
  Administered 2013-05-23: 3 mg via INTRAVENOUS

## 2013-05-23 MED ORDER — CEFAZOLIN SODIUM-DEXTROSE 2-3 GM-% IV SOLR
INTRAVENOUS | Status: DC | PRN
  Administered 2013-05-23: 2 g via INTRAVENOUS

## 2013-05-23 MED ORDER — OXYCODONE HCL 5 MG/5ML PO SOLN: 5.0000 mg | Freq: Once | ORAL | Status: DC | PRN

## 2013-05-23 MED ORDER — NEOSTIGMINE METHYLSULFATE 10 MG/10ML IV SOLN
INTRAVENOUS | Status: AC
  Filled 2013-05-23: qty 1

## 2013-05-23 MED ORDER — LIDOCAINE HCL (CARDIAC) 20 MG/ML IV SOLN
INTRAVENOUS | Status: DC | PRN
  Administered 2013-05-23: 100 mg via INTRAVENOUS

## 2013-05-23 MED ORDER — KETOROLAC TROMETHAMINE 30 MG/ML IJ SOLN
INTRAMUSCULAR | Status: DC | PRN
  Administered 2013-05-23: 30 mg via INTRAVENOUS

## 2013-05-23 MED ORDER — ROCURONIUM BROMIDE 100 MG/10ML IV SOLN
INTRAVENOUS | Status: DC | PRN
  Administered 2013-05-23: 50 mg via INTRAVENOUS

## 2013-05-23 MED ORDER — LACTATED RINGERS IV SOLN
INTRAVENOUS | Status: DC | PRN
  Administered 2013-05-23 (×2): via INTRAVENOUS

## 2013-05-23 MED ORDER — PROMETHAZINE HCL 25 MG/ML IJ SOLN
INTRAMUSCULAR | Status: AC
  Filled 2013-05-23: qty 1

## 2013-05-23 MED ORDER — LACTATED RINGERS IV SOLN
INTRAVENOUS | Status: DC
  Administered 2013-05-23: 12:00:00 via INTRAVENOUS

## 2013-05-23 MED ORDER — ONDANSETRON HCL 4 MG/2ML IJ SOLN
INTRAMUSCULAR | Status: AC
  Filled 2013-05-23: qty 2

## 2013-05-23 MED ORDER — MIDAZOLAM HCL 2 MG/2ML IJ SOLN
1.0000 mg | INTRAMUSCULAR | Status: DC | PRN
  Administered 2013-05-23: 2 mg via INTRAVENOUS

## 2013-05-23 MED ORDER — OXYCODONE HCL 5 MG PO TABS: 5.0000 mg | ORAL_TABLET | Freq: Once | ORAL | Status: DC | PRN

## 2013-05-23 SURGICAL SUPPLY — 66 items
ANCH SUT 2 1.3X1 LD 1 STRN (Anchor) ×1 IMPLANT
ANCH SUT 360D 2 2 LD 2 STRN (Anchor) ×1 IMPLANT
ANCHOR ALL- SUT RC 2 SUT Y-K (Anchor) ×1 IMPLANT
ANCHOR ALL-SUT FLEX 1.3 Y-KNOT (Anchor) ×1 IMPLANT
ANCHOR ALL-SUT RC 2 SUT Y-K (Anchor) IMPLANT
BIT DRILL 1.3M DISPOSABLE (BIT) ×1 IMPLANT
BLADE 10 SAFETY STRL DISP (BLADE) ×2 IMPLANT
BLADE SURG 11 STRL SS (BLADE) ×2 IMPLANT
BUR OVAL 4.0 (BURR) ×1 IMPLANT
COVER SURGICAL LIGHT HANDLE (MISCELLANEOUS) ×2 IMPLANT
DRAPE INCISE IOBAN 66X45 STRL (DRAPES) ×2 IMPLANT
DRAPE STERI 35X30 U-POUCH (DRAPES) ×2 IMPLANT
DRAPE U-SHAPE 47X51 STRL (DRAPES) ×2 IMPLANT
DRSG EMULSION OIL 3X3 NADH (GAUZE/BANDAGES/DRESSINGS) ×3 IMPLANT
DRSG PAD ABDOMINAL 8X10 ST (GAUZE/BANDAGES/DRESSINGS) ×4 IMPLANT
DURAPREP 26ML APPLICATOR (WOUND CARE) ×2 IMPLANT
ELECT NDL TIP 2.8 STRL (NEEDLE) IMPLANT
ELECT NEEDLE TIP 2.8 STRL (NEEDLE) ×2 IMPLANT
ELECT REM PT RETURN 9FT ADLT (ELECTROSURGICAL)
ELECTRODE REM PT RTRN 9FT ADLT (ELECTROSURGICAL) IMPLANT
GLOVE BIOGEL PI IND STRL 7.0 (GLOVE) IMPLANT
GLOVE BIOGEL PI INDICATOR 7.0 (GLOVE) ×1
GLOVE BIOGEL PI ORTHO PRO 7.5 (GLOVE) ×1
GLOVE BIOGEL PI ORTHO PRO SZ8 (GLOVE) ×2
GLOVE ORTHO TXT STRL SZ7.5 (GLOVE) ×2 IMPLANT
GLOVE PI ORTHO PRO STRL 7.5 (GLOVE) ×1 IMPLANT
GLOVE PI ORTHO PRO STRL SZ8 (GLOVE) ×1 IMPLANT
GLOVE SURG ORTHO 8.5 STRL (GLOVE) ×2 IMPLANT
GLOVE SURG SS PI 6.5 STRL IVOR (GLOVE) ×1 IMPLANT
GOWN STRL REUS W/ TWL XL LVL3 (GOWN DISPOSABLE) ×4 IMPLANT
GOWN STRL REUS W/TWL XL LVL3 (GOWN DISPOSABLE) ×8
KIT BASIN OR (CUSTOM PROCEDURE TRAY) ×2 IMPLANT
KIT ROOM TURNOVER OR (KITS) ×2 IMPLANT
MANIFOLD NEPTUNE II (INSTRUMENTS) ×2 IMPLANT
NDL HYPO 25GX1X1/2 BEV (NEEDLE) ×1 IMPLANT
NDL SPNL 18GX3.5 QUINCKE PK (NEEDLE) ×1 IMPLANT
NDL SUT .5 MAYO 1.404X.05X (NEEDLE) ×1 IMPLANT
NDL SUT 6 .5 CRC .975X.05 MAYO (NEEDLE) ×1 IMPLANT
NEEDLE HYPO 25GX1X1/2 BEV (NEEDLE) ×2 IMPLANT
NEEDLE MAYO TAPER (NEEDLE) ×4
NEEDLE SPNL 18GX3.5 QUINCKE PK (NEEDLE) ×2 IMPLANT
NS IRRIG 1000ML POUR BTL (IV SOLUTION) ×2 IMPLANT
PACK SHOULDER (CUSTOM PROCEDURE TRAY) ×2 IMPLANT
PAD ABD 8X10 STRL (GAUZE/BANDAGES/DRESSINGS) ×1 IMPLANT
PAD ARMBOARD 7.5X6 YLW CONV (MISCELLANEOUS) ×4 IMPLANT
RESECTOR FULL RADIUS 4.2MM (BLADE) ×2 IMPLANT
SET ARTHROSCOPY TUBING (MISCELLANEOUS) ×2
SET ARTHROSCOPY TUBING LN (MISCELLANEOUS) ×1 IMPLANT
SLING ARM LRG ADULT FOAM STRAP (SOFTGOODS) ×2 IMPLANT
SLING ARM MED ADULT FOAM STRAP (SOFTGOODS) IMPLANT
SPONGE GAUZE 4X4 12PLY (GAUZE/BANDAGES/DRESSINGS) ×2 IMPLANT
SPONGE LAP 4X18 X RAY DECT (DISPOSABLE) ×1 IMPLANT
STRIP CLOSURE SKIN 1/2X4 (GAUZE/BANDAGES/DRESSINGS) ×3 IMPLANT
SUT FIBERWIRE #2 38 T-5 BLUE (SUTURE)
SUT MNCRL AB 3-0 PS2 18 (SUTURE) ×2 IMPLANT
SUT VIC AB 0 CT2 27 (SUTURE) IMPLANT
SUT VIC AB 2-0 CT1 27 (SUTURE) ×2
SUT VIC AB 2-0 CT1 TAPERPNT 27 (SUTURE) ×1 IMPLANT
SUT VICRYL 0 CT 1 36IN (SUTURE) ×2 IMPLANT
SUTURE FIBERWR #2 38 T-5 BLUE (SUTURE) IMPLANT
SYR CONTROL 10ML LL (SYRINGE) ×2 IMPLANT
TOWEL OR 17X24 6PK STRL BLUE (TOWEL DISPOSABLE) ×2 IMPLANT
TOWEL OR 17X26 10 PK STRL BLUE (TOWEL DISPOSABLE) ×2 IMPLANT
TUBE CONNECTING 12X1/4 (SUCTIONS) ×2 IMPLANT
WAND 90 DEG TURBOVAC W/CORD (SURGICAL WAND) ×2 IMPLANT
WATER STERILE IRR 1000ML POUR (IV SOLUTION) ×2 IMPLANT

## 2013-05-23 NOTE — Anesthesia Preprocedure Evaluation (Signed)
Anesthesia Evaluation  Patient identified by MRN, date of birth, ID band Patient awake    Reviewed: Allergy & Precautions, H&P , NPO status , Patient's Chart, lab work & pertinent test results  Airway Mallampati: II TM Distance: >3 FB Neck ROM: Full    Dental   Pulmonary  breath sounds clear to auscultation        Cardiovascular Rhythm:Regular Rate:Normal     Neuro/Psych  Headaches,    GI/Hepatic   Endo/Other  Hypothyroidism   Renal/GU      Musculoskeletal   Abdominal (+) + obese,   Peds  Hematology   Anesthesia Other Findings   Reproductive/Obstetrics                           Anesthesia Physical Anesthesia Plan  ASA: II  Anesthesia Plan: General   Post-op Pain Management:    Induction: Intravenous  Airway Management Planned: Oral ETT  Additional Equipment:   Intra-op Plan:   Post-operative Plan: Extubation in OR  Informed Consent: I have reviewed the patients History and Physical, chart, labs and discussed the procedure including the risks, benefits and alternatives for the proposed anesthesia with the patient or authorized representative who has indicated his/her understanding and acceptance.     Plan Discussed with: CRNA and Surgeon  Anesthesia Plan Comments:         Anesthesia Quick Evaluation

## 2013-05-23 NOTE — Transfer of Care (Signed)
Immediate Anesthesia Transfer of Care Note  Patient: Karen Merritt  Procedure(s) Performed: Procedure(s): RIGHT SHOULDER ARTHROSCOPY WITH SUBACROMIAL DECOMPRESSION AND MINI OPEN ROTATOR CUFF REPAIR, AND DISTAL CLAVICLE RESECTION (Right)  Patient Location: PACU  Anesthesia Type:General and GA combined with regional for post-op pain  Level of Consciousness: awake, alert , oriented and patient cooperative  Airway & Oxygen Therapy: Patient Spontanous Breathing and Patient connected to nasal cannula oxygen  Post-op Assessment: Report given to PACU RN, Post -op Vital signs reviewed and stable and Patient moving all extremities  Post vital signs: Reviewed and stable  Complications: No apparent anesthesia complications

## 2013-05-23 NOTE — Discharge Instructions (Signed)
Please take the pain medication today after food and well before the block wears off  Ice the shoulder constantly.  No push pull or lift with the right arm.  Ok to remove the sling in the house and hug a pillow.  Start exercises today at home.  Do Pendulum exercises, rotation exercises, while resting the arm against the pillow laying across your hip, rotate the arm toward you like hugging yourself then rotate the hand away and "hitchhike with the thumb".  Also do a pillow glide, sliding the arm away from you and toward you.  This can be done on a counter at home or a table as well.  Move the shoulder at least every hour for five minutes.  Change bandages to BandAids in two days, then ok to get wet in the shower in 5 days, keep covered and dry until then.  Follow up in the clinic with Dr Veverly Fells in two weeks  [phone]

## 2013-05-23 NOTE — Anesthesia Postprocedure Evaluation (Signed)
Anesthesia Post Note  Patient: Karen Merritt  Procedure(s) Performed: Procedure(s) (LRB): RIGHT SHOULDER ARTHROSCOPY WITH SUBACROMIAL DECOMPRESSION AND MINI OPEN ROTATOR CUFF REPAIR, AND DISTAL CLAVICLE RESECTION (Right)  Anesthesia type: general  Patient location: PACU  Post pain: Pain level controlled  Post assessment: Patient's Cardiovascular Status Stable  Last Vitals:  Filed Vitals:   05/23/13 1700  BP: 107/62  Pulse: 58  Temp:   Resp: 13    Post vital signs: Reviewed and stable  Level of consciousness: sedated  Complications: No apparent anesthesia complications

## 2013-05-23 NOTE — Anesthesia Procedure Notes (Addendum)
Anesthesia Regional Block:  Interscalene brachial plexus block  Pre-Anesthetic Checklist: ,, timeout performed, Correct Patient, Correct Site, Correct Laterality, Correct Procedure, Correct Position, site marked, Risks and benefits discussed,  Surgical consent,  Pre-op evaluation,  At surgeon's request and post-op pain management  Laterality: Right  Prep: chloraprep       Needles:  Injection technique: Single-shot  Needle Type: Echogenic Stimulator Needle     Needle Length: 5cm 5 cm Needle Gauge: 22 and 22 G    Additional Needles:  Procedures: ultrasound guided (picture in chart) and nerve stimulator Interscalene brachial plexus block  Nerve Stimulator or Paresthesia:  Response: 0.48 mA,   Additional Responses:   Narrative:  Start time: 05/23/2013 11:55 AM End time: 05/23/2013 12:06 PM Injection made incrementally with aspirations every 5 mL. Anesthesiologist: Dr Chriss Driver  Additional Notes: [id number] R ISB POP CHG prep, sterile tech #22 stim/echo needle with stim down to .90ma and god [place] visualization-PIX in chart Multiple neg asp Marc .5% w/epi 1:200000 total 25cc No compl Dr Chriss Driver   Procedure Name: Intubation Date/Time: 05/23/2013 1:55 PM Performed by: Ebbie Latus E Pre-anesthesia Checklist: Patient identified, Emergency Drugs available, Suction available, Patient being monitored and Timeout performed Patient Re-evaluated:Patient Re-evaluated prior to inductionOxygen Delivery Method: Circle system utilized Preoxygenation: Pre-oxygenation with 100% oxygen Intubation Type: IV induction Ventilation: Mask ventilation without difficulty Laryngoscope Size: Miller and 2 Grade View: Grade I Tube type: Oral Tube size: 7.5 mm Number of attempts: 1 Airway Equipment and Method: Stylet Placement Confirmation: ETT inserted through vocal cords under direct vision,  positive ETCO2 and breath sounds checked- equal and bilateral Secured at: 22 cm Tube secured with: Tape Dental  Injury: Teeth and Oropharynx as per pre-operative assessment

## 2013-05-23 NOTE — Interval H&P Note (Signed)
History and Physical Interval Note:  05/23/2013 12:30 PM  Karen Merritt  has presented today for surgery, with the diagnosis of [place], AS DEGENERATIVE JOINT DISEASE  The various methods of treatment have been discussed with the patient and family. After consideration of risks, benefits and other options for treatment, the patient has consented to  Procedure(s): RIGHT SHOULDER ARTHROSCOPY WITH SUBACROMIAL DECOMPRESSION AND MINI OPEN ROTATOR CUFF REPAIR, AND DISTAL CLAVICLE RESECTION (Right) as a surgical intervention .  The patient's history has been reviewed, patient examined, no change in status, stable for surgery.  I have reviewed the patient's chart and labs.  Questions were answered to the patient's satisfaction.     Augustin Schooling

## 2013-05-23 NOTE — Progress Notes (Signed)
Orthopedic Tech Progress Note Patient Details:  Karen Merritt 1970/03/13 [id number] Ordered and delivered to OR desk Ortho Devices Type of Ortho Device: Shoulder abduction pillow Ortho Device/Splint Interventions: Ordered   [place] 05/23/2013, 3:31 PM

## 2013-05-23 NOTE — Brief Op Note (Signed)
05/23/2013  4:12 PM  PATIENT:  Karen Merritt  43 y.o. female  PRE-OPERATIVE DIAGNOSIS:  RIGHT SHOULDER ROTATOR CUFF TEAR, AC DEGENERATIVE JOINT DISEASE, SLAP LESION  POST-OPERATIVE DIAGNOSIS:  RIGHT SHOULDER ROTATOR CUFF TEAR, AC DEGENERATIVE JOINT DISEASE, SLAP LESION  PROCEDURE:  Procedure(s): RIGHT SHOULDER ARTHROSCOPY WITH SUBACROMIAL DECOMPRESSION AND MINI OPEN ROTATOR CUFF REPAIR, AND DISTAL CLAVICLE RESECTION (Right), SLAP DEBRIDEMENT AND BICEPS TENODESIS  SURGEON:  Surgeon(s) and Role:    * Augustin Schooling, MD - Primary  PHYSICIAN ASSISTANT:   ASSISTANTS: Ventura Bruns, PA-C   ANESTHESIA:   regional and general  EBL:  Total I/O In: 1500 [I.V.:1500] Out: -   BLOOD ADMINISTERED:none  DRAINS: none   LOCAL MEDICATIONS USED:  MARCAINE     SPECIMEN:  No Specimen  DISPOSITION OF SPECIMEN:  N/A  COUNTS:  YES  TOURNIQUET:  * No tourniquets in log *  DICTATION: .Other Dictation: Dictation Number [id number]  PLAN OF CARE: Discharge to home after PACU  PATIENT DISPOSITION:  PACU - hemodynamically stable.   Delay start of Pharmacological VTE agent (>24hrs) due to surgical blood loss or risk of bleeding: not applicable

## 2013-05-24 ENCOUNTER — Encounter (HOSPITAL_COMMUNITY): Payer: Self-pay | Admitting: Orthopedic Surgery

## 2013-05-24 NOTE — Op Note (Signed)
NAMEBREE, HEINZELMAN               ACCOUNT NO.:  [id number]  MEDICAL RECORD NO.:  [id number]  LOCATION:  [facility]                         FACILITY:  [facility]  PHYSICIAN:  Doran Heater. Veverly Fells, M.D. DATE OF BIRTH:  10/27/1970  DATE OF PROCEDURE:  05/23/2013 DATE OF DISCHARGE:  05/23/2013                              OPERATIVE REPORT   PREOPERATIVE DIAGNOSES:  Right shoulder rotator cuff tear, SLAP lesion, acromioclavicular joint arthritis.  POSTOPERATIVE DIAGNOSES:  Right shoulder rotator cuff tear, SLAP lesion, acromioclavicular joint arthritis, partial-thickness subscapularis tear.  PROCEDURE PERFORMED:  Right shoulder examined under anesthesia, shoulder arthroscopy, intra-articular debridement of torn superior labrum anterior-posterior, biceps tenotomy followed by arthroscopic subacromial decompression with mini open rotator cuff repair, biceps tenodesis in the groove, and distal clavicle resection.  ATTENDING SURGEON:  Doran Heater. Veverly Fells, MD  ASSISTANT:  Abbott Pao. Dixon, PA-C, who was scrubbed the entire procedure and necessary for satisfactory completion of surgery.  ANESTHESIA:  General anesthesia was used plus interscalene block.  ESTIMATED BLOOD LOSS:  Minimal.  FLUID REPLACED:  1200 mL crystalloid.  INSTRUMENT COUNTS:  Correct.  COMPLICATIONS:  There were no complications.  ANTIBIOTICS:  Perioperative antibiotics were given.  INDICATIONS:  The patient is a 43 year old female with injury to her right shoulder while shoveling snow.  The patient complained of progressive pain despite conservative management.  She has a symptomatic rotator cuff tear as well as advanced AC arthritis without any impingement and questionable SLAP lesion.  The patient has failed conservative management and desires operative treatment to restore function, to eliminate pain to her shoulder.  Informed consent obtained.  DESCRIPTION OF PROCEDURE:  After adequate level of anesthesia was achieved, the  patient was positioned in a modified beach-chair position. Right shoulder correctly identified.  Time-out was called.  Exam under anesthesia performed.  We found full passive range of motion of the shoulder with no undue stiffness or instability.  A sterile prep and drape of the shoulder and arm.  We entered the shoulder with standard portals including anterior, posterior and lateral portals.  We identified significant synovitis as well as a degenerative superior labral tear.  The biceps anchor was unstable.  We performed a biceps tenotomy and labral debridement back to a stable labral rim.  Anterior- inferior and posterior-inferior labrum intact.  Minimal chondromalacia noted on the glenoid.  Tangential chondroplasty performed just removing the loose flaps and preserving all the articular cartilage thickness. Humeral head cartilage was normal.  Subscapularis had an undersurface partial-thickness tear.  We debrided that using suction shaver and then scuffed up the subscap insertion site on the lesser tuberosity to encourage healing.  Rotator cuff had a what appeared to be near full thickness undersurface tear involving the supraspinatus tendon.  We visualized this from the anterior and posterior portals.  We then placed a scope in subacromial space.  Thorough bursectomy and acromioplasty was performed creating a type 1 acromial shape with a butcher block technique while utilizing high-speed bur creating a type 1 acromial shape.  We released the CA ligament as well.  We had a nice thorough decompression of rotator cuff.  The tear was visualized from the bursal surface as well.  Again, this  appeared to be a near full-thickness. Having completed our arthroscopic subacromial decompression, we concluded the arthroscopic portion of the procedure.  We then made a saber incisional overlying the AC joint.  Dissection down through subcutaneous tissues using needle tip Bovie.  We identified  the deltotrapezial fascia and divided in line with distal clavicle. Subperiosteal dissection of distal clavicle performed followed by excision of distal 2-3 mm of bone using an oscillating saw.  We thoroughly irrigated the AC interval and applied bone wax to cut in the clavicle.  We then repaired deltotrapezial fascia with interrupted 0 Vicryl suture followed by 2-0 Vicryl subcutaneous closure and 4-0 Monocryl for skin.  We dressed the rotator cuff of the biceps through a mini open incision starting at the anterolateral border of the acromion staying distally about 3-4 cm.  Dissection down through subcutaneous tissues, split the deltoid in its raphe between the anterior and lateral heads using the Bovie, placed Arthrex retractor identifying the biceps tendon, delivered that out of the wound, repaired before the biceps screw with a Bovie and the Soil scientist to encourage healing.  We placed a single Y-Knot Flex anchor through the floor of the biceps groove, brought that suture up in good tension with the elbow at 90 degrees through the biceps tendon in a whipstitch fashion, continued these through the tendon with the Hi-Fi suture that was part of the Y- Knot Flex anchor.  We also oversewed with 0 Vicryl suture and had a nice low-profile repair.  We then addressed the rotator cuff tear.  We completed the rotator cuff tear as it was a near-complete partial- thickness tear.  We completed that.  We elevated the anterior-posterior portion of the tendon.  We prepared the rotator cuff footprint with a rongeur and curette, placed a Y-Knot RC anchor at the articular margin, double 0 with #2 Hi-fi suture brought that up to the anterior-posterior portion of the tendon.  We also did some side-to-side with #2 Hi-fi suture.  We had an anatomic repair not under tension.  We ranged her shoulder fully.  We also took some suture down through drill holes laterally.  We really had quite a strong repair of  the small under 1 cm tendon tear.  We also mobilized the tendon as well also nicely back into its anatomic position.  We thoroughly irrigated the subdeltoid interval, repaired the deltoid anatomically with 0 Vicryl suture followed by 2-0 Vicryl subcutaneous closure and 4-0 Monocryl for skin and portals. Steri-Strips applied followed by sterile dressing.  The patient tolerated the surgery well.     Doran Heater. Veverly Fells, M.D.     SRN/MEDQ  D:  05/23/2013  T:  05/24/2013  Job:  [id number]

## 2013-05-25 NOTE — Anesthesia Postprocedure Evaluation (Signed)
  Anesthesia Post-op Note  Patient: Karen Merritt  Procedure(s) Performed: Procedure(s): RIGHT SHOULDER ARTHROSCOPY WITH SUBACROMIAL DECOMPRESSION AND MINI OPEN ROTATOR CUFF REPAIR, AND DISTAL CLAVICLE RESECTION (Right)  Patient Location: PACU  Anesthesia Type:GA combined with regional for post-op pain  Level of Consciousness: awake and alert   Airway and Oxygen Therapy: Patient Spontanous Breathing  Post-op Pain: none  Post-op Assessment: Post-op Vital signs reviewed, Patient's Cardiovascular Status Stable, Respiratory Function Stable, Patent Airway, No signs of Nausea or vomiting and Pain level controlled  Post-op Vital Signs: Reviewed and stable  Last Vitals:  Filed Vitals:   05/23/13 1905  BP:   Pulse: 64  Temp:   Resp:     Complications: No apparent anesthesia complications

## 2013-10-19 ENCOUNTER — Other Ambulatory Visit: Payer: Self-pay | Admitting: Family Medicine

## 2013-10-19 NOTE — Telephone Encounter (Signed)
Med filled.  

## 2013-11-21 ENCOUNTER — Encounter (HOSPITAL_COMMUNITY): Payer: Self-pay | Admitting: Orthopedic Surgery

## 2013-12-28 ENCOUNTER — Other Ambulatory Visit: Payer: Self-pay | Admitting: Family Medicine

## 2013-12-28 NOTE — Telephone Encounter (Signed)
Med filled.  

## 2014-01-20 LAB — HM MAMMOGRAPHY

## 2014-01-20 LAB — HM PAP SMEAR

## 2014-02-16 ENCOUNTER — Encounter (HOSPITAL_COMMUNITY): Payer: Self-pay | Admitting: Emergency Medicine

## 2014-02-16 ENCOUNTER — Emergency Department (HOSPITAL_COMMUNITY)
Admission: EM | Admit: 2014-02-16 | Discharge: 2014-02-16 | Disposition: A | Discharge status: Home / Self Care | Payer: 59 | Attending: Emergency Medicine | Admitting: Emergency Medicine

## 2014-02-16 DIAGNOSIS — E039 Hypothyroidism, unspecified: Secondary | ICD-10-CM | POA: Insufficient documentation

## 2014-02-16 DIAGNOSIS — Z79899 Other long term (current) drug therapy: Secondary | ICD-10-CM | POA: Insufficient documentation

## 2014-02-16 DIAGNOSIS — Z008 Encounter for other general examination: Secondary | ICD-10-CM | POA: Diagnosis not present

## 2014-02-16 DIAGNOSIS — Z8719 Personal history of other diseases of the digestive system: Secondary | ICD-10-CM | POA: Insufficient documentation

## 2014-02-16 DIAGNOSIS — Z0389 Encounter for observation for other suspected diseases and conditions ruled out: Secondary | ICD-10-CM | POA: Diagnosis present

## 2014-02-16 DIAGNOSIS — Z0000 Encounter for general adult medical examination without abnormal findings: Secondary | ICD-10-CM

## 2014-02-16 NOTE — ED Notes (Signed)
Pt states she swallowed a piece of gum earlier and feels like it is lodged somewhere in her esophagus  Pt states she has tried multiple things but none are helping  Pt states she is not having any difficulty breathing or swallowing

## 2014-02-16 NOTE — Discharge Instructions (Signed)
FOLLOW UP WITH DR. BATES IF YOU HAVE NEW SYMPTOMS INDICATING POSSIBLE RETAINED FOREIGN BODY IN THE NASAL OR POSTERIOR THROAT AREA.

## 2014-02-16 NOTE — ED Provider Notes (Signed)
CSN: [id number]     Arrival date & time 02/16/14  2042 History   None    This chart was scribed for non-physician practitioner, Charlann Lange PA-C, working with Blanchie Dessert, MD by Forrestine Him, ED Scribe. This patient was seen in room WTR8/WTR8 and the patient's care was started at 9:27 PM.   Chief Complaint  Patient presents with  . Aspiration   HPI  HPI Comments: Karen Merritt is a 44 y.o. female with a PMHx hypothyroidism who presents to the Emergency Department with concern for possible aspiration this evening.  Pt states she was driving when swallowed a small piece of gum. She states she then felt the gum move up her esophagus and can now feel it in her L sinus. She has tried remedies at home such as warm tea, swallowing bread, and drinking water without any improvement. Pt denies any SOB, cough, fever, trouble speaking, or trouble swallowing. No known allergies to medications.  Past Medical History  Diagnosis Date  . Hypothyroidism   . Chest pain     non-cardiac 2005, 2012.  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 Hemorrhoids 11-21-2009    colonoscopy   Past Surgical History  Procedure Laterality Date  . Thyroidectomy  12/29/96  . Stress cardiolite  05/23/03  . Total abdominal hysterectomy  2008  . Bone spur  6/11    right big toe  . Shoulder arthroscopy with subacromial decompression and open rotator c Right 05/23/2013    Procedure: RIGHT SHOULDER ARTHROSCOPY WITH SUBACROMIAL DECOMPRESSION AND MINI OPEN ROTATOR CUFF REPAIR, AND DISTAL CLAVICLE RESECTION;  Surgeon: Augustin Schooling, MD;  Location: [place] [facility];  Service: Orthopedics;  Laterality: Right;   Family History  Problem Relation Age of Onset  . Bone cancer Maternal Aunt   . Thyroid disease Maternal Aunt   . Liver cancer Maternal Aunt   . Hypertension Mother   . Colon polyps Mother   . Coronary artery disease Mother   . Hypertension Maternal Grandmother   . Diabetes Maternal Grandmother   . Coronary  artery disease Maternal Grandfather   . Coronary artery disease Maternal Uncle   . Hypertension Maternal Uncle   . Heart disease Maternal Aunt    History  Substance Use Topics  . Smoking status: Never Smoker   . Smokeless tobacco: Never Used  . Alcohol Use: No   OB History    Gravida Para Term Preterm AB TAB SAB Ectopic Multiple Living   2 2             Review of Systems  Constitutional: Negative for fever and chills.  HENT: Negative for trouble swallowing.   Respiratory: Negative for shortness of breath.       Allergies  Review of patient's allergies indicates no known allergies.  Home Medications   Prior to Admission medications   Medication Sig Start Date End Date Taking? Authorizing Provider  levothyroxine (SYNTHROID, LEVOTHROID) 100 MCG tablet TAKE 1 TABLET DAILY 12/28/13   Midge Minium, MD  Melatonin 5 MG TABS Take 5 mg by mouth at bedtime as needed (sleep).    Historical Provider, MD  metaxalone (SKELAXIN) 800 MG tablet Take 1 tablet (800 mg total) by mouth 4 (four) times daily as needed for muscle spasms. 05/23/13   Augustin Schooling, MD  oxyCODONE-acetaminophen (ROXICET) 5-325 MG per tablet Take 1-2 tablets by mouth every 4 (four) hours as needed for severe pain. 05/23/13   Augustin Schooling, MD  promethazine (  PHENERGAN) 25 MG tablet Take 1 tablet (25 mg total) by mouth every 8 (eight) hours as needed for nausea. 04/27/12   Rosalita Chessman, DO  zolpidem (AMBIEN) 10 MG tablet Take 10 mg by mouth at bedtime as needed for sleep.    Historical Provider, MD   Triage Vitals: BP 141/79 mmHg  Pulse 98  Temp(Src) 97.8 F (36.6 C) (Oral)  Resp 20  SpO2 100%   Physical Exam  Constitutional: She is oriented to person, place, and time. She appears well-developed and well-nourished.  HENT:  Head: Normocephalic.  Nasal passageway Normal turbinates oropharynx is benign No foreign body  Eyes: EOM are normal.  Neck: Normal range of motion.  Pulmonary/Chest: Effort normal and  breath sounds normal.  Abdominal: She exhibits no distension.  Musculoskeletal: Normal range of motion.  Neurological: She is alert and oriented to person, place, and time.  Psychiatric: She has a normal mood and affect.  Nursing note and vitals reviewed.   ED Course  Procedures (including critical care time)  DIAGNOSTIC STUDIES: Oxygen Saturation is 100% on RA, Normal by my interpretation.    COORDINATION OF CARE: 9:28 PM-Discussed treatment plan with pt at bedside and pt agreed to plan.     Labs Review Labs Reviewed - No data to display  Imaging Review No results found.   EKG Interpretation None      MDM   Final diagnoses:  None    1. Normal physical exam  No foreign body visualized in nasal passage or posterior oropharynx. She is well appearing without pain. No SOB or cough after event to cause concern for aspiration. Discussed follow up with ENT if any new symptoms of retained FB develop.  I personally performed the services described in this documentation, which was scribed in my presence. The recorded information has been reviewed and is accurate.     Dewaine Oats, PA-C 02/16/14 2140  Blanchie Dessert, MD 02/17/14 0005

## 2014-02-16 NOTE — ED Notes (Signed)
Pt reports swallowing gum while driving today.  Pt reports she can feel it and taste it in her throat.  Pt reports trying several things without success.  Pt denies any respiratory complaint at this time.

## 2014-04-06 ENCOUNTER — Telehealth: Payer: Self-pay | Admitting: Family Medicine

## 2014-04-06 NOTE — Telephone Encounter (Signed)
Pre visit CPE letter sent

## 2014-04-17 ENCOUNTER — Encounter: Payer: 59 | Admitting: Family Medicine

## 2014-04-24 ENCOUNTER — Telehealth: Payer: Self-pay | Admitting: *Deleted

## 2014-04-24 ENCOUNTER — Encounter: Payer: Self-pay | Admitting: *Deleted

## 2014-04-24 NOTE — Telephone Encounter (Signed)
Unable to reach patient at time of Pre-Visit Call.  Left message for patient to return call when available.    

## 2014-04-24 NOTE — Addendum Note (Signed)
Addended by: Leticia Penna A on: 04/24/2014 04:43 PM   Modules accepted: Orders, Medications

## 2014-04-24 NOTE — Telephone Encounter (Signed)
Pre-Visit Call completed with patient and chart updated.   Pre-Visit Info documented in Specialty Comments under [employer].    

## 2014-04-25 ENCOUNTER — Encounter: Payer: Self-pay | Admitting: Family Medicine

## 2014-04-25 ENCOUNTER — Ambulatory Visit (INDEPENDENT_AMBULATORY_CARE_PROVIDER_SITE_OTHER): Payer: 59 | Admitting: Family Medicine

## 2014-04-25 VITALS — BP 122/74 | HR 61 | Temp 98.2°F | Resp 16 | Ht 66.25 in | Wt 194.5 lb

## 2014-04-25 DIAGNOSIS — Z0000 Encounter for general adult medical examination without abnormal findings: Secondary | ICD-10-CM

## 2014-04-25 LAB — HEPATIC FUNCTION PANEL
ALBUMIN: 3.9 g/dL (ref 3.5–5.2)
ALT: 18 U/L (ref 0–35)
AST: 18 U/L (ref 0–37)
Alkaline Phosphatase: 57 U/L (ref 39–117)
Bilirubin, Direct: 0.1 mg/dL (ref 0.0–0.3)
Total Bilirubin: 0.6 mg/dL (ref 0.2–1.2)
Total Protein: 6.8 g/dL (ref 6.0–8.3)

## 2014-04-25 LAB — CBC WITH DIFFERENTIAL/PLATELET
Basophils Absolute: 0 10*3/uL (ref 0.0–0.1)
Basophils Relative: 0.5 % (ref 0.0–3.0)
EOS ABS: 0.1 10*3/uL (ref 0.0–0.7)
EOS PCT: 1.8 % (ref 0.0–5.0)
HEMATOCRIT: 40.7 % (ref 36.0–46.0)
Hemoglobin: 13.9 g/dL (ref 12.0–15.0)
LYMPHS ABS: 2.2 10*3/uL (ref 0.7–4.0)
Lymphocytes Relative: 30.8 % (ref 12.0–46.0)
MCHC: 34.2 g/dL (ref 30.0–36.0)
MCV: 83.5 fl (ref 78.0–100.0)
Monocytes Absolute: 0.5 10*3/uL (ref 0.1–1.0)
Monocytes Relative: 6.7 % (ref 3.0–12.0)
Neutro Abs: 4.4 10*3/uL (ref 1.4–7.7)
Neutrophils Relative %: 60.2 % (ref 43.0–77.0)
PLATELETS: 220 10*3/uL (ref 150.0–400.0)
RBC: 4.88 Mil/uL (ref 3.87–5.11)
RDW: 14 % (ref 11.5–15.5)
WBC: 7.3 10*3/uL (ref 4.0–10.5)

## 2014-04-25 LAB — LIPID PANEL
Cholesterol: 187 mg/dL (ref 0–200)
HDL: 33.4 mg/dL — AB (ref >39.00)
LDL CALC: 133 mg/dL — AB (ref 0–99)
NonHDL: 153.6
Total CHOL/HDL Ratio: 6
Triglycerides: 105 mg/dL (ref 0.0–149.0)
VLDL: 21 mg/dL (ref 0.0–40.0)

## 2014-04-25 LAB — BASIC METABOLIC PANEL
BUN: 5 mg/dL — ABNORMAL LOW (ref 6–23)
CALCIUM: 9.2 mg/dL (ref 8.4–10.5)
CO2: 29 meq/L (ref 19–32)
CREATININE: 0.74 mg/dL (ref 0.40–1.20)
Chloride: 105 mEq/L (ref 96–112)
GFR: 89.83 mL/min (ref >60.00)
GLUCOSE: 80 mg/dL (ref 70–99)
Potassium: 3.9 mEq/L (ref 3.5–5.1)
Sodium: 137 mEq/L (ref 135–145)

## 2014-04-25 LAB — TSH: TSH: 5.54 u[IU]/mL — ABNORMAL HIGH (ref 0.35–4.50)

## 2014-04-25 LAB — VITAMIN D 25 HYDROXY (VIT D DEFICIENCY, FRACTURES): VITD: 13.48 ng/mL — AB (ref 30.00–100.00)

## 2014-04-25 MED ORDER — ZOLPIDEM TARTRATE 5 MG PO TABS: 5.0000 mg | ORAL_TABLET | Freq: Every evening | ORAL | Status: DC | PRN

## 2014-04-25 NOTE — Progress Notes (Signed)
   Subjective:    Patient ID: Karen Merritt, female    DOB: 01/10/1971, 44 y.o.   MRN: [id number]  HPI CPE- UTD on GYN Deatra Ina), colonoscopy (2014).   Review of Systems Patient reports no vision/ hearing changes, adenopathy,fever, weight change,  persistant/recurrent hoarseness , swallowing issues, chest pain, edema, persistant/recurrent cough, hemoptysis, dyspnea (rest/exertional/paroxysmal nocturnal), gastrointestinal bleeding (melena, rectal bleeding), abdominal pain, significant heartburn, bowel changes, GU symptoms (dysuria, hematuria, incontinence), Gyn symptoms (abnormal  bleeding, pain),  syncope, focal weakness, memory loss, numbness & tingling, skin/hair/nail changes, abnormal bruising or bleeding, anxiety, or depression.   + palpitations    Objective:   Physical Exam General Appearance:    Alert, cooperative, no distress, appears stated age  Head:    Normocephalic, without obvious abnormality, atraumatic  Eyes:    PERRL, conjunctiva/corneas clear, EOM's intact, fundi    benign, both eyes  Ears:    Normal TM's and external ear canals, both ears  Nose:   Nares normal, septum midline, mucosa normal, no drainage    or sinus tenderness  Throat:   Lips, mucosa, and tongue normal; teeth and gums normal  Neck:   Supple, symmetrical, trachea midline, no adenopathy;    Thyroid: no enlargement/tenderness/nodules  Back:     Symmetric, no curvature, ROM normal, no CVA tenderness  Lungs:     Clear to auscultation bilaterally, respirations unlabored  Chest Wall:    No tenderness or deformity   Heart:    Regular rate and rhythm, S1 and S2 normal, no murmur, rub   or gallop  Breast Exam:    Deferred to GYN  Abdomen:     Soft, non-tender, bowel sounds active all four quadrants,    no masses, no organomegaly  Genitalia:    Deferred to GYN  Rectal:    Extremities:   Extremities normal, atraumatic, no cyanosis or edema  Pulses:   2+ and symmetric all extremities  Skin:   Skin color,  texture, turgor normal, no rashes or lesions  Lymph nodes:   Cervical, supraclavicular, and axillary nodes normal  Neurologic:   CNII-XII intact, normal strength, sensation and reflexes    throughout          Assessment & Plan:

## 2014-04-25 NOTE — Assessment & Plan Note (Signed)
Pt's PE WNL.  UTD on gyn and colonoscopy.  EKG done- see document for interpretation.  Check labs.  Anticipatory guidance provided.

## 2014-04-25 NOTE — Progress Notes (Signed)
Pre visit review using our clinic review tool, if applicable. No additional management support is needed unless otherwise documented below in the visit note. 

## 2014-04-25 NOTE — Patient Instructions (Addendum)
Follow up in 1 year or as needed We'll notify you of your lab results and make any changes if needed Keep up the good work!  You look great! Call with any questions or concerns Happy Early Birthday!!!

## 2014-04-26 ENCOUNTER — Other Ambulatory Visit: Payer: Self-pay | Admitting: General Practice

## 2014-04-26 DIAGNOSIS — R7989 Other specified abnormal findings of blood chemistry: Secondary | ICD-10-CM

## 2014-04-26 MED ORDER — LEVOTHYROXINE SODIUM 112 MCG PO TABS: 112.0000 ug | ORAL_TABLET | Freq: Every day | ORAL | Status: DC

## 2014-04-26 MED ORDER — VITAMIN D (ERGOCALCIFEROL) 1.25 MG (50000 UNIT) PO CAPS: 50000.0000 [IU] | ORAL_CAPSULE | ORAL | Status: DC

## 2014-05-23 ENCOUNTER — Other Ambulatory Visit (INDEPENDENT_AMBULATORY_CARE_PROVIDER_SITE_OTHER): Payer: 59

## 2014-05-23 DIAGNOSIS — R7989 Other specified abnormal findings of blood chemistry: Secondary | ICD-10-CM | POA: Diagnosis not present

## 2014-05-24 LAB — TSH: TSH: 0.36 u[IU]/mL (ref 0.35–4.50)

## 2014-05-29 ENCOUNTER — Telehealth: Payer: Self-pay | Admitting: Family Medicine

## 2014-05-29 NOTE — Telephone Encounter (Signed)
Concerning PSH results. Said we upped her synthroid a little bit. Said level is .36 and she is concerned that it's on the low scale of normal. She is out of medication. She did not have meds today. Last dose taken yesterday. She only had rx for 1 month. She is thinking her dose needs to be higher. Please contact her.  Caller: Elias Bordner Rel to pt: self Ph#: [phone] Pharmacy: Please call medication to [facility] on [address], [place], [place]

## 2014-05-30 ENCOUNTER — Other Ambulatory Visit: Payer: Self-pay | Admitting: General Practice

## 2014-05-30 MED ORDER — LEVOTHYROXINE SODIUM 112 MCG PO TABS: 112.0000 ug | ORAL_TABLET | Freq: Every day | ORAL | Status: DC

## 2014-05-30 NOTE — Telephone Encounter (Signed)
Called and spoke with pt. Advised the TSH scale. Pt was notified that she had a refill at her local pharmacy and [facility]

## 2014-05-30 NOTE — Telephone Encounter (Signed)
Thyroid dose and TSH are an inverse relationship- the higher the TSH, the lower the thyroid function (which means the dose needs to be increased).  Her TSH level of 0.36 actually means her dose is good and may need to be decreased in the future if it drops any lower.  No changes at this time.

## 2014-06-30 ENCOUNTER — Other Ambulatory Visit: Payer: Self-pay | Admitting: Family Medicine

## 2014-06-30 NOTE — Telephone Encounter (Signed)
Pt returning your call [facility] [phone]

## 2014-06-30 NOTE — Telephone Encounter (Signed)
Rx denied-pt no longer needs to be on the medication.//AB/CMA

## 2014-06-30 NOTE — Telephone Encounter (Signed)
Called and [facility] @ 7:56am @ [phone]) asking the pt to RTC regarding med refill request.//AB/CMA

## 2014-07-18 ENCOUNTER — Telehealth: Payer: Self-pay | Admitting: Family Medicine

## 2014-07-18 ENCOUNTER — Ambulatory Visit (INDEPENDENT_AMBULATORY_CARE_PROVIDER_SITE_OTHER): Payer: 59 | Admitting: Medical

## 2014-07-18 ENCOUNTER — Encounter: Payer: Self-pay | Admitting: Medical

## 2014-07-18 VITALS — BP 108/80 | HR 58 | Temp 98.6°F | Ht 66.25 in | Wt 192.8 lb

## 2014-07-18 DIAGNOSIS — E038 Other specified hypothyroidism: Secondary | ICD-10-CM

## 2014-07-18 DIAGNOSIS — R002 Palpitations: Secondary | ICD-10-CM

## 2014-07-18 NOTE — Telephone Encounter (Signed)
Relation to pt: self Call back number: [phone]   Reason for call:   Pt states since MD decrease levothyroxine (SYNTHROID, LEVOTHROID) 112 MCG tablet pt states on two different occasion she has expeerienced rapid heart beat. Pt is concerned about her "levels". Triage call to team health

## 2014-07-18 NOTE — Progress Notes (Signed)
Pre visit review using our clinic review tool, if applicable. No additional management support is needed unless otherwise documented below in the visit note. 

## 2014-07-18 NOTE — Telephone Encounter (Signed)
Patient Name: Karen Merritt DOB: 05-04-1970 Initial Comment Caller states she has increasing heartbeat and fluttering Nurse Assessment Nurse: Ronnald Ramp, RN, Miranda Date/Time (Eastern Time): 07/18/2014 2:37:59 PM Confirm and document reason for call. If symptomatic, describe symptoms. ---Caller states her heart has felt fluttering for the last 10 min. She has had the symptoms before and has been related to Thyroid. Has the patient traveled out of the country within the last 30 days? ---Not Applicable Does the patient require triage? ---Yes Related visit to physician within the last 2 weeks? ---No Does the PT have any chronic conditions? (i.e. diabetes, asthma, etc.) ---Yes List chronic conditions. ---Thyroid Did the patient indicate they were pregnant? ---No Guidelines Guideline Title Affirmed Question Affirmed Notes Heart Rate and Heartbeat Questions History of hyperthyroidism or taking thyroid medication Final Disposition User See PCP When Office is Open (within 3 days) Ronnald Ramp, RN, Miranda Comments Appt scheduled at 4:00pm today with [employer].

## 2014-07-18 NOTE — Patient Instructions (Signed)
Your ekg shows nsr now and on ausculation normal rhythm. But by recent hx I will try to refer to cardioloy quickly for holter. During the interim if you do have prominent palpitation as you described earlier  then be seen at that time  since doing ekg at that time may capture abnormal rhythm.  I am getting thyroid studies to see if those are in line due to the fact if by chance you are over supplmented it is possible this could play role in tachycardia.  Follow up in 7-10 days or as needed

## 2014-07-18 NOTE — Telephone Encounter (Signed)
Patient seen at appointment with Mackie Pai, PA.

## 2014-07-18 NOTE — Progress Notes (Signed)
Subjective:    Patient ID: Karen Merritt, female    DOB: 10/22/1970, 44 y.o.   MRN: [id number]  HPI  Pt in stating she had some palpitations recently. Pt states early this am she felt obvious palpitation. While she was on the phone talking to nurse today. This lasted about 10-15 minutes. Pt states last Thursday she had palpitation event that last about a minute. Pt states palpitation history in the past. She states she usually has had  palpitation about one time about every 4-5 months for years  Since 1998. Pt recent tsh  within normal range but on low side.  Pt today palpitation and in the past pt does not have any chest pain, sob or anxiety.    Pt never had holter monitor in the past.   Pt denies any recent severe stress or anxiety.  Review of Systems  Constitutional: Negative for fever, chills, diaphoresis, activity change and fatigue.  Respiratory: Negative for cough, chest tightness and shortness of breath.   Cardiovascular: Positive for palpitations. Negative for chest pain and leg swelling.  Gastrointestinal: Negative for nausea, vomiting and abdominal pain.  Musculoskeletal: Negative for neck pain and neck stiffness.  Skin: Negative for rash.  Neurological: Negative for dizziness, tremors, seizures, syncope, facial asymmetry, speech difficulty, weakness, light-headedness, numbness and headaches.  Psychiatric/Behavioral: Negative for behavioral problems, confusion and agitation. The patient is not nervous/anxious.      Past Medical History  Diagnosis Date  . Hypothyroidism   . Chest pain     non-cardiac 2005, 2012.  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 Hemorrhoids 11-21-2009    colonoscopy    History   Social History  . Marital Status: Divorced    Spouse Name: N/A  . Number of Children: 2  . Years of Education: N/A   Occupational History  . [employer] History Main Topics  . Smoking status: Never Smoker   .  Smokeless tobacco: Never Used  . Alcohol Use: No  . Drug Use: No  . Sexual Activity: Not on file   Other Topics Concern  . Not on file   Social History Narrative    Past Surgical History  Procedure Laterality Date  . Thyroidectomy  12/29/96  . Stress cardiolite  05/23/03  . Total abdominal hysterectomy  2008  . Bone spur  6/11    right big toe  . Shoulder arthroscopy with subacromial decompression and open rotator c Right 05/23/2013    Procedure: RIGHT SHOULDER ARTHROSCOPY WITH SUBACROMIAL DECOMPRESSION AND MINI OPEN ROTATOR CUFF REPAIR, AND DISTAL CLAVICLE RESECTION;  Surgeon: Augustin Schooling, MD;  Location: [facility];  Service: Orthopedics;  Laterality: Right;    Family History  Problem Relation Age of Onset  . Bone cancer Maternal Aunt   . Thyroid disease Maternal Aunt   . Liver cancer Maternal Aunt   . Hypertension Mother   . Colon polyps Mother   . Coronary artery disease Mother   . Heart disease Mother 16    10/20/13  . Hypertension Maternal Grandmother   . Diabetes Maternal Grandmother   . Coronary artery disease Maternal Grandfather   . Coronary artery disease Maternal Uncle   . Hypertension Maternal Uncle   . Heart disease Maternal Aunt     No Known Allergies  Current Outpatient Prescriptions on File Prior to Visit  Medication Sig Dispense Refill  . levothyroxine (SYNTHROID, LEVOTHROID) 112 MCG tablet Take 1 tablet (112 mcg total)  by mouth daily. 90 tablet 1  . Melatonin 5 MG TABS Take 5 mg by mouth at bedtime as needed (sleep).    . zolpidem (AMBIEN) 5 MG tablet Take 1 tablet (5 mg total) by mouth at bedtime as needed for sleep. 30 tablet 3  . Vitamin D, Ergocalciferol, (DRISDOL) 50000 UNITS CAPS capsule Take 1 capsule (50,000 Units total) by mouth every 7 (seven) days. (Patient not taking: Reported on 07/18/2014) 12 capsule 0   No current facility-administered medications on file prior to visit.    BP 108/80 mmHg  Pulse 58  Temp(Src) 98.6 F (37 C) (Oral)  Ht  5' 6.25" (1.683 m)  Wt 192 lb 12.8 oz (87.454 kg)  BMI 30.88 kg/m2  SpO2 96%       Objective:   Physical Exam  General Mental Status- Alert. General Appearance- Not in acute distress.   Skin General: Color- Normal Color. Moisture- Normal Moisture.  Neck Carotid Arteries- Normal color. Moisture- Normal Moisture. No carotid bruits. No JVD.  Chest and Lung Exam Auscultation: Breath Sounds:-Normal. CTA.  Cardiovascular Auscultation:Rythm- Regular, Rate and Rythm Murmurs & Other Heart Sounds:Auscultation of the heart reveals- No Murmurs.  Abdomen Inspection:-Inspeection Normal. Palpation/Percussion:Note:No mass. Palpation and Percussion of the abdomen reveal- Non Tender, Non Distended + BS, no rebound or guarding.  Anterior thorax- no tenderness to palpation.    Neurologic Cranial Nerve exam:- CN III-XII intact(No nystagmus), symmetric smile. Drift Test:- No drift. Romberg Exam:- Negative.  Heal to Toe Gait exam:-Normal. Finger to Nose:- Normal/Intact Strength:- 5/5 equal and symmetric strength both upper and lower extremities.      Assessment & Plan:

## 2014-07-18 NOTE — Assessment & Plan Note (Signed)
Your ekg shows nsr now and on ausculation normal rhythm. But by recent hx I will try to refer to cardioloy quickly for holter. During the interim if you do have prominent palpitation as you described earlier  then be seen at that time  since doing ekg at that time may capture abnormal rhythm.

## 2014-07-19 LAB — T4, FREE: Free T4: 0.97 ng/dL (ref 0.60–1.60)

## 2014-07-19 LAB — TSH: TSH: 0.33 u[IU]/mL — ABNORMAL LOW (ref 0.35–4.50)

## 2014-07-19 LAB — T3, FREE: T3 FREE: 3 pg/mL (ref 2.3–4.2)

## 2014-07-20 NOTE — Telephone Encounter (Signed)
Future labs 

## 2014-07-27 ENCOUNTER — Telehealth: Payer: Self-pay | Admitting: Medical

## 2014-07-27 ENCOUNTER — Encounter: Payer: Self-pay | Admitting: Family Medicine

## 2014-07-27 DIAGNOSIS — R002 Palpitations: Secondary | ICD-10-CM

## 2014-07-27 NOTE — Telephone Encounter (Signed)
Note to pt. Stay on same dose of her thyroid. Repeat thyroid studies in one week. Future order lab placed. She can get lab. Call and be put on schedule. I placed cardiology order and our staff should notify her.

## 2014-07-27 NOTE — Telephone Encounter (Signed)
CAlled patient left message on answering machine regarding medication(no change). Advised refrerral to Cardiology placed and she will get call regarding appointment time and date from referral dept.

## 2014-07-27 NOTE — Telephone Encounter (Signed)
Palpitation hx. Refer to cardiology.

## 2014-07-27 NOTE — Progress Notes (Signed)
No med changes prior to repeat lab work.  I did not see pt and don't see a cardiology order Karen Merritt saw her for palpitations and mentioned holter)

## 2014-07-27 NOTE — Progress Notes (Signed)
Looked into pt chart. Pt was seen by Percell Miller and based on labs and Encounter pt was informed that a referral to cardiology would be placed. Message forwarded to [facility] due to referral not being placed.

## 2014-07-28 ENCOUNTER — Telehealth: Payer: Self-pay | Admitting: Medical

## 2014-07-28 ENCOUNTER — Other Ambulatory Visit (INDEPENDENT_AMBULATORY_CARE_PROVIDER_SITE_OTHER): Payer: 59

## 2014-07-28 DIAGNOSIS — E038 Other specified hypothyroidism: Secondary | ICD-10-CM

## 2014-07-28 LAB — T3, FREE: T3 FREE: 3.3 pg/mL (ref 2.3–4.2)

## 2014-07-28 LAB — TSH: TSH: 0.3 u[IU]/mL — ABNORMAL LOW (ref 0.35–4.50)

## 2014-07-28 LAB — T4, FREE: FREE T4: 0.85 ng/dL (ref 0.60–1.60)

## 2014-07-28 NOTE — Telephone Encounter (Signed)
Caller name: Theresea Trautmann Relationship to patient: self Can be reached: [phone] Pharmacy:  Reason for call: Pt was in and saw Percell Miller for heart palpatations. He referred her to cardiology for a holter montior. She said they called to schedule and the earliest they can see her for the holter monitor is August 29th with Dr. Dorris Carnes. She is concerned about waiting that long. Is there anything we can do to get her in sooner or to get the monitor sooner? PCP is Dr. Birdie Riddle. Please call pt to advise.

## 2014-07-31 NOTE — Telephone Encounter (Signed)
Per Cardiology patient was offered sooner appointment in [facility] or [place] office, pt refused. That is the earliest appointment in the [place] office.

## 2014-08-01 NOTE — Telephone Encounter (Signed)
Cell # [phone]  Pt called in. She states she was returning a call from 9:51am today. No notes in account. I believe it may have been nurse calling with lab results. Pt is concerned about TSH being "slightly low" and wants to know what that means. Pt states that she has had a total of 4 episodes with her heart (Saturday 07/22/14, Monday 07/24/14, Friday 07/28/14, and today 08/01/14). She would be willing to go to [place] as long as they are in network with insurance if they can offer an earlier appt. Pt states that she is very concerned. Advised pt (per Edward's notes on labs) that if she is having an episode that she can come in for and EKG with the nurse.   Please call pt about labs. Please see if we can get appt earlier than Aug 29 in [place] or [place].

## 2014-08-04 ENCOUNTER — Encounter: Payer: Self-pay | Admitting: Medical

## 2014-08-04 NOTE — Telephone Encounter (Signed)
Relation to HL:KTGY  Call back number: [phone] Pharmacy:  Reason for call:  Patient states she has not received a called regarding below message and patient would like to know the status of cardiologist referral appointment as soon as possible.

## 2014-08-04 NOTE — Telephone Encounter (Signed)
Patient has appt with Dr Harrington Challenger on 09/18/14

## 2014-08-08 ENCOUNTER — Ambulatory Visit: Payer: 59

## 2014-08-08 ENCOUNTER — Encounter: Payer: 59 | Admitting: Medical

## 2014-08-08 ENCOUNTER — Telehealth: Payer: Self-pay | Admitting: Medical

## 2014-08-08 NOTE — Progress Notes (Signed)
Pre visit review using our clinic review tool, if applicable. No additional management support is needed unless otherwise documented below in the visit note. 

## 2014-08-08 NOTE — Telephone Encounter (Signed)
Pt came in today since I advised she could if was having palpitations. Then would get ekg. If abnormal then  could expidite referral with cardiologist/MD. But she was asymptomatic. Her heart RRR on auscultation. Her vs were stable. Pulse 64. bp 118/85. So just talked with referral staff. Got appointment with cardiologist office local with Chiis Berg PA-C for 7/22 at 8:30. Pt notified of appointment.  Pt did not want to have appointment with me. Primarily just wanted cardiology visit expidited.

## 2014-08-11 ENCOUNTER — Ambulatory Visit (INDEPENDENT_AMBULATORY_CARE_PROVIDER_SITE_OTHER): Payer: 59 | Admitting: Nurse Practitioner

## 2014-08-11 ENCOUNTER — Encounter: Payer: Self-pay | Admitting: Nurse Practitioner

## 2014-08-11 VITALS — BP 111/80 | HR 63 | Ht 66.25 in | Wt 194.0 lb

## 2014-08-11 DIAGNOSIS — R002 Palpitations: Secondary | ICD-10-CM

## 2014-08-11 DIAGNOSIS — E890 Postprocedural hypothyroidism: Secondary | ICD-10-CM

## 2014-08-11 DIAGNOSIS — R072 Precordial pain: Secondary | ICD-10-CM

## 2014-08-11 DIAGNOSIS — R0789 Other chest pain: Secondary | ICD-10-CM | POA: Insufficient documentation

## 2014-08-11 LAB — BASIC METABOLIC PANEL
BUN: 8 mg/dL (ref 6–23)
CO2: 28 mEq/L (ref 19–32)
Calcium: 8.8 mg/dL (ref 8.4–10.5)
Chloride: 106 mEq/L (ref 96–112)
Creatinine, Ser: 0.74 mg/dL (ref 0.40–1.20)
GFR: 89.71 mL/min (ref >60.00)
GLUCOSE: 95 mg/dL (ref 70–99)
Potassium: 3.9 mEq/L (ref 3.5–5.1)
SODIUM: 139 meq/L (ref 135–145)

## 2014-08-11 LAB — MAGNESIUM: MAGNESIUM: 2.1 mg/dL (ref 1.5–2.5)

## 2014-08-11 MED ORDER — METOPROLOL TARTRATE 25 MG PO TABS: ORAL_TABLET | ORAL | Status: DC

## 2014-08-11 NOTE — Progress Notes (Signed)
Patient Name: Karen Merritt Date of Encounter: 08/11/2014  Primary Care Provider:  Annye Asa, MD Primary Cardiologist:  New - seen by G. Lovena Le, MD   Chief Complaint  44 year old female without prior cardiac history who presents secondary to palpitations and chest pain.  Past Medical History   Past Medical History  Diagnosis Date  . Hypothyroidism     a. s/p thyroidectomy ~ 1998  . Chest pain     a. non-cardiac 2005, 2012.  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 Hemorrhoids 11-21-2009    colonoscopy  . Palpitations    Past Surgical History  Procedure Laterality Date  . Thyroidectomy  12/29/96  . Stress cardiolite  05/23/03  . Total abdominal hysterectomy  2008  . Bone spur  6/11    right big toe  . Shoulder arthroscopy with subacromial decompression and open rotator c Right 05/23/2013    Procedure: RIGHT SHOULDER ARTHROSCOPY WITH SUBACROMIAL DECOMPRESSION AND MINI OPEN ROTATOR CUFF REPAIR, AND DISTAL CLAVICLE RESECTION;  Surgeon: Augustin Schooling, MD;  Location: [facility];  Service: Orthopedics;  Laterality: Right;    Allergies  No Known Allergies  HPI  44 year old female without a prior history of coronary artery disease. She does have a history of atypical chest pain along with thyroid disease status post thyroidectomy approximately 18 years ago. She has been on levothyroxine since then. Over the past 10 years, she has been experiencing intermittent palpitations, occurring about twice a month, lasting about 10 seconds, and resolving with either cough, deep breathing, or bearing down. She does not typically become presyncopal or have other associated symptoms with palpitations. She describes the episodes as though what one feels when they are scared. Approximately 2 weeks ago, she had sudden onset of tachypalpitations associated with lightheadedness, midsternal chest discomfort, and dyspnea. She tried bearing down and coughing without any significant relief  initially, however symptoms did subside within a minute. A few days later, she had recurrent palpitations more similar to what she has been accustomed to over the years and a day or 2 later, had a fluttering sensation in her chest lasting about 15 minutes. That was the longest episode of palpitations she had ever had. She was seen by her primary care provider that day and her ECG did not show anything acute though she was not having any symptoms at the time of the ECG. Her TSH was drawn and was low at 0.30 though her free T4 and free T3 were within normal limits. Cardiology referral was made due to symptoms. She denies any history of PND, orthopnea, syncope, edema, or early satiety.  Home Medications  Prior to Admission medications   Medication Sig Start Date End Date Taking? Authorizing Provider  levothyroxine (SYNTHROID, LEVOTHROID) 112 MCG tablet Take 1 tablet (112 mcg total) by mouth daily. 05/30/14  Yes Midge Minium, MD  Melatonin 5 MG TABS Take 5 mg by mouth at bedtime as needed (sleep).   Yes Historical Provider, MD  zolpidem (AMBIEN) 5 MG tablet Take 1 tablet (5 mg total) by mouth at bedtime as needed for sleep. 04/25/14  Yes Midge Minium, MD    Family History  Family History  Problem Relation Age of Onset  . Bone cancer Maternal Aunt   . Thyroid disease Maternal Aunt   . Liver cancer Maternal Aunt   . Hypertension Mother   . Colon polyps Mother   . Coronary artery disease Mother     alive @ 66.  Marland Kitchen  Heart disease Mother 66    10/20/13  . Hypertension Maternal Grandmother   . Diabetes Maternal Grandmother   . Coronary artery disease Maternal Grandfather   . Coronary artery disease Maternal Uncle   . Hypertension Maternal Uncle   . Heart disease Maternal Aunt   . Other Father     alive and well @ 71.    Social History  History   Social History  . Marital Status: Divorced    Spouse Name: N/A  . Number of Children: 2  . Years of Education: N/A   Occupational  History  . [employer] History Main Topics  . Smoking status: Never Smoker   . Smokeless tobacco: Never Used  . Alcohol Use: No  . Drug Use: No  . Sexual Activity: Not on file   Other Topics Concern  . Not on file   Social History Narrative   Lives in [place].  Active, walks/jogs.  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     Review of Systems  General:  No chills, fever, night sweats or weight changes.  Cardiovascular:  Positive for palpitations and chest pain in the setting of palpitations.  She also had presyncope during an episode of palpitations. No dyspnea on exertion, edema, orthopnea, paroxysmal nocturnal dyspnea. Dermatological: No rash, lesions/masses Respiratory: No cough, dyspnea Urologic: No hematuria, dysuria Abdominal:   No nausea, vomiting, diarrhea, bright red blood per rectum, melena, or hematemesis Neurologic:  No visual changes, wkns, changes in mental status. All other systems reviewed and are otherwise negative except as noted above.  Physical Exam  VS:  BP 111/80 mmHg  Pulse 63  Ht 5' 6.25" (1.683 m)  Wt 194 lb (87.998 kg)  BMI 31.07 kg/m2 , BMI Body mass index is 31.07 kg/(m^2). GEN: Well nourished, well developed, in no acute distress. HEENT: normal. Neck: Supple, no JVD, carotid bruits, or masses. Cardiac: RRR, no murmurs, rubs, or gallops. No clubbing, cyanosis, edema.  Radials/DP/PT 2+ and equal bilaterally.  Respiratory:  Respirations regular and unlabored, clear to auscultation bilaterally. GI: Soft, nontender, nondistended, BS + x 4. MS: no deformity or atrophy. Skin: warm and dry, no rash. Neuro:  Strength and sensation are intact. Psych: Normal affect.  Accessory Clinical Findings  ECG - sinus rhythm, 63, no acute ST or T changes.  Assessment & Plan  1.  Palpitations: Patient has a long history of palpitations over the past 10 years, typically occurring about twice a month, lasting less than a minute, and  resolving with bearing down, coughing, or deep breathing. Approximately 2 weeks ago, she had a slightly more prolonged episode associated with lightheadedness, dyspnea, and chest pain.  A few days later she had an episode that lasted about 15 mins, which is the longest that it has ever occurred.  I reviewed her case with Dr. Lovena Le, who also saw Ms. Nevin Bloodgood today.  We will arrange for a 2d echo, 30 day event monitor, and will f/u a bmet and Mg today.  I will give her a Rx for lopressor 25 mg 1/2 tab bid prn palipitations (her bp trends low).  We will arrange for a GXT r/t episodic c/p associated with palpitations and f/u with EP in ~ 1 month.  2.  Midsternal Chest pain:  In setting of palpitations.  ECG is nl.  She is otw active w/o limitations.  I will arrange for an ETT to r/o ischemia.  3.  Hypothyroidism:  On synthroid.  TSH  low @ 0.30 but FT4 and FT3 wnl.  Mgmt per IM.  4.  Dispo:  Event monitor, echo, labs, and ETT as above.  F/u with EP in 1 month.  Murray Hodgkins, NP 08/11/2014, 9:06 AM

## 2014-08-11 NOTE — Patient Instructions (Addendum)
Medication Instructions:  START Metoprolol 12.5mg  (1/2 tablet) daily as needed for prolonged palpitations. An Rx has been sent to your pharmacy  Labwork: Bmet, Mag Today  Testing/Procedures: Your physician has requested that you have an exercise tolerance test. For further information please visit [facility]uge[facility]iesta[employer]tn. Please also follow instruction sheet, as given.  Your physician has requested that you have an echocardiogram. Echocardiography is a painless test that uses sound waves to create images of your heart. It provides your doctor with information about the size and shape of your heart and how well your heart's chambers and valves are working. This procedure takes approximately one hour. There are no restrictions for this procedure.  Your physician has recommended that you wear an event monitor. Event monitors are medical devices that record the heart's electrical activity. Doctors most often [place] these monitors to diagnose arrhythmias. Arrhythmias are problems with the speed or rhythm of the heartbeat. The monitor is a small, portable device. You can wear one while you do your normal daily activities. This is usually used to diagnose what is causing palpitations/syncope (passing out).   Follow-Up: Your physician recommends that you schedule a follow-up appointment in: 1 month with EP Dr.Taylor/ or Carlynn Herald, NP   Any Other Special Instructions Will Be Listed Below (If Applicable).

## 2014-08-13 NOTE — Progress Notes (Signed)
This encounter was created in error - please disregard.

## 2014-08-14 ENCOUNTER — Ambulatory Visit (HOSPITAL_COMMUNITY)
Admission: RE | Admit: 2014-08-14 | Discharge: 2014-08-14 | Disposition: A | Discharge status: Home / Self Care | Payer: 59 | Source: Ambulatory Visit | Attending: Nurse Practitioner | Admitting: Nurse Practitioner

## 2014-08-14 DIAGNOSIS — R002 Palpitations: Secondary | ICD-10-CM | POA: Insufficient documentation

## 2014-08-14 DIAGNOSIS — R072 Precordial pain: Secondary | ICD-10-CM

## 2014-08-14 DIAGNOSIS — R0789 Other chest pain: Secondary | ICD-10-CM

## 2014-08-14 DIAGNOSIS — E039 Hypothyroidism, unspecified: Secondary | ICD-10-CM | POA: Insufficient documentation

## 2014-08-15 ENCOUNTER — Other Ambulatory Visit: Payer: Self-pay

## 2014-08-15 ENCOUNTER — Ambulatory Visit (INDEPENDENT_AMBULATORY_CARE_PROVIDER_SITE_OTHER): Payer: 59

## 2014-08-15 ENCOUNTER — Ambulatory Visit (HOSPITAL_BASED_OUTPATIENT_CLINIC_OR_DEPARTMENT_OTHER): Payer: 59

## 2014-08-15 DIAGNOSIS — R002 Palpitations: Secondary | ICD-10-CM | POA: Diagnosis not present

## 2014-08-15 DIAGNOSIS — R072 Precordial pain: Secondary | ICD-10-CM | POA: Diagnosis not present

## 2014-08-16 ENCOUNTER — Telehealth: Payer: Self-pay | Admitting: Nurse Practitioner

## 2014-08-16 NOTE — Telephone Encounter (Signed)
FU  Pt returning phone call 

## 2014-08-16 NOTE — Telephone Encounter (Signed)
Left message for patient to call back  

## 2014-08-16 NOTE — Telephone Encounter (Signed)
Patient called back. Given lab and echo results as written below. Patient verbalized understanding.

## 2014-08-16 NOTE — Telephone Encounter (Signed)
New messsage     Pt returning call about lab results. Please call to discuss

## 2014-08-16 NOTE — Telephone Encounter (Signed)
Left message to call back. Result notes are listed below for echo and labs.  Notes Recorded by Rogelia Mire, NP on 08/15/2014 at 8:46 AM Echo shows normal LV function, normal valves, and normal chamber sizes. Nothing on here to indicate why she might be having palpitations.  Notes Recorded by Rogelia Mire, NP on 08/11/2014 at 5:01 PM Electrolytes wnl - not causing palpitations.

## 2014-08-23 LAB — EXERCISE TOLERANCE TEST
CHL CUP RESTING HR STRESS: 83 {beats}/min
CSEPED: 8 min
CSEPEW: 10.1 METS
Exercise duration (sec): 45 s
MPHR: 174 {beats}/min
Peak HR: 169 {beats}/min
Percent HR: 97 %

## 2014-09-18 ENCOUNTER — Ambulatory Visit: Payer: 59 | Admitting: Internal Medicine

## 2014-09-26 ENCOUNTER — Encounter: Payer: Self-pay | Admitting: Internal Medicine

## 2014-09-26 ENCOUNTER — Ambulatory Visit (INDEPENDENT_AMBULATORY_CARE_PROVIDER_SITE_OTHER): Payer: 59 | Admitting: Internal Medicine

## 2014-09-26 VITALS — BP 110/74 | HR 84 | Ht 66.0 in | Wt 198.0 lb

## 2014-09-26 DIAGNOSIS — R072 Precordial pain: Secondary | ICD-10-CM | POA: Diagnosis not present

## 2014-09-26 DIAGNOSIS — R002 Palpitations: Secondary | ICD-10-CM | POA: Diagnosis not present

## 2014-09-26 DIAGNOSIS — R0789 Other chest pain: Secondary | ICD-10-CM

## 2014-09-26 NOTE — Assessment & Plan Note (Signed)
This is non-anginal pain. She will continue her current meds. Will follow.

## 2014-09-26 NOTE — Progress Notes (Signed)
HPI Karen Merritt is referred today for evaluation of palpitations. She is a pleasant 44 yo woman with a h/o arthritis and shoulder surgery who was found to have chest pain and palpittions. Subsequent workup has been unremarkable. The patient has worn a cardiac monitor for 30 days and had no sustained arrhythmias. She was given a prescription for a beta blocker but did not fill the medication. She has never had syncope and denies associated chest pain or sob.  No Known Allergies   Current Outpatient Prescriptions  Medication Sig Dispense Refill  . levothyroxine (SYNTHROID, LEVOTHROID) 112 MCG tablet Take 1 tablet (112 mcg total) by mouth daily. 90 tablet 1  . Melatonin 5 MG TABS Take 5 mg by mouth at bedtime as needed (sleep).    . metoprolol tartrate (LOPRESSOR) 25 MG tablet Take 12.5mg  (1/2tablet) by mouth daily as needed for prolonged palpitations    . zolpidem (AMBIEN) 5 MG tablet Take 1 tablet (5 mg total) by mouth at bedtime as needed for sleep. 30 tablet 3   No current facility-administered medications for this visit.     Past Medical History  Diagnosis Date  . Hypothyroidism     a. s/p thyroidectomy ~ 1998  . Chest pain     a. non-cardiac 2005, 2012.  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 Hemorrhoids 11-21-2009    colonoscopy  . Palpitations     ROS:   All systems reviewed and negative except as noted in the HPI.   Past Surgical History  Procedure Laterality Date  . Thyroidectomy  12/29/96  . Stress cardiolite  05/23/03  . Total abdominal hysterectomy  2008  . Bone spur  6/11    right big toe  . Shoulder arthroscopy with subacromial decompression and open rotator c Right 05/23/2013    Procedure: RIGHT SHOULDER ARTHROSCOPY WITH SUBACROMIAL DECOMPRESSION AND MINI OPEN ROTATOR CUFF REPAIR, AND DISTAL CLAVICLE RESECTION;  Surgeon: Augustin Schooling, MD;  Location: [facility];  Service: Orthopedics;  Laterality: Right;     Family History  Problem  Relation Age of Onset  . Bone cancer Maternal Aunt   . Thyroid disease Maternal Aunt   . Liver cancer Maternal Aunt   . Hypertension Mother   . Colon polyps Mother   . Coronary artery disease Mother     alive @ 49.  Marland Kitchen Heart disease Mother 56    10/20/13  . Hypertension Maternal Grandmother   . Diabetes Maternal Grandmother   . Coronary artery disease Maternal Grandfather   . Coronary artery disease Maternal Uncle   . Hypertension Maternal Uncle   . Heart disease Maternal Aunt   . Other Father     alive and well @ 50.     Social History   Social History  . Marital Status: Divorced    Spouse Name: N/A  . Number of Children: 2  . Years of Education: N/A   Occupational History  . [place] History Main Topics  . Smoking status: Never Smoker   . Smokeless tobacco: Never Used  . Alcohol Use: No  . Drug Use: No  . Sexual Activity: Not on file   Other Topics Concern  . Not on file   Social History Narrative   Lives in [place].  Active, walks/jogs.  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     BP 110/74 mmHg  Pulse 84  Ht 5\' 6"  (1.676 m)  Wt 198 lb (89.812 kg)  BMI 31.97 kg/m2  Physical Exam:  Well appearing 43 yo woman, NAD HEENT: Unremarkable Neck:  6 cm JVD, no thyromegally Lymphatics:  No adenopathy Back:  No CVA tenderness Lungs:  Clear with no wheezes HEART:  Regular rate rhythm, no murmurs, no rubs, no clicks Abd:  soft, positive bowel sounds, no organomegally, no rebound, no guarding Ext:  2 plus pulses, no edema, no cyanosis, no clubbing Skin:  No rashes no nodules Neuro:  CN II through XII intact, motor grossly intact   Assess/Plan:

## 2014-09-26 NOTE — Patient Instructions (Signed)
Medication Instructions:  Your physician recommends that you continue on your current medications as directed. Please refer to the Current Medication list given to you today.   Labwork: None ordered   Testing/Procedures: None ordered   Follow-Up: Your physician recommends that you schedule a follow-up appointment as needed    Any Other Special Instructions Will Be Listed Below (If Applicable).   

## 2014-09-26 NOTE — Assessment & Plan Note (Signed)
She has documented PAC's and PVC's. We discussed the benign nature of her symptoms. She will undergo watchful waiting. I have encouraged the patient to followup with [place] if she has prolonged symptoms.

## 2014-10-21 LAB — HM MAMMOGRAPHY: HM Mammogram: NORMAL (ref 0–4)

## 2014-11-20 ENCOUNTER — Other Ambulatory Visit: Payer: Self-pay | Admitting: Family Medicine

## 2014-11-20 NOTE — Telephone Encounter (Signed)
Medication filled to pharmacy as requested.   

## 2015-06-14 ENCOUNTER — Telehealth: Payer: Self-pay | Admitting: Family Medicine

## 2015-06-14 NOTE — Telephone Encounter (Signed)
Ok to transfer. 

## 2015-06-14 NOTE — Telephone Encounter (Signed)
Ok with me if ok with Dr.Tabori

## 2015-06-14 NOTE — Telephone Encounter (Signed)
Patient would like to transfer from Dr. Birdie Riddle to Percell Miller. Please advise

## 2015-06-15 NOTE — Telephone Encounter (Signed)
Patient scheduled for 06/21/15 and would like her physical conducted.

## 2015-06-20 ENCOUNTER — Telehealth: Payer: Self-pay | Admitting: *Deleted

## 2015-06-20 ENCOUNTER — Encounter: Payer: Self-pay | Admitting: *Deleted

## 2015-06-20 NOTE — Telephone Encounter (Signed)
Pre-Visit Call completed with patient and chart updated.   Pre-Visit Info documented in Specialty Comments under [employer].    

## 2015-06-21 ENCOUNTER — Telehealth: Payer: Self-pay

## 2015-06-21 ENCOUNTER — Ambulatory Visit (INDEPENDENT_AMBULATORY_CARE_PROVIDER_SITE_OTHER): Payer: 59 | Admitting: Medical

## 2015-06-21 ENCOUNTER — Encounter: Payer: Self-pay | Admitting: Medical

## 2015-06-21 VITALS — BP 112/72 | HR 78 | Temp 98.2°F | Ht 66.0 in | Wt 199.6 lb

## 2015-06-21 DIAGNOSIS — Z1211 Encounter for screening for malignant neoplasm of colon: Secondary | ICD-10-CM | POA: Diagnosis not present

## 2015-06-21 DIAGNOSIS — Z114 Encounter for screening for human immunodeficiency virus [HIV]: Secondary | ICD-10-CM

## 2015-06-21 DIAGNOSIS — N390 Urinary tract infection, site not specified: Secondary | ICD-10-CM

## 2015-06-21 DIAGNOSIS — Z0000 Encounter for general adult medical examination without abnormal findings: Secondary | ICD-10-CM

## 2015-06-21 DIAGNOSIS — Z1239 Encounter for other screening for malignant neoplasm of breast: Secondary | ICD-10-CM | POA: Diagnosis not present

## 2015-06-21 DIAGNOSIS — R82998 Other abnormal findings in urine: Secondary | ICD-10-CM

## 2015-06-21 LAB — CBC WITH DIFFERENTIAL/PLATELET
BASOS PCT: 0.9 % (ref 0.0–3.0)
Basophils Absolute: 0.1 10*3/uL (ref 0.0–0.1)
EOS ABS: 0.3 10*3/uL (ref 0.0–0.7)
EOS PCT: 3.8 % (ref 0.0–5.0)
HCT: 41.1 % (ref 36.0–46.0)
HEMOGLOBIN: 13.9 g/dL (ref 12.0–15.0)
LYMPHS ABS: 2.8 10*3/uL (ref 0.7–4.0)
Lymphocytes Relative: 37.4 % (ref 12.0–46.0)
MCHC: 33.8 g/dL (ref 30.0–36.0)
MCV: 83.1 fl (ref 78.0–100.0)
MONO ABS: 0.5 10*3/uL (ref 0.1–1.0)
Monocytes Relative: 6.5 % (ref 3.0–12.0)
NEUTROS PCT: 51.4 % (ref 43.0–77.0)
Neutro Abs: 3.8 10*3/uL (ref 1.4–7.7)
Platelets: 241 10*3/uL (ref 150.0–400.0)
RBC: 4.95 Mil/uL (ref 3.87–5.11)
RDW: 13.9 % (ref 11.5–15.5)
WBC: 7.4 10*3/uL (ref 4.0–10.5)

## 2015-06-21 LAB — COMPREHENSIVE METABOLIC PANEL
ALBUMIN: 4.2 g/dL (ref 3.5–5.2)
ALK PHOS: 57 U/L (ref 39–117)
ALT: 27 U/L (ref 0–35)
AST: 24 U/L (ref 0–37)
BUN: 8 mg/dL (ref 6–23)
CHLORIDE: 104 meq/L (ref 96–112)
CO2: 31 mEq/L (ref 19–32)
CREATININE: 0.88 mg/dL (ref 0.40–1.20)
Calcium: 9.4 mg/dL (ref 8.4–10.5)
GFR: 73.18 mL/min (ref >60.00)
GLUCOSE: 98 mg/dL (ref 70–99)
POTASSIUM: 4.5 meq/L (ref 3.5–5.1)
SODIUM: 140 meq/L (ref 135–145)
TOTAL PROTEIN: 7.3 g/dL (ref 6.0–8.3)
Total Bilirubin: 0.4 mg/dL (ref 0.2–1.2)

## 2015-06-21 LAB — LIPID PANEL
CHOLESTEROL: 226 mg/dL — AB (ref 0–200)
HDL: 34.9 mg/dL — ABNORMAL LOW (ref >39.00)
LDL Cholesterol: 165 mg/dL — ABNORMAL HIGH (ref 0–99)
NONHDL: 191.45
Total CHOL/HDL Ratio: 6
Triglycerides: 130 mg/dL (ref 0.0–149.0)
VLDL: 26 mg/dL (ref 0.0–40.0)

## 2015-06-21 LAB — POC URINALSYSI DIPSTICK (AUTOMATED)
Bilirubin, UA: NEGATIVE
Blood, UA: NEGATIVE
Glucose, UA: NEGATIVE
Ketones, UA: NEGATIVE
NITRITE UA: NEGATIVE
PH UA: 7.5
PROTEIN UA: NEGATIVE
Spec Grav, UA: 1.02
UROBILINOGEN UA: 0.2

## 2015-06-21 LAB — TSH: TSH: 36.51 u[IU]/mL — AB (ref 0.35–4.50)

## 2015-06-21 MED ORDER — LEVOTHYROXINE SODIUM 125 MCG PO TABS: 125.0000 ug | ORAL_TABLET | Freq: Every day | ORAL | Status: DC

## 2015-06-21 NOTE — Progress Notes (Signed)
Subjective:    Patient ID: Karen Merritt, female    DOB: 1970/02/08, 45 y.o.   MRN: [id number]  HPI  I have reviewed pt PMH, PSH, FH, Social History and Surgical History  Pt works at [employer], [place] has not been exercising recently. She used to jog. But none since November, Coffee 1-2 cups a day.   Pt states last pap in 2016. Pt had hysterectomy in past about 10 years ago. Every pap done every year has been normal.  Pt had mammogram at Dr. Deatra Ina. Pt states normal but not clear in our records when done and don't see report.  Pt is fasting.  No acute complaints today.  Pt can get HIV screening today.  Pt states negative colonoscopy at 45 years old.    Review of Systems  Constitutional: Negative for fever, chills and fatigue.  HENT: Negative for congestion, ear pain and sinus pressure.   Respiratory: Negative for cough, chest tightness, shortness of breath and wheezing.   Cardiovascular: Negative for chest pain and palpitations.  Gastrointestinal: Negative for nausea, vomiting, abdominal pain and diarrhea.  Genitourinary: Negative for dysuria, urgency, flank pain and difficulty urinating.  Musculoskeletal: Negative for back pain and neck pain.  Skin: Negative for rash.  Neurological: Negative for dizziness and headaches.  Hematological: Negative for adenopathy. Does not bruise/bleed easily.  Psychiatric/Behavioral: Negative for behavioral problems and confusion.     Past Medical History  Diagnosis Date  . Hypothyroidism     a. s/p thyroidectomy ~ 1998  . Chest pain     a. non-cardiac 2005, 2012.  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 Hemorrhoids 11-21-2009    colonoscopy  . Palpitations      Social History   Social History  . Marital Status: Divorced    Spouse Name: N/A  . Number of Children: 2  . Years of Education: N/A   Occupational History  . [place] History Main Topics  . Smoking status: Never Smoker     . Smokeless tobacco: Never Used  . Alcohol Use: No  . Drug Use: No  . Sexual Activity: Not on file   Other Topics Concern  . Not on file   Social History Narrative   Lives in [place].  Active, walks/jogs.  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    Past Surgical History  Procedure Laterality Date  . Thyroidectomy  12/29/96  . Stress cardiolite  05/23/03  . Total abdominal hysterectomy  2008  . Bone spur  6/11    right big toe  . Shoulder arthroscopy with subacromial decompression and open rotator c Right 05/23/2013    Procedure: RIGHT SHOULDER ARTHROSCOPY WITH SUBACROMIAL DECOMPRESSION AND MINI OPEN ROTATOR CUFF REPAIR, AND DISTAL CLAVICLE RESECTION;  Surgeon: Augustin Schooling, MD;  Location: [facility];  Service: Orthopedics;  Laterality: Right;    Family History  Problem Relation Age of Onset  . Bone cancer Maternal Aunt   . Thyroid disease Maternal Aunt   . Liver cancer Maternal Aunt   . Hypertension Mother   . Colon polyps Mother   . Coronary artery disease Mother     alive @ 75.  Marland Kitchen Heart disease Mother 70    10/20/13  . Hypertension Maternal Grandmother   . Diabetes Maternal Grandmother   . Coronary artery disease Maternal Grandfather   . Coronary artery disease Maternal Uncle   . Hypertension Maternal Uncle   . Heart disease Maternal  Aunt   . Other Father     alive and well @ 51.    No Known Allergies  Current Outpatient Prescriptions on File Prior to Visit  Medication Sig Dispense Refill  . levothyroxine (SYNTHROID, LEVOTHROID) 112 MCG tablet TAKE 1 TABLET DAILY 90 tablet 1  . Melatonin 5 MG TABS Take 5 mg by mouth at bedtime as needed (sleep).     No current facility-administered medications on file prior to visit.    BP 112/72 mmHg  Pulse 78  Temp(Src) 98.2 F (36.8 C) (Oral)  Ht 5\' 6"  (1.676 m)  Wt 199 lb 9.6 oz (90.538 kg)  BMI 32.23 kg/m2  SpO2 98%       Objective:   Physical Exam  General Mental Status- Alert. General Appearance- Not in acute  distress.   Skin General: Color- Normal Color. Moisture- Normal Moisture.  Neck Carotid Arteries- Normal color. Moisture- Normal Moisture. No carotid bruits. No JVD.  Chest and Lung Exam Auscultation: Breath Sounds:-Normal.  Cardiovascular Auscultation:Rythm- Regular. Murmurs & Other Heart Sounds:Auscultation of the heart reveals- No Murmurs.  Abdomen Inspection:-Inspeection Normal. Palpation/Percussion:Note:No mass. Palpation and Percussion of the abdomen reveal- Non Tender, Non Distended + BS, no rebound or guarding.    Neurologic Cranial Nerve exam:- CN III-XII intact(No nystagmus), symmetric smile. Strength:- 5/5 equal and symmetric strength both upper and lower extremities.       Assessment & Plan:  Wellness examination Will get cbc, cmp, tsh, lipid, ua, and hiv screening.   Recommend start regular exercise again. Eat healthy.  We will follow labs and notify you of the results.  Follow up in 1 year for physical but before then for any acute problems.  Sloka Volante, Percell Miller, PA-C

## 2015-06-21 NOTE — Telephone Encounter (Signed)
Pt need to get mammogram done. Dr. Deatra Ina office ordered last. Will you seen when last one was done. Or inform radiology.

## 2015-06-21 NOTE — Progress Notes (Signed)
Pre visit review using our clinic review tool, if applicable. No additional management support is needed unless otherwise documented below in the visit note. 

## 2015-06-21 NOTE — Addendum Note (Signed)
Addended by: Tasia Catchings on: 06/21/2015 10:02 AM   Modules accepted: Orders

## 2015-06-21 NOTE — Assessment & Plan Note (Signed)
Will get cbc, cmp, tsh, lipid, ua, and hiv screening.

## 2015-06-21 NOTE — Patient Instructions (Addendum)
Wellness examination Will get cbc, cmp, tsh, lipid, ua, and hiv screening.   Recommend start regular exercise again. Eat healthy.  We will follow labs and notify you of the results.  Follow up in 1 year for physical but before then for any acute problems.   Recommend start regular exercise again. Eat healthy.  We will follow labs and notify you of the results.    Preventive Care for Adults, Female A healthy lifestyle and preventive care can promote health and wellness. Preventive health guidelines for women include the following key practices.  A routine yearly physical is a good way to check with your health care provider about your health and preventive screening. It is a chance to share any concerns and updates on your health and to receive a thorough exam.  Visit your dentist for a routine exam and preventive care every 6 months. Brush your teeth twice a day and floss once a day. Good oral hygiene prevents tooth decay and gum disease.  The frequency of eye exams is based on your age, health, family medical history, use of contact lenses, and other factors. Follow your health care provider's recommendations for frequency of eye exams.  Eat a healthy diet. Foods like vegetables, fruits, whole grains, low-fat dairy products, and lean protein foods contain the nutrients you need without too many calories. Decrease your intake of foods high in solid fats, added sugars, and salt. Eat the right amount of calories for you.Get information about a proper diet from your health care provider, if necessary.  Regular physical exercise is one of the most important things you can do for your health. Most adults should get at least 150 minutes of moderate-intensity exercise (any activity that increases your heart rate and causes you to sweat) each week. In addition, most adults need muscle-strengthening exercises on 2 or more days a week.  Maintain a healthy weight. The body mass index (BMI) is a  screening tool to identify possible weight problems. It provides an estimate of body fat based on height and weight. Your health care provider can find your BMI and can help you achieve or maintain a healthy weight.For adults 20 years and older:  A BMI below 18.5 is considered underweight.  A BMI of 18.5 to 24.9 is normal.  A BMI of 25 to 29.9 is considered overweight.  A BMI of 30 and above is considered obese.  Maintain normal blood lipids and cholesterol levels by exercising and minimizing your intake of saturated fat. Eat a balanced diet with plenty of fruit and vegetables. Blood tests for lipids and cholesterol should begin at age 87 and be repeated every 5 years. If your lipid or cholesterol levels are high, you are over 50, or you are at high risk for heart disease, you may need your cholesterol levels checked more frequently.Ongoing high lipid and cholesterol levels should be treated with medicines if diet and exercise are not working.  If you smoke, find out from your health care provider how to quit. If you do not use tobacco, do not start.  Lung cancer screening is recommended for adults aged 26-80 years who are at high risk for developing lung cancer because of a history of smoking. A yearly low-dose CT scan of the lungs is recommended for people who have at least a 30-pack-year history of smoking and are a current smoker or have quit within the past 15 years. A pack year of smoking is smoking an average of 1 pack of cigarettes  a day for 1 year (for example: 1 pack a day for 30 years or 2 packs a day for 15 years). Yearly screening should continue until the smoker has stopped smoking for at least 15 years. Yearly screening should be stopped for people who develop a health problem that would prevent them from having lung cancer treatment.  If you are pregnant, do not drink alcohol. If you are breastfeeding, be very cautious about drinking alcohol. If you are not pregnant and choose to  drink alcohol, do not have more than 1 drink per day. One drink is considered to be 12 ounces (355 mL) of beer, 5 ounces (148 mL) of wine, or 1.5 ounces (44 mL) of liquor.  Avoid use of street drugs. Do not share needles with anyone. Ask for help if you need support or instructions about stopping the use of drugs.  High blood pressure causes heart disease and increases the risk of stroke. Your blood pressure should be checked at least every 1 to 2 years. Ongoing high blood pressure should be treated with medicines if weight loss and exercise do not work.  If you are 42-48 years old, ask your health care provider if you should take aspirin to prevent strokes.  Diabetes screening is done by taking a blood sample to check your blood glucose level after you have not eaten for a certain period of time (fasting). If you are not overweight and you do not have risk factors for diabetes, you should be screened once every 3 years starting at age 57. If you are overweight or obese and you are 27-72 years of age, you should be screened for diabetes every year as part of your cardiovascular risk assessment.  Breast cancer screening is essential preventive care for women. You should practice "breast self-awareness." This means understanding the normal appearance and feel of your breasts and may include breast self-examination. Any changes detected, no matter how small, should be reported to a health care provider. Women in their 68s and 30s should have a clinical breast exam (CBE) by a health care provider as part of a regular health exam every 1 to 3 years. After age 32, women should have a CBE every year. Starting at age 23, women should consider having a mammogram (breast X-ray test) every year. Women who have a family history of breast cancer should talk to their health care provider about genetic screening. Women at a high risk of breast cancer should talk to their health care providers about having an MRI and a  mammogram every year.  Breast cancer gene (BRCA)-related cancer risk assessment is recommended for women who have family members with BRCA-related cancers. BRCA-related cancers include breast, ovarian, tubal, and peritoneal cancers. Having family members with these cancers may be associated with an increased risk for harmful changes (mutations) in the breast cancer genes BRCA1 and BRCA2. Results of the assessment will determine the need for genetic counseling and BRCA1 and BRCA2 testing.  Your health care provider may recommend that you be screened regularly for cancer of the pelvic organs (ovaries, uterus, and vagina). This screening involves a pelvic examination, including checking for microscopic changes to the surface of your cervix (Pap test). You may be encouraged to have this screening done every 3 years, beginning at age 59.  For women ages 67-65, health care providers may recommend pelvic exams and Pap testing every 3 years, or they may recommend the Pap and pelvic exam, combined with testing for human papilloma virus (HPV),  every 5 years. Some types of HPV increase your risk of cervical cancer. Testing for HPV may also be done on women of any age with unclear Pap test results.  Other health care providers may not recommend any screening for nonpregnant women who are considered low risk for pelvic cancer and who do not have symptoms. Ask your health care provider if a screening pelvic exam is right for you.  If you have had past treatment for cervical cancer or a condition that could lead to cancer, you need Pap tests and screening for cancer for at least 20 years after your treatment. If Pap tests have been discontinued, your risk factors (such as having a new sexual partner) need to be reassessed to determine if screening should resume. Some women have medical problems that increase the chance of getting cervical cancer. In these cases, your health care provider may recommend more frequent  screening and Pap tests.  Colorectal cancer can be detected and often prevented. Most routine colorectal cancer screening begins at the age of 6 years and continues through age 24 years. However, your health care provider may recommend screening at an earlier age if you have risk factors for colon cancer. On a yearly basis, your health care provider may provide home test kits to check for hidden blood in the stool. Use of a small camera at the end of a tube, to directly examine the colon (sigmoidoscopy or colonoscopy), can detect the earliest forms of colorectal cancer. Talk to your health care provider about this at age 63, when routine screening begins. Direct exam of the colon should be repeated every 5-10 years through age 38 years, unless early forms of precancerous polyps or small growths are found.  People who are at an increased risk for hepatitis B should be screened for this virus. You are considered at high risk for hepatitis B if:  You were born in a country where hepatitis B occurs often. Talk with your health care provider about which countries are considered high risk.  Your parents were born in a high-risk country and you have not received a shot to protect against hepatitis B (hepatitis B vaccine).  You have HIV or AIDS.  You use needles to inject street drugs.  You live with, or have sex with, someone who has hepatitis B.  You get hemodialysis treatment.  You take certain medicines for conditions like cancer, organ transplantation, and autoimmune conditions.  Hepatitis C blood testing is recommended for all people born from [birth year] through 1965 and any individual with known risks for hepatitis C.  Practice safe sex. Use condoms and avoid high-risk sexual practices to reduce the spread of sexually transmitted infections (STIs). STIs include gonorrhea, chlamydia, syphilis, trichomonas, herpes, HPV, and human immunodeficiency virus (HIV). Herpes, HIV, and HPV are viral illnesses  that have no cure. They can result in disability, cancer, and death.  You should be screened for sexually transmitted illnesses (STIs) including gonorrhea and chlamydia if:  You are sexually active and are younger than 24 years.  You are older than 24 years and your health care provider tells you that you are at risk for this type of infection.  Your sexual activity has changed since you were last screened and you are at an increased risk for chlamydia or gonorrhea. Ask your health care provider if you are at risk.  If you are at risk of being infected with HIV, it is recommended that you take a prescription medicine daily to prevent  HIV infection. This is called preexposure prophylaxis (PrEP). You are considered at risk if:  You are sexually active and do not regularly use condoms or know the HIV status of your partner(s).  You take drugs by injection.  You are sexually active with a partner who has HIV.  Talk with your health care provider about whether you are at high risk of being infected with HIV. If you choose to begin PrEP, you should first be tested for HIV. You should then be tested every 3 months for as long as you are taking PrEP.  Osteoporosis is a disease in which the bones lose minerals and strength with aging. This can result in serious bone fractures or breaks. The risk of osteoporosis can be identified using a bone density scan. Women ages 38 years and over and women at risk for fractures or osteoporosis should discuss screening with their health care providers. Ask your health care provider whether you should take a calcium supplement or vitamin D to reduce the rate of osteoporosis.  Menopause can be associated with physical symptoms and risks. Hormone replacement therapy is available to decrease symptoms and risks. You should talk to your health care provider about whether hormone replacement therapy is right for you.  Use sunscreen. Apply sunscreen liberally and  repeatedly throughout the day. You should seek shade when your shadow is shorter than you. Protect yourself by wearing long sleeves, pants, a wide-brimmed hat, and sunglasses year round, whenever you are outdoors.  Once a month, do a whole body skin exam, using a mirror to look at the skin on your back. Tell your health care provider of new moles, moles that have irregular borders, moles that are larger than a pencil eraser, or moles that have changed in shape or color.  Stay current with required vaccines (immunizations).  Influenza vaccine. All adults should be immunized every year.  Tetanus, diphtheria, and acellular pertussis (Td, Tdap) vaccine. Pregnant women should receive 1 dose of Tdap vaccine during each pregnancy. The dose should be obtained regardless of the length of time since the last dose. Immunization is preferred during the 27th-36th week of gestation. An adult who has not previously received Tdap or who does not know her vaccine status should receive 1 dose of Tdap. This initial dose should be followed by tetanus and diphtheria toxoids (Td) booster doses every 10 years. Adults with an unknown or incomplete history of completing a 3-dose immunization series with Td-containing vaccines should begin or complete a primary immunization series including a Tdap dose. Adults should receive a Td booster every 10 years.  Varicella vaccine. An adult without evidence of immunity to varicella should receive 2 doses or a second dose if she has previously received 1 dose. Pregnant females who do not have evidence of immunity should receive the first dose after pregnancy. This first dose should be obtained before leaving the health care facility. The second dose should be obtained 4-8 weeks after the first dose.  Human papillomavirus (HPV) vaccine. Females aged 13-26 years who have not received the vaccine previously should obtain the 3-dose series. The vaccine is not recommended for use in pregnant  females. However, pregnancy testing is not needed before receiving a dose. If a female is found to be pregnant after receiving a dose, no treatment is needed. In that case, the remaining doses should be delayed until after the pregnancy. Immunization is recommended for any person with an immunocompromised condition through the age of 15 years if she did  not get any or all doses earlier. During the 3-dose series, the second dose should be obtained 4-8 weeks after the first dose. The third dose should be obtained 24 weeks after the first dose and 16 weeks after the second dose.  Zoster vaccine. One dose is recommended for adults aged 87 years or older unless certain conditions are present.  Measles, mumps, and rubella (MMR) vaccine. Adults born before [birth year] generally are considered immune to measles and mumps. Adults born in [birth year] or later should have 1 or more doses of MMR vaccine unless there is a contraindication to the vaccine or there is laboratory evidence of immunity to each of the three diseases. A routine second dose of MMR vaccine should be obtained at least 28 days after the first dose for students attending postsecondary schools, health care workers, or international travelers. People who received inactivated measles vaccine or an unknown type of measles vaccine during 1963-1967 should receive 2 doses of MMR vaccine. People who received inactivated mumps vaccine or an unknown type of mumps vaccine before 1979 and are at high risk for mumps infection should consider immunization with 2 doses of MMR vaccine. For females of childbearing age, rubella immunity should be determined. If there is no evidence of immunity, females who are not pregnant should be vaccinated. If there is no evidence of immunity, females who are pregnant should delay immunization until after pregnancy. Unvaccinated health care workers born before [birth year] who lack laboratory evidence of measles, mumps, or rubella immunity or laboratory  confirmation of disease should consider measles and mumps immunization with 2 doses of MMR vaccine or rubella immunization with 1 dose of MMR vaccine.  Pneumococcal 13-valent conjugate (PCV13) vaccine. When indicated, a person who is uncertain of his immunization history and has no record of immunization should receive the PCV13 vaccine. All adults 52 years of age and older should receive this vaccine. An adult aged 23 years or older who has certain medical conditions and has not been previously immunized should receive 1 dose of PCV13 vaccine. This PCV13 should be followed with a dose of pneumococcal polysaccharide (PPSV23) vaccine. Adults who are at high risk for pneumococcal disease should obtain the PPSV23 vaccine at least 8 weeks after the dose of PCV13 vaccine. Adults older than 47 years of age who have normal immune system function should obtain the PPSV23 vaccine dose at least 1 year after the dose of PCV13 vaccine.  Pneumococcal polysaccharide (PPSV23) vaccine. When PCV13 is also indicated, PCV13 should be obtained first. All adults aged 55 years and older should be immunized. An adult younger than age 54 years who has certain medical conditions should be immunized. Any person who resides in a nursing home or long-term care facility should be immunized. An adult smoker should be immunized. People with an immunocompromised condition and certain other conditions should receive both PCV13 and PPSV23 vaccines. People with human immunodeficiency virus (HIV) infection should be immunized as soon as possible after diagnosis. Immunization during chemotherapy or radiation therapy should be avoided. Routine use of PPSV23 vaccine is not recommended for American Indians, [place] Natives, or people younger than 65 years unless there are medical conditions that require PPSV23 vaccine. When indicated, people who have unknown immunization and have no record of immunization should receive PPSV23 vaccine. One-time  revaccination 5 years after the first dose of PPSV23 is recommended for people aged 19-64 years who have chronic kidney failure, nephrotic syndrome, asplenia, or immunocompromised conditions. People who received 1-2 doses of PPSV23 before  age 37 years should receive another dose of PPSV23 vaccine at age 55 years or later if at least 5 years have passed since the previous dose. Doses of PPSV23 are not needed for people immunized with PPSV23 at or after age 61 years.  Meningococcal vaccine. Adults with asplenia or persistent complement component deficiencies should receive 2 doses of quadrivalent meningococcal conjugate (MenACWY-D) vaccine. The doses should be obtained at least 2 months apart. Microbiologists working with certain meningococcal bacteria, [employer] recruits, people at risk during an outbreak, and people who travel to or live in countries with a high rate of meningitis should be immunized. A first-year college student up through age 40 years who is living in a residence hall should receive a dose if she did not receive a dose on or after her 16th birthday. Adults who have certain high-risk conditions should receive one or more doses of vaccine.  Hepatitis A vaccine. Adults who wish to be protected from this disease, have certain high-risk conditions, work with hepatitis A-infected animals, work in hepatitis A research labs, or travel to or work in countries with a high rate of hepatitis A should be immunized. Adults who were previously unvaccinated and who anticipate close contact with an international adoptee during the first 60 days after arrival in the [place] from a country with a high rate of hepatitis A should be immunized.  Hepatitis B vaccine. Adults who wish to be protected from this disease, have certain high-risk conditions, may be exposed to blood or other infectious body fluids, are household contacts or sex partners of hepatitis B positive people, are clients or workers in  certain care facilities, or travel to or work in countries with a high rate of hepatitis B should be immunized.  Haemophilus influenzae type b (Hib) vaccine. A previously unvaccinated person with asplenia or sickle cell disease or having a scheduled splenectomy should receive 1 dose of Hib vaccine. Regardless of previous immunization, a recipient of a hematopoietic stem cell transplant should receive a 3-dose series 6-12 months after her successful transplant. Hib vaccine is not recommended for adults with HIV infection. Preventive Services / Frequency Ages 2 to 69 years  Blood pressure check.** / Every 3-5 years.  Lipid and cholesterol check.** / Every 5 years beginning at age 73.  Clinical breast exam.** / Every 3 years for women in their 68s and 23s.  BRCA-related cancer risk assessment.** / For women who have family members with a BRCA-related cancer (breast, ovarian, tubal, or peritoneal cancers).  Pap test.** / Every 2 years from ages 38 through 62. Every 3 years starting at age 26 through age 43 or 58 with a history of 3 consecutive normal Pap tests.  HPV screening.** / Every 3 years from ages 13 through ages 75 to 72 with a history of 3 consecutive normal Pap tests.  Hepatitis C blood test.** / For any individual with known risks for hepatitis C.  Skin self-exam. / Monthly.  Influenza vaccine. / Every year.  Tetanus, diphtheria, and acellular pertussis (Tdap, Td) vaccine.** / Consult your health care provider. Pregnant women should receive 1 dose of Tdap vaccine during each pregnancy. 1 dose of Td every 10 years.  Varicella vaccine.** / Consult your health care provider. Pregnant females who do not have evidence of immunity should receive the first dose after pregnancy.  HPV vaccine. / 3 doses over 6 months, if 63 and younger. The vaccine is not recommended for use in pregnant females. However, pregnancy testing is not  needed before receiving a dose.  Measles, mumps, rubella  (MMR) vaccine.** / You need at least 1 dose of MMR if you were born in 1957 or later. You may also need a 2nd dose. For females of childbearing age, rubella immunity should be determined. If there is no evidence of immunity, females who are not pregnant should be vaccinated. If there is no evidence of immunity, females who are pregnant should delay immunization until after pregnancy.  Pneumococcal 13-valent conjugate (PCV13) vaccine.** / Consult your health care provider.  Pneumococcal polysaccharide (PPSV23) vaccine.** / 1 to 2 doses if you smoke cigarettes or if you have certain conditions.  Meningococcal vaccine.** / 1 dose if you are age 27 to 49 years and a [employer] researcher living in a residence hall, or have one of several medical conditions, you need to get vaccinated against meningococcal disease. You may also need additional booster doses.  Hepatitis A vaccine.** / Consult your health care provider.  Hepatitis B vaccine.** / Consult your health care provider.  Haemophilus influenzae type b (Hib) vaccine.** / Consult your health care provider. Ages 11 to 76 years  Blood pressure check.** / Every year.  Lipid and cholesterol check.** / Every 5 years beginning at age 53 years.  Lung cancer screening. / Every year if you are aged 26-80 years and have a 30-pack-year history of smoking and currently smoke or have quit within the past 15 years. Yearly screening is stopped once you have quit smoking for at least 15 years or develop a health problem that would prevent you from having lung cancer treatment.  Clinical breast exam.** / Every year after age 47 years.  BRCA-related cancer risk assessment.** / For women who have family members with a BRCA-related cancer (breast, ovarian, tubal, or peritoneal cancers).  Mammogram.** / Every year beginning at age 23 years and continuing for as long as you are in good health. Consult with your health care provider.  Pap test.** / Every 3  years starting at age 40 years through age 13 or 63 years with a history of 3 consecutive normal Pap tests.  HPV screening.** / Every 3 years from ages 65 years through ages 78 to 64 years with a history of 3 consecutive normal Pap tests.  Fecal occult blood test (FOBT) of stool. / Every year beginning at age 55 years and continuing until age 81 years. You may not need to do this test if you get a colonoscopy every 10 years.  Flexible sigmoidoscopy or colonoscopy.** / Every 5 years for a flexible sigmoidoscopy or every 10 years for a colonoscopy beginning at age 50 years and continuing until age 40 years.  Hepatitis C blood test.** / For all people born from [birth year] through 1967 and any individual with known risks for hepatitis C.  Skin self-exam. / Monthly.  Influenza vaccine. / Every year.  Tetanus, diphtheria, and acellular pertussis (Tdap/Td) vaccine.** / Consult your health care provider. Pregnant women should receive 1 dose of Tdap vaccine during each pregnancy. 1 dose of Td every 10 years.  Varicella vaccine.** / Consult your health care provider. Pregnant females who do not have evidence of immunity should receive the first dose after pregnancy.  Zoster vaccine.** / 1 dose for adults aged 76 years or older.  Measles, mumps, rubella (MMR) vaccine.** / You need at least 1 dose of MMR if you were born in 1957 or later. You may also need a second dose. For females of childbearing age, rubella immunity should  be determined. If there is no evidence of immunity, females who are not pregnant should be vaccinated. If there is no evidence of immunity, females who are pregnant should delay immunization until after pregnancy.  Pneumococcal 13-valent conjugate (PCV13) vaccine.** / Consult your health care provider.  Pneumococcal polysaccharide (PPSV23) vaccine.** / 1 to 2 doses if you smoke cigarettes or if you have certain conditions.  Meningococcal vaccine.** / Consult your health care  provider.  Hepatitis A vaccine.** / Consult your health care provider.  Hepatitis B vaccine.** / Consult your health care provider.  Haemophilus influenzae type b (Hib) vaccine.** / Consult your health care provider. Ages 22 years and over  Blood pressure check.** / Every year.  Lipid and cholesterol check.** / Every 5 years beginning at age 68 years.  Lung cancer screening. / Every year if you are aged 70-80 years and have a 30-pack-year history of smoking and currently smoke or have quit within the past 15 years. Yearly screening is stopped once you have quit smoking for at least 15 years or develop a health problem that would prevent you from having lung cancer treatment.  Clinical breast exam.** / Every year after age 58 years.  BRCA-related cancer risk assessment.** / For women who have family members with a BRCA-related cancer (breast, ovarian, tubal, or peritoneal cancers).  Mammogram.** / Every year beginning at age 59 years and continuing for as long as you are in good health. Consult with your health care provider.  Pap test.** / Every 3 years starting at age 47 years through age 32 or 26 years with 3 consecutive normal Pap tests. Testing can be stopped between 65 and 70 years with 3 consecutive normal Pap tests and no abnormal Pap or HPV tests in the past 10 years.  HPV screening.** / Every 3 years from ages 66 years through ages 17 or 80 years with a history of 3 consecutive normal Pap tests. Testing can be stopped between 65 and 70 years with 3 consecutive normal Pap tests and no abnormal Pap or HPV tests in the past 10 years.  Fecal occult blood test (FOBT) of stool. / Every year beginning at age 27 years and continuing until age 3 years. You may not need to do this test if you get a colonoscopy every 10 years.  Flexible sigmoidoscopy or colonoscopy.** / Every 5 years for a flexible sigmoidoscopy or every 10 years for a colonoscopy beginning at age 46 years and continuing  until age 63 years.  Hepatitis C blood test.** / For all people born from [birth year] through 1967 and any individual with known risks for hepatitis C.  Osteoporosis screening.** / A one-time screening for women ages 21 years and over and women at risk for fractures or osteoporosis.  Skin self-exam. / Monthly.  Influenza vaccine. / Every year.  Tetanus, diphtheria, and acellular pertussis (Tdap/Td) vaccine.** / 1 dose of Td every 10 years.  Varicella vaccine.** / Consult your health care provider.  Zoster vaccine.** / 1 dose for adults aged 52 years or older.  Pneumococcal 13-valent conjugate (PCV13) vaccine.** / Consult your health care provider.  Pneumococcal polysaccharide (PPSV23) vaccine.** / 1 dose for all adults aged 63 years and older.  Meningococcal vaccine.** / Consult your health care provider.  Hepatitis A vaccine.** / Consult your health care provider.  Hepatitis B vaccine.** / Consult your health care provider.  Haemophilus influenzae type b (Hib) vaccine.** / Consult your health care provider. ** Family history and personal history of risk  and conditions may change your health care provider's recommendations.   This information is not intended to replace advice given to you by your health care provider. Make sure you discuss any questions you have with your health care provider.   Document Released: 03/04/2001 Document Revised: 01/27/2014 Document Reviewed: 06/03/2010 Elsevier Interactive Patient Education [employer].

## 2015-06-21 NOTE — Telephone Encounter (Signed)
I ran the dipstick and it showed 2+ leukocytes in the urine. I spoke with the pt and she states that she had not had any painful urination symptoms and she stated that she had not had any dysuria or back pain but I put in a culture.

## 2015-06-22 LAB — URINE CULTURE

## 2015-06-22 LAB — HIV ANTIBODY (ROUTINE TESTING W REFLEX): HIV 1&2 Ab, 4th Generation: NONREACTIVE

## 2015-06-27 ENCOUNTER — Encounter: Payer: Self-pay | Admitting: Medical

## 2015-06-28 ENCOUNTER — Inpatient Hospital Stay (HOSPITAL_BASED_OUTPATIENT_CLINIC_OR_DEPARTMENT_OTHER): Admission: RE | Admit: 2015-06-28 | Payer: 59 | Source: Ambulatory Visit

## 2015-06-29 ENCOUNTER — Other Ambulatory Visit: Payer: 59

## 2015-07-03 ENCOUNTER — Other Ambulatory Visit (INDEPENDENT_AMBULATORY_CARE_PROVIDER_SITE_OTHER): Payer: 59

## 2015-07-03 ENCOUNTER — Ambulatory Visit (HOSPITAL_BASED_OUTPATIENT_CLINIC_OR_DEPARTMENT_OTHER)
Admission: RE | Admit: 2015-07-03 | Discharge: 2015-07-03 | Disposition: A | Discharge status: Home / Self Care | Payer: 59 | Source: Ambulatory Visit | Attending: Medical | Admitting: Medical

## 2015-07-03 DIAGNOSIS — Z1239 Encounter for other screening for malignant neoplasm of breast: Secondary | ICD-10-CM | POA: Insufficient documentation

## 2015-07-03 DIAGNOSIS — Z1211 Encounter for screening for malignant neoplasm of colon: Secondary | ICD-10-CM | POA: Diagnosis not present

## 2015-07-03 DIAGNOSIS — Z1231 Encounter for screening mammogram for malignant neoplasm of breast: Secondary | ICD-10-CM | POA: Diagnosis not present

## 2015-07-03 DIAGNOSIS — R82998 Other abnormal findings in urine: Secondary | ICD-10-CM

## 2015-07-03 DIAGNOSIS — Z0000 Encounter for general adult medical examination without abnormal findings: Secondary | ICD-10-CM

## 2015-07-03 DIAGNOSIS — Z114 Encounter for screening for human immunodeficiency virus [HIV]: Secondary | ICD-10-CM

## 2015-07-03 NOTE — Addendum Note (Signed)
Addended by: Peggyann Shoals on: 07/03/2015 11:05 AM   Modules accepted: Orders

## 2015-07-04 LAB — FECAL OCCULT BLOOD, IMMUNOCHEMICAL: Fecal Occult Bld: NEGATIVE

## 2015-07-10 NOTE — Progress Notes (Signed)
Quick Note:  Pt has seen results on MyChart and message also sent for patient to call back if any questions. ______ 

## 2015-09-04 ENCOUNTER — Other Ambulatory Visit: Payer: Self-pay | Admitting: Family Medicine

## 2015-09-04 MED ORDER — DIBUCAINE 1 % EX OINT: TOPICAL_OINTMENT | Freq: Three times a day (TID) | CUTANEOUS | 0 refills | Status: DC | PRN

## 2015-09-04 NOTE — Telephone Encounter (Signed)
Please advise 

## 2015-09-04 NOTE — Telephone Encounter (Signed)
Called patient no answer at this time. Unable to leave message on answering machine.

## 2015-09-04 NOTE — Telephone Encounter (Signed)
°  Relation to [id number] Call back number:[phone] Pharmacy:[phone]  Reason for call: pt is needing rx nupercainal (dibucaine) for spasms, states Dr. Birdie Riddle has written it for her to use as needed, however the tube she has now has expired and she needs a new one. The rx is not in her meds list however pt states she is having a flare up now and really needs to the rx called in, states she had spoken with Percell Miller about it when she had her CPE.

## 2015-09-04 NOTE — Telephone Encounter (Signed)
I looked through past note and reviewed prior GI note and also saw dibucaine on her on her old med lists. Saw that prior rx by Dr. Birdie Riddle. I did refill one time but further refills should be done by pcp. Looks like she has Dr. Lorelei Pont listed.

## 2015-09-25 ENCOUNTER — Other Ambulatory Visit: Payer: Self-pay | Admitting: Medical

## 2015-09-25 ENCOUNTER — Other Ambulatory Visit: Payer: Self-pay | Admitting: *Deleted

## 2015-09-25 ENCOUNTER — Telehealth: Payer: Self-pay | Admitting: Medical

## 2015-09-25 ENCOUNTER — Other Ambulatory Visit (INDEPENDENT_AMBULATORY_CARE_PROVIDER_SITE_OTHER): Payer: 59

## 2015-09-25 DIAGNOSIS — E039 Hypothyroidism, unspecified: Secondary | ICD-10-CM | POA: Diagnosis not present

## 2015-09-25 LAB — TSH: TSH: 0.1 u[IU]/mL — ABNORMAL LOW (ref 0.35–4.50)

## 2015-09-25 NOTE — Telephone Encounter (Signed)
Due for TSH, orders placed. Pt notified and lab appt scheduled.

## 2015-09-25 NOTE — Telephone Encounter (Signed)
Caller name: Teffany  Relation to pt: self  Call back number: [phone] Pharmacy: [facility] [address]  Reason for call:  Pt called to inform today she took her last pill for rx levothyroxine (Synthroid, Levothroid) 125 MCG tablet, pt would like to know if possible to verify if she needs labs for her synthroid level to be verified before getting rx (pt states last time did the labs was fasting and she willing to wait to have it done today-so she is fasting), if not she does needs refill on her Levothroxine (Synthroid, Levothroid) 125 MCG. Please advise ASAP.

## 2015-09-25 NOTE — Telephone Encounter (Signed)
I tried to call pt home and cell. Accidentally called her work phone. Left a vague message about calling to discuss labs(no specific info given and erased message). Then realized work machine. I followed prompts to erase message 6 and then#.

## 2015-09-26 ENCOUNTER — Telehealth: Payer: Self-pay | Admitting: Medical

## 2015-09-26 NOTE — Telephone Encounter (Signed)
Pt returned call for lab results.

## 2015-09-26 NOTE — Telephone Encounter (Signed)
Patient informed of results.  

## 2015-09-28 ENCOUNTER — Encounter: Payer: Self-pay | Admitting: Medical

## 2015-09-28 ENCOUNTER — Telehealth: Payer: Self-pay | Admitting: Medical

## 2015-09-28 ENCOUNTER — Ambulatory Visit (INDEPENDENT_AMBULATORY_CARE_PROVIDER_SITE_OTHER): Payer: 59 | Admitting: Medical

## 2015-09-28 VITALS — BP 118/80 | HR 66 | Temp 98.2°F | Ht 66.0 in | Wt 201.4 lb

## 2015-09-28 DIAGNOSIS — E038 Other specified hypothyroidism: Secondary | ICD-10-CM | POA: Diagnosis not present

## 2015-09-28 DIAGNOSIS — R5383 Other fatigue: Secondary | ICD-10-CM

## 2015-09-28 DIAGNOSIS — E039 Hypothyroidism, unspecified: Secondary | ICD-10-CM

## 2015-09-28 LAB — CBC WITH DIFFERENTIAL/PLATELET
BASOS PCT: 0.7 % (ref 0.0–3.0)
Basophils Absolute: 0 10*3/uL (ref 0.0–0.1)
EOS ABS: 0.2 10*3/uL (ref 0.0–0.7)
Eosinophils Relative: 3.3 % (ref 0.0–5.0)
HEMATOCRIT: 40.5 % (ref 36.0–46.0)
HEMOGLOBIN: 13.8 g/dL (ref 12.0–15.0)
LYMPHS PCT: 40.8 % (ref 12.0–46.0)
Lymphs Abs: 2.8 10*3/uL (ref 0.7–4.0)
MCHC: 34.2 g/dL (ref 30.0–36.0)
MCV: 82.9 fl (ref 78.0–100.0)
MONOS PCT: 8.3 % (ref 3.0–12.0)
Monocytes Absolute: 0.6 10*3/uL (ref 0.1–1.0)
NEUTROS ABS: 3.2 10*3/uL (ref 1.4–7.7)
Neutrophils Relative %: 46.9 % (ref 43.0–77.0)
PLATELETS: 236 10*3/uL (ref 150.0–400.0)
RBC: 4.88 Mil/uL (ref 3.87–5.11)
RDW: 13.8 % (ref 11.5–15.5)
WBC: 6.8 10*3/uL (ref 4.0–10.5)

## 2015-09-28 LAB — COMPREHENSIVE METABOLIC PANEL
ALBUMIN: 3.9 g/dL (ref 3.5–5.2)
ALK PHOS: 64 U/L (ref 39–117)
ALT: 21 U/L (ref 0–35)
AST: 20 U/L (ref 0–37)
BILIRUBIN TOTAL: 0.3 mg/dL (ref 0.2–1.2)
BUN: 10 mg/dL (ref 6–23)
CALCIUM: 9 mg/dL (ref 8.4–10.5)
CO2: 30 mEq/L (ref 19–32)
Chloride: 106 mEq/L (ref 96–112)
Creatinine, Ser: 0.73 mg/dL (ref 0.40–1.20)
GFR: 90.68 mL/min (ref >60.00)
GLUCOSE: 93 mg/dL (ref 70–99)
Potassium: 4.3 mEq/L (ref 3.5–5.1)
Sodium: 139 mEq/L (ref 135–145)
TOTAL PROTEIN: 7.1 g/dL (ref 6.0–8.3)

## 2015-09-28 LAB — TSH: TSH: 0.24 u[IU]/mL — ABNORMAL LOW (ref 0.35–4.50)

## 2015-09-28 LAB — T4, FREE: Free T4: 0.81 ng/dL (ref 0.60–1.60)

## 2015-09-28 LAB — T3, FREE: T3, Free: 3 pg/mL (ref 2.3–4.2)

## 2015-09-28 MED ORDER — LEVOTHYROXINE SODIUM 112 MCG PO TABS: 112.0000 ug | ORAL_TABLET | Freq: Every day | ORAL | 1 refills | Status: DC

## 2015-09-28 NOTE — Telephone Encounter (Addendum)
I called pt and advised on labs, new lower dose thyroid med and referral to endocrinologist.

## 2015-09-28 NOTE — Progress Notes (Signed)
Subjective:    Patient ID: Karen Merritt, female    DOB: Oct 24, 1970, 45 y.o.   MRN: [id number]  HPI   Pt here for follow up. She had low tsh recently. Pt has been feeling fatigued and feels like something in her throat. But states this faint low level sensation of something in throat feeling is chronic for years and not new. Pt had history of thyroidectomy years ago.  Pt not having palpitation. No tachycardia.   Pt levothyroxine med just ran out on Tuesday. This is third day of not getting on medication.  Pt initially saw Dr. Loanne Drilling and some difficulty finding rt dose. But after 2 years of it being controlled switched to Dr. Birdie Riddle.   Past Medical History:  Diagnosis Date  . Chest pain    a. non-cardiac 2005, 2012.  Marland Kitchen Hemorrhoids 11-21-2009   colonoscopy  . 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 Hypothyroidism    a. s/p thyroidectomy ~ 1998  . Palpitations      Social History   Social History  . Marital status: Divorced    Spouse name: N/A  . Number of children: 2  . Years of education: N/A   Occupational History  . [place] History Main Topics  . Smoking status: Never Smoker  . Smokeless tobacco: Never Used  . Alcohol use No  . Drug use: No  . Sexual activity: Not on file   Other Topics Concern  . Not on file   Social History Narrative   Lives in [place].  Active, walks/jogs.  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 bone spur  6/11   right big toe  . SHOULDER ARTHROSCOPY WITH SUBACROMIAL DECOMPRESSION AND OPEN ROTATOR C Right 05/23/2013   Procedure: RIGHT SHOULDER ARTHROSCOPY WITH SUBACROMIAL DECOMPRESSION AND MINI OPEN ROTATOR CUFF REPAIR, AND DISTAL CLAVICLE RESECTION;  Surgeon: Augustin Schooling, MD;  Location: [facility];  Service: Orthopedics;  Laterality: Right;  . stress cardiolite  05/23/03  . THYROIDECTOMY  12/29/96  . TOTAL ABDOMINAL HYSTERECTOMY  2008     Family History  Problem Relation Age of Onset  . Bone cancer Maternal Aunt   . Thyroid disease Maternal Aunt   . Liver cancer Maternal Aunt   . Hypertension Mother   . Colon polyps Mother   . Coronary artery disease Mother     alive @ 23.  Marland Kitchen Heart disease Mother 60    10/20/13  . Hypertension Maternal Grandmother   . Diabetes Maternal Grandmother   . Coronary artery disease Maternal Grandfather   . Coronary artery disease Maternal Uncle   . Hypertension Maternal Uncle   . Heart disease Maternal Aunt   . Other Father     alive and well @ 18.    No Known Allergies  Current Outpatient Prescriptions on File Prior to Visit  Medication Sig Dispense Refill  . dibucaine (NUPERCAINAL) 1 % ointment Apply topically 3 (three) times daily as needed for pain. 30 g 0  . levothyroxine (SYNTHROID, LEVOTHROID) 125 MCG tablet Take 1 tablet (125 mcg total) by mouth daily. 90 tablet 0  . Melatonin 5 MG TABS Take 5 mg by mouth at bedtime as needed (sleep).     No current facility-administered medications on file prior to visit.     BP 118/80   Pulse 66   Temp 98.2 F (36.8 C) (Oral)   Ht  5\' 6"  (1.676 m)   Wt 201 lb 6.4 oz (91.4 kg)   SpO2 98%   BMI 32.51 kg/m      Review of Systems  Constitutional: Positive for fatigue. Negative for chills and fever.  HENT: Negative for congestion, ear discharge, ear pain, postnasal drip, rhinorrhea, sinus pressure and sore throat.   Respiratory: Negative for cough, chest tightness, shortness of breath and wheezing.   Cardiovascular: Negative for chest pain and palpitations.  Gastrointestinal: Negative for abdominal pain.  Endocrine: Negative for cold intolerance, heat intolerance, polydipsia, polyphagia and polyuria.  Musculoskeletal: Negative for back pain.  Skin: Negative for rash.  Hematological: Negative for adenopathy. Does not bruise/bleed easily.  Psychiatric/Behavioral: Negative for behavioral problems and confusion.        Objective:   Physical Exam  General Mental Status- Alert. General Appearance- Not in acute distress.   Skin General: Color- Normal Color. Moisture- Normal Moisture.  Neck Carotid Arteries- Normal color. Moisture- Normal Moisture. No carotid bruits. No JVD. No thyromegaly and no mass felt on palpation. Appears to have old scar.  Chest and Lung Exam Auscultation: Breath Sounds:-Normal. CTA.  Cardiovascular Auscultation:Rythm- Regular. Murmurs & Other Heart Sounds:Auscultation of the heart reveals- No Murmurs.  Abdomen Inspection:-Inspeection Normal. Palpation/Percussion:Note:No mass. Palpation and Percussion of the abdomen reveal- Non Tender, Non Distended + BS, no rebound or guarding.    Neurologic Cranial Nerve exam:- CN III-XII intact(No nystagmus), symmetric smile. Strength:- 5/5 equal and symmetric strength both upper and lower extremities.      Assessment & Plan:  For your hypothyroid history and low tsh recently  will order tsh, t3, and t4 studies today.  When I get back results will very likley write lower dose and may refer you back to Dr. Loanne Drilling since you do have history of fluctuating thyroid level.  Also will include cbc and cmp today for your fatigue.  Note as stated will probably refer her to endocrinologist.   Regarding her faint chronic low level sensation of something stuck in her throat might try trial of med for reflux in future and recommend gerd diet. Possible refer to GI.   Follow up date to be determined.

## 2015-09-28 NOTE — Patient Instructions (Addendum)
For your hypothyroid history and low tsh recently  will order tsh, t3, and t4 studies today.  When I get back results will very likley write lower dose and may refer you back to Dr. Loanne Drilling since you do have history of fluctuating thyroid level.  Also will include cbc and cmp today for your fatigue.  Follow up date to be determined.

## 2015-10-10 ENCOUNTER — Ambulatory Visit: Payer: 59 | Admitting: Endocrinology

## 2015-10-29 ENCOUNTER — Ambulatory Visit (INDEPENDENT_AMBULATORY_CARE_PROVIDER_SITE_OTHER): Payer: 59 | Admitting: Endocrinology

## 2015-10-29 ENCOUNTER — Encounter: Payer: Self-pay | Admitting: Endocrinology

## 2015-10-29 VITALS — BP 118/60 | HR 75 | Ht 66.0 in | Wt 200.0 lb

## 2015-10-29 DIAGNOSIS — R200 Anesthesia of skin: Secondary | ICD-10-CM | POA: Diagnosis not present

## 2015-10-29 DIAGNOSIS — E890 Postprocedural hypothyroidism: Secondary | ICD-10-CM

## 2015-10-29 DIAGNOSIS — E559 Vitamin D deficiency, unspecified: Secondary | ICD-10-CM | POA: Diagnosis not present

## 2015-10-29 LAB — VITAMIN D 25 HYDROXY (VIT D DEFICIENCY, FRACTURES): VITD: 15.55 ng/mL — AB (ref 30.00–100.00)

## 2015-10-29 LAB — TSH: TSH: 0.31 u[IU]/mL — ABNORMAL LOW (ref 0.35–4.50)

## 2015-10-29 MED ORDER — LEVOTHYROXINE SODIUM 100 MCG PO TABS: 100.0000 ug | ORAL_TABLET | Freq: Every day | ORAL | 3 refills | Status: DC

## 2015-10-29 MED ORDER — VITAMIN D (ERGOCALCIFEROL) 1.25 MG (50000 UNIT) PO CAPS: 50000.0000 [IU] | ORAL_CAPSULE | ORAL | 0 refills | Status: DC

## 2015-10-29 NOTE — Patient Instructions (Addendum)
blood tests are requested for you today.  We'll let you know about the results.  

## 2015-10-29 NOTE — Progress Notes (Signed)
Subjective:    Patient ID: Karen Merritt, female    DOB: 1970/12/25, 45 y.o.   MRN: [id number]  HPI Referred by E. Saguier, for hypothyroidism.  pt had thyroidectomy in 1998 due to the mass effect of a multinodular goiter.  she takes synthroid 112 micrograms/day, as rx'ed. She has slight palpitations in the chest, and assoc fatigue.  She says she never misses the synthroid.   Past Medical History:  Diagnosis Date  . Chest pain    a. non-cardiac 2005, 2012.  Marland Kitchen Hemorrhoids 11-21-2009   colonoscopy  . 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 Hypothyroidism    a. s/p thyroidectomy ~ 1998  . Palpitations     Past Surgical History:  Procedure Laterality Date  . bone spur  6/11   right big toe  . SHOULDER ARTHROSCOPY WITH SUBACROMIAL DECOMPRESSION AND OPEN ROTATOR C Right 05/23/2013   Procedure: RIGHT SHOULDER ARTHROSCOPY WITH SUBACROMIAL DECOMPRESSION AND MINI OPEN ROTATOR CUFF REPAIR, AND DISTAL CLAVICLE RESECTION;  Surgeon: Augustin Schooling, MD;  Location: [facility];  Service: Orthopedics;  Laterality: Right;  . stress cardiolite  05/23/03  . THYROIDECTOMY  12/29/96  . TOTAL ABDOMINAL HYSTERECTOMY  2008    Social History   Social History  . Marital status: Divorced    Spouse name: N/A  . Number of children: 2  . Years of education: N/A   Occupational History  . [employer] History Main Topics  . Smoking status: Never Smoker  . Smokeless tobacco: Never Used  . Alcohol use No  . Drug use: No  . Sexual activity: Not on file   Other Topics Concern  . Not on file   Social History Narrative   Lives in [place].  Active, walks/jogs.  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    Current Outpatient Prescriptions on File Prior to Visit  Medication Sig Dispense Refill  . dibucaine (NUPERCAINAL) 1 % ointment Apply topically 3 (three) times daily as needed for pain. 30 g 0  . Melatonin 5 MG TABS Take 5 mg by mouth at bedtime as needed (sleep).      No current facility-administered medications on file prior to visit.     No Known Allergies  Family History  Problem Relation Age of Onset  . Bone cancer Maternal Aunt   . Thyroid disease Maternal Aunt   . Liver cancer Maternal Aunt   . Hypertension Mother   . Colon polyps Mother   . Coronary artery disease Mother     alive @ 56.  Marland Kitchen Heart disease Mother 45    10/20/13  . Hypertension Maternal Grandmother   . Diabetes Maternal Grandmother   . Coronary artery disease Maternal Grandfather   . Coronary artery disease Maternal Uncle   . Hypertension Maternal Uncle   . Heart disease Maternal Aunt   . Other Father     alive and well @ 50.   BP 118/60 (BP Location: Left Arm, Patient Position: Sitting)   Pulse 75   Ht 5\' 6"  (1.676 m)   Wt 200 lb (90.7 kg)   SpO2 95%   BMI 32.28 kg/m   Review of Systems denies muscle cramps, sob, depression, constipation, blurry vision, cold intolerance, myalgias, dry skin, rhinorrhea, easy bruising, and syncope.  She has insomnia and weight gain.  She has acral numbness.      Objective:   Physical Exam VS: see vs page GEN: no distress HEAD: head: no deformity  eyes: no periorbital swelling, no proptosis external nose and ears are normal mouth: no lesion seen NECK: a healed scar is present.  i do not appreciate a nodule in the thyroid or elsewhere in the neck CHEST WALL: no deformity LUNGS: clear to auscultation CV: reg rate and rhythm, no murmur ABD: abdomen is soft, nontender.  no hepatosplenomegaly.  not distended.  no hernia MUSCULOSKELETAL: muscle bulk and strength are grossly normal.  no obvious joint swelling.  gait is normal and steady EXTEMITIES: no deformity.  no ulcer on the feet.  feet are of normal color and temp.  no edema PULSES: dorsalis pedis intact bilat.  no carotid bruit NEURO:  cn 2-12 grossly intact.   readily moves all 4's.  sensation is intact to touch on the feet SKIN:  Normal texture and temperature.  No rash or  suspicious lesion is visible.   NODES:  None palpable at the neck PSYCH: alert, well-oriented.  Does not appear anxious nor depressed.   Lab Results  Component Value Date   TSH 0.24 (L) 09/28/2015   I have reviewed outside records, and summarized: Pt was noted to have variable synthroid requirement, and referred here.  i personally reviewed electrocardiogram tracing (08/11/14):  Indication: palpitations. Impression: normal  Vit-D=15    Assessment & Plan:  Postsurgical hypothyroidism, slightly overreplaced.  I have sent a prescription to your pharmacy, to reduce Palpitations, new to me.  In this setting, we should adjust synthroid for even mildly abnormal TSH.   Vit-D deficiency, new.  I have sent a prescription to your pharmacy, for ergocalciferol.  Recheck in 1 month

## 2015-10-30 ENCOUNTER — Encounter: Payer: Self-pay | Admitting: Endocrinology

## 2015-10-30 LAB — PTH, INTACT AND CALCIUM
CALCIUM: 9.1 mg/dL (ref 8.6–10.2)
PTH: 34 pg/mL (ref 14–64)

## 2015-11-15 ENCOUNTER — Encounter: Payer: Self-pay | Admitting: Endocrinology

## 2015-11-27 ENCOUNTER — Other Ambulatory Visit (INDEPENDENT_AMBULATORY_CARE_PROVIDER_SITE_OTHER): Payer: 59

## 2015-11-27 ENCOUNTER — Other Ambulatory Visit: Payer: Self-pay | Admitting: Endocrinology

## 2015-11-27 DIAGNOSIS — E559 Vitamin D deficiency, unspecified: Secondary | ICD-10-CM

## 2015-11-27 DIAGNOSIS — E890 Postprocedural hypothyroidism: Secondary | ICD-10-CM | POA: Diagnosis not present

## 2015-11-27 LAB — TSH: TSH: 0.31 u[IU]/mL — AB (ref 0.35–4.50)

## 2015-11-27 LAB — VITAMIN D 25 HYDROXY (VIT D DEFICIENCY, FRACTURES): VITD: 35.27 ng/mL (ref 30.00–100.00)

## 2015-11-27 MED ORDER — LEVOTHYROXINE SODIUM 75 MCG PO TABS: 75.0000 ug | ORAL_TABLET | Freq: Every day | ORAL | 11 refills | Status: DC

## 2015-11-28 ENCOUNTER — Encounter: Payer: Self-pay | Admitting: Endocrinology

## 2015-11-28 ENCOUNTER — Other Ambulatory Visit: Payer: Self-pay | Admitting: Endocrinology

## 2015-11-28 DIAGNOSIS — E559 Vitamin D deficiency, unspecified: Secondary | ICD-10-CM

## 2015-11-28 DIAGNOSIS — E890 Postprocedural hypothyroidism: Secondary | ICD-10-CM

## 2015-11-30 ENCOUNTER — Other Ambulatory Visit: Payer: 59

## 2015-12-11 ENCOUNTER — Encounter: Payer: Self-pay | Admitting: Family

## 2015-12-11 ENCOUNTER — Ambulatory Visit (INDEPENDENT_AMBULATORY_CARE_PROVIDER_SITE_OTHER): Payer: 59 | Admitting: Family

## 2015-12-11 ENCOUNTER — Ambulatory Visit: Payer: 59 | Admitting: Family

## 2015-12-11 ENCOUNTER — Telehealth: Payer: Self-pay | Admitting: Medical

## 2015-12-11 VITALS — BP 132/82 | HR 59 | Temp 98.0°F | Resp 16 | Ht 60.0 in | Wt 197.2 lb

## 2015-12-11 DIAGNOSIS — G43809 Other migraine, not intractable, without status migrainosus: Secondary | ICD-10-CM

## 2015-12-11 MED ORDER — KETOROLAC TROMETHAMINE 60 MG/2ML IM SOLN
60.0000 mg | Freq: Once | INTRAMUSCULAR | Status: AC
  Administered 2015-12-11: 60 mg via INTRAMUSCULAR

## 2015-12-11 MED ORDER — SUMATRIPTAN SUCCINATE 50 MG PO TABS: ORAL_TABLET | ORAL | 0 refills | Status: DC

## 2015-12-11 NOTE — Progress Notes (Signed)
Subjective:    Patient ID: Karen Merritt, female    DOB: [birth year], 45 y.o.   MRN: [id number]  HPI   Karen Merritt is a 45 yr old female who presents today with chief complaint of headache. Headache began yesterday. HA is located in the right occiptal region. HA is currently 8/10.  Pain radiates into the right side of her face. Reports that she has brief intermittent improvement. tylenoal does not help. Scalp is painful.  She reports + associated dizziness.  + Photophobia, denies NV. She denies hx of migraines.  She has had hx of similar HA's in the past but has not lasted as long. She denies associated weakness. She has hx or Carpal tunnel Syndrome. She reports that she has some tingling in the fingers of her left hand today which is not new.      Review of Systems See HPI  Past Medical History:  Diagnosis Date  . Chest pain    a. non-cardiac 2005, 2012.  Marland Kitchen Hemorrhoids 11-21-2009   colonoscopy  . 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 Hypothyroidism    a. s/p thyroidectomy ~ 1998  . Palpitations      Social History   Social History  . Marital status: Divorced    Spouse name: N/A  . Number of children: 2  . Years of education: N/A   Occupational History  . [place] History Main Topics  . Smoking status: Never Smoker  . Smokeless tobacco: Never Used  . Alcohol use No  . Drug use: No  . Sexual activity: Not on file   Other Topics Concern  . Not on file   Social History Narrative   Lives in [place].  Active, walks/jogs.  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 bone spur  6/11   right big toe  . SHOULDER ARTHROSCOPY WITH SUBACROMIAL DECOMPRESSION AND OPEN ROTATOR C Right 05/23/2013   Procedure: RIGHT SHOULDER ARTHROSCOPY WITH SUBACROMIAL DECOMPRESSION AND MINI OPEN ROTATOR CUFF REPAIR, AND DISTAL CLAVICLE RESECTION;  Surgeon: Augustin Schooling, MD;  Location: [facility];  Service:  Orthopedics;  Laterality: Right;  . stress cardiolite  05/23/03  . THYROIDECTOMY  12/29/96  . TOTAL ABDOMINAL HYSTERECTOMY  2008    Family History  Problem Relation Age of Onset  . Bone cancer Maternal Aunt   . Thyroid disease Maternal Aunt   . Liver cancer Maternal Aunt   . Hypertension Mother   . Colon polyps Mother   . Coronary artery disease Mother     alive @ 15.  Marland Kitchen Heart disease Mother 42    10/20/13  . Hypertension Maternal Grandmother   . Diabetes Maternal Grandmother   . Coronary artery disease Maternal Grandfather   . Coronary artery disease Maternal Uncle   . Hypertension Maternal Uncle   . Heart disease Maternal Aunt   . Other Father     alive and well @ 16.    No Known Allergies  Current Outpatient Prescriptions on File Prior to Visit  Medication Sig Dispense Refill  . dibucaine (NUPERCAINAL) 1 % ointment Apply topically 3 (three) times daily as needed for pain. 30 g 0  . levothyroxine (SYNTHROID, LEVOTHROID) 75 MCG tablet Take 1 tablet (75 mcg total) by mouth daily before breakfast. 30 tablet 11  . Melatonin 5 MG TABS Take 5 mg by mouth at bedtime as needed (sleep).  No current facility-administered medications on file prior to visit.     BP 132/82   Pulse (!) 59   Temp 98 F (36.7 C) (Oral)   Resp 16   Ht 5' (1.524 m)   Wt 197 lb 3.2 oz (89.4 kg)   SpO2 98% Comment: room air  BMI 38.51 kg/m       Objective:   Physical Exam  Constitutional: She is oriented to person, place, and time. She appears well-developed and well-nourished.  Eyes: EOM are normal. Pupils are equal, round, and reactive to light.  Cardiovascular: Normal rate, regular rhythm and normal heart sounds.   No murmur heard. Pulmonary/Chest: Effort normal and breath sounds normal. No respiratory distress. She has no wheezes.  Neurological: She is alert and oriented to person, place, and time. No cranial nerve deficit. She exhibits normal muscle tone. Coordination normal.  + Tinels,  + phalans left hand  Skin: Skin is warm and dry.  Psychiatric: She has a normal mood and affect. Her behavior is normal. Judgment and thought content normal.          Assessment & Plan:  Migraine- no acute symptoms of CVA.  History is consistent with acute migraine.  Pt was given IM toradol injection as well as prn imitrex to use at home. She is advised on signs/symptoms of stroke and is advised to go to the ER if these symptoms occur.  She is advised to follow up with PCP in 1 week.  BP is elevated today- likely due to pain, this will need to be readdressed at her follow up in 1 week.

## 2015-12-11 NOTE — Telephone Encounter (Signed)
[facility] - Client [facility]  Patient Name: Karen Merritt  DOB: [birth year]    Initial Comment Caller states she has a sharp throbbing pain on right side of head, jerking head to the left, tried medications, but nothing worked since yesterday morning.    Nurse Assessment  Nurse: Wynetta Emery, RN, Baker Janus Date/Time Eilene Ghazi Time): 12/11/2015 8:47:51 AM  Confirm and document reason for call. If symptomatic, describe symptoms. You must click the next button to save text entered. ---Caller states she has a sharp throbbing pain on right side of head, behind right ear to center of head; jerking head to the left, tried medications, but nothing worked since yesterday morning. every ten minutes and goes away but constant since yesterday 8am  Does the patient have any new or worsening symptoms? ---Yes  Will a triage be completed? ---Yes  Related visit to physician within the last 2 weeks? ---No  Does the PT have any chronic conditions? (i.e. diabetes, asthma, etc.) ---No  Is the patient pregnant or possibly pregnant? (Ask all females between the ages of 69-55) ---No  Is this a behavioral health or substance abuse call? ---No     Guidelines    Guideline Title Affirmed Question Affirmed Notes  Headache [1] SEVERE headache (e.g., excruciating) AND [2] "worst headache" of life    Final Disposition User   Go to ED Now (or PCP triage) Wynetta Emery, RN, Baker Janus    Comments  NOTE: 945am appt made with office staff with Debbrah Alar today   Referrals  REFERRED TO PCP OFFICE   Disagree/Comply: Comply

## 2015-12-11 NOTE — Patient Instructions (Signed)
If your migraine is not resolved with the toradol injection we gave you today you may take a tablet of imitrex.  If still not resolved 2 hours after imitrex you may take a second tablet.  (Max 2 tabs in 24 hours) Go to ER if you develop new numbness, weakness, worsening headache.

## 2015-12-11 NOTE — Telephone Encounter (Signed)
Appt scheduled for today by [facility].

## 2015-12-11 NOTE — Telephone Encounter (Signed)
Pt called in to schedule an appt. Pt says since yesterday she has been having a sharp pain in her head. Scheduled pt with PCP this morning at 9:30 am. ALSO, transferred pt to [facility] for triaging

## 2015-12-11 NOTE — Telephone Encounter (Signed)
Patient was routed back to our office from [facility] to confirm her appt. Patient thought appt was made for 12/11/15 but it was made for 12/12/15. I rescheduled patient with Melissa today at 9:45. [facility] stated that they would have a nurse call the patient. I informed patient that even though she has an appointment today, if the nurse recommends she go to the Ed, we recommend she take that advise.

## 2015-12-11 NOTE — Progress Notes (Signed)
Pre visit review using our clinic review tool, if applicable. No additional management support is needed unless otherwise documented below in the visit note. 

## 2015-12-12 ENCOUNTER — Ambulatory Visit: Payer: 59 | Admitting: Medical

## 2015-12-16 ENCOUNTER — Telehealth: Payer: Self-pay | Admitting: Endocrinology

## 2015-12-16 NOTE — Telephone Encounter (Signed)
Please call pt with the unread message, thanks.

## 2015-12-17 NOTE — Telephone Encounter (Signed)
I contacted the patient and advised of results stated below from 11/27/2015 via voicemail. Requested a call back if the patient would like to discuss further.  Vitamin-D is normal--d. All you need to take now is non-prescription vitamin-D, 2000 units per day. Also, we need to reduce the thyroid pill again. I have sent a prescription to your pharmacy. I hope you feel well.

## 2015-12-18 ENCOUNTER — Encounter: Payer: Self-pay | Admitting: Medical

## 2015-12-18 ENCOUNTER — Ambulatory Visit (INDEPENDENT_AMBULATORY_CARE_PROVIDER_SITE_OTHER): Payer: 59 | Admitting: Medical

## 2015-12-18 VITALS — BP 112/60 | HR 69 | Temp 98.4°F | Ht 66.0 in | Wt 196.8 lb

## 2015-12-18 DIAGNOSIS — R519 Headache, unspecified: Secondary | ICD-10-CM

## 2015-12-18 DIAGNOSIS — R51 Headache: Secondary | ICD-10-CM

## 2015-12-18 NOTE — Patient Instructions (Signed)
For recent ha that lasted much longer than any previous ha will advise that you use imitrex early on for ha. Document the response to the imitrex.   If you do use imitrex and then ha does not respond then be seen here or in ED. But if ha with neuruologic deficits then direct ED evaluation.   Will go ahead and refer to neurologist. You may benefit from imaging studies including mri.  Follow up in 3-4 weeks or as needed

## 2015-12-18 NOTE — Progress Notes (Signed)
Pre visit review using our clinic review tool, if applicable. No additional management support is needed unless otherwise documented below in the visit note. 

## 2015-12-18 NOTE — Progress Notes (Signed)
Subjective:    Patient ID: Karen Merritt, female    DOB: 19-Feb-1970, 45 y.o.   MRN: [id number]  HPI  Pt in for follow up. She had probable headache/migraine ha. Pt in the past had some occasional HA when pt was with Dr. Birdie Riddle. But she did not have prior diagnosis of migraine. On review today pt had no family history with aneurysm or ruptures.  When pt seen on 12-11-2015 she can't remember if had light or sound sensitive. But she does remember later rt eye pain and some eventual light sensitivity later in day. Pt not aware of family history of migraines. Pt states toradol did decrease/stop ha within 45 minutes on 12-11-2015.   Pt states on Tuesday night around 7 pm she thought ha might have been returning so she took imitrex. Her ha did not return to the same high level as when she saw provider in office.  Pt has been taking ibuprofen daily now to prevent to stop ha.   Pt states/clarifies in past has duration in past when Dr. Birdie Riddle was pcp her ha would last at most 30 minutes. On review pt does not have any history of imaging of brain.  Pt had ha history since 45 years of age per her report. Pt states ha frequency was 2-3 times a years. But only 30 minutes and responded to tylenol.   Pt most recent had lasted for 24 hours before she got the toradol. Did not respond to otc meds.  LMP- hysterectomy.    Review of Systems  Constitutional: Negative for chills and fatigue.  HENT: Negative for congestion, dental problem, drooling, postnasal drip, rhinorrhea and sinus pain.   Respiratory: Negative for cough, chest tightness, shortness of breath and wheezing.   Cardiovascular: Negative for chest pain and palpitations.  Gastrointestinal: Negative for abdominal pain.  Musculoskeletal: Negative for back pain.  Skin: Negative for rash.  Neurological: Negative for dizziness, syncope, facial asymmetry, weakness, light-headedness and numbness.  Hematological: Negative for adenopathy. Does not  bruise/bleed easily.  Psychiatric/Behavioral: Negative for agitation, behavioral problems, confusion, dysphoric mood and hallucinations.    Past Medical History:  Diagnosis Date  . Chest pain    a. non-cardiac 2005, 2012.  Marland Kitchen Hemorrhoids 11-21-2009   colonoscopy  . 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 Hypothyroidism    a. s/p thyroidectomy ~ 1998  . Palpitations      Social History   Social History  . Marital status: Divorced    Spouse name: N/A  . Number of children: 2  . Years of education: N/A   Occupational History  . [employer] History Main Topics  . Smoking status: Never Smoker  . Smokeless tobacco: Never Used  . Alcohol use No  . Drug use: No  . Sexual activity: Not on file   Other Topics Concern  . Not on file   Social History Narrative   Lives in [place].  Active, walks/jogs.  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 bone spur  6/11   right big toe  . SHOULDER ARTHROSCOPY WITH SUBACROMIAL DECOMPRESSION AND OPEN ROTATOR C Right 05/23/2013   Procedure: RIGHT SHOULDER ARTHROSCOPY WITH SUBACROMIAL DECOMPRESSION AND MINI OPEN ROTATOR CUFF REPAIR, AND DISTAL CLAVICLE RESECTION;  Surgeon: Augustin Schooling, MD;  Location: [facility];  Service: Orthopedics;  Laterality: Right;  . stress cardiolite  05/23/03  . THYROIDECTOMY  12/29/96  .  TOTAL ABDOMINAL HYSTERECTOMY  2008    Family History  Problem Relation Age of Onset  . Bone cancer Maternal Aunt   . Thyroid disease Maternal Aunt   . Liver cancer Maternal Aunt   . Hypertension Mother   . Colon polyps Mother   . Coronary artery disease Mother     alive @ 91.  Marland Kitchen Heart disease Mother 48    10/20/13  . Hypertension Maternal Grandmother   . Diabetes Maternal Grandmother   . Coronary artery disease Maternal Grandfather   . Coronary artery disease Maternal Uncle   . Hypertension Maternal Uncle   . Heart disease Maternal  Aunt   . Other Father     alive and well @ 21.    No Known Allergies  Current Outpatient Prescriptions on File Prior to Visit  Medication Sig Dispense Refill  . dibucaine (NUPERCAINAL) 1 % ointment Apply topically 3 (three) times daily as needed for pain. 30 g 0  . levothyroxine (SYNTHROID, LEVOTHROID) 75 MCG tablet Take 1 tablet (75 mcg total) by mouth daily before breakfast. 30 tablet 11  . Melatonin 5 MG TABS Take 5 mg by mouth at bedtime as needed (sleep).    . SUMAtriptan (IMITREX) 50 MG tablet Take 1 tablet at the start of the headache, may repeat in 2 hours if not resolved. 10 tablet 0   No current facility-administered medications on file prior to visit.     BP 112/60 (BP Location: Left Arm, Patient Position: Sitting, Cuff Size: Normal)   Pulse 69   Temp 98.4 F (36.9 C) (Oral)   Ht 5\' 6"  (1.676 m)   Wt 196 lb 12.8 oz (89.3 kg)   SpO2 98%   BMI 31.76 kg/m      Objective:   Physical Exam  General Mental Status- Alert. General Appearance- Not in acute distress.   Skin General: Color- Normal Color. Moisture- Normal Moisture.  Neck Carotid Arteries- Normal color. Moisture- Normal Moisture. No carotid bruits. No JVD.  Chest and Lung Exam Auscultation: Breath Sounds:-Normal.  Cardiovascular Auscultation:Rythm- Regular. Murmurs & Other Heart Sounds:Auscultation of the heart reveals- No Murmurs.  Abdomen Inspection:-Inspeection Normal. Palpation/Percussion:Note:No mass. Palpation and Percussion of the abdomen reveal- Non Tender, Non Distended + BS, no rebound or guarding.    Neurologic Cranial Nerve exam:- CN III-XII intact(No nystagmus), symmetric smile. Drift Test:- No drift. Romberg Exam:- Negative.  Finger to Nose:- Normal/Intact Strength:- 5/5 equal and symmetric strength both upper and lower extremities.      Assessment & Plan:  For recent ha that lasted much longer than any previous ha will advise that you use imitrex early on for ha. Document  the response to the imitrex.   If you do use imitrex and then ha does not respond then be seen here or in ED. But if ha with neuruologic deficits then direct ED evaluation.   Will go ahead and refer to neurologist. You may benefit from imaging studies including mri.  Follow up in 3-4 weeks or as needed  Aashika Carta, Percell Miller, [employer]

## 2015-12-19 ENCOUNTER — Encounter: Payer: Self-pay | Admitting: Neurology

## 2015-12-19 ENCOUNTER — Ambulatory Visit (INDEPENDENT_AMBULATORY_CARE_PROVIDER_SITE_OTHER): Payer: 59 | Admitting: Neurology

## 2015-12-19 VITALS — BP 115/75 | HR 64 | Ht 66.0 in | Wt 196.5 lb

## 2015-12-19 DIAGNOSIS — G4489 Other headache syndrome: Secondary | ICD-10-CM | POA: Diagnosis not present

## 2015-12-19 MED ORDER — ALPRAZOLAM 0.5 MG PO TABS: ORAL_TABLET | ORAL | 0 refills | Status: DC

## 2015-12-19 MED ORDER — PREDNISONE 5 MG PO TABS: ORAL_TABLET | ORAL | 0 refills | Status: DC

## 2015-12-19 NOTE — Progress Notes (Signed)
Reason for visit: Headache  Referring physician: Dr. Manley Mason is a 45 y.o. female  History of present illness:  Ms. Pillado is a 45 year old right-handed black female with a history of headaches since she was in her 23s. The headaches usually occur on average once a week, are bifrontal in nature, and are relatively mild. Her usual headaches respond to ibuprofen or Tylenol with full resolution. Approximately 9 days prior to this evaluation, the patient had onset of a headache that was unusual for her. The headache is in the right occipital area, with referred pain behind the right eye. The patient has developed numbness and tingling sensations into the left arm and left hand without weakness. She denies any numbness on the face or body or leg. She has pounding sensations with the headache, she may have some gait instability associated with this. She reports some blurred vision, but no loss of vision. She has had some nausea without vomiting. She does have photophobia without phonophobia. She has been taking Imitrex without benefit. Ibuprofen and Tylenol are not helping. She has some mild cognitive clouding with the headache. She indicates that she has not had a headache similar to this in the past. She does have a family history of migraine, a maternal aunt, a brother and a sister also has migraine.  Past Medical History:  Diagnosis Date  . Chest pain    a. non-cardiac 2005, 2012.  Marland Kitchen Hemorrhoids 11-21-2009   colonoscopy  . 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 Hypothyroidism    a. s/p thyroidectomy ~ 1998  . Palpitations     Past Surgical History:  Procedure Laterality Date  . bone spur  6/11   right big toe  . SHOULDER ARTHROSCOPY WITH SUBACROMIAL DECOMPRESSION AND OPEN ROTATOR C Right 05/23/2013   Procedure: RIGHT SHOULDER ARTHROSCOPY WITH SUBACROMIAL DECOMPRESSION AND MINI OPEN ROTATOR CUFF REPAIR, AND DISTAL CLAVICLE RESECTION;  Surgeon: Augustin Schooling,  MD;  Location: [facility];  Service: Orthopedics;  Laterality: Right;  . stress cardiolite  05/23/03  . THYROIDECTOMY  12/29/96  . TOTAL ABDOMINAL HYSTERECTOMY  2008    Family History  Problem Relation Age of Onset  . Hypertension Mother   . Colon polyps Mother   . Coronary artery disease Mother     alive @ 23.  Marland Kitchen Heart disease Mother 72    10/20/13  . Stroke Mother   . Other Father     alive and well @ 74.  Marland Kitchen Hypertension Sister   . Bone cancer Maternal Aunt   . Thyroid disease Maternal Aunt   . Liver cancer Maternal Aunt   . Hypertension Maternal Grandmother   . Diabetes Maternal Grandmother   . Coronary artery disease Maternal Grandfather   . Coronary artery disease Maternal Uncle   . Hypertension Maternal Uncle   . Heart disease Maternal Aunt     Social history:  reports that she has never smoked. She has never used smokeless tobacco. She reports that she does not drink alcohol or use drugs.  Medications:  Prior to Admission medications   Medication Sig Start Date End Date Taking? Authorizing Provider  dibucaine (NUPERCAINAL) 1 % ointment Apply topically 3 (three) times daily as needed for pain. 09/04/15  Yes Edward Saguier, PA-C  ibuprofen (ADVIL,MOTRIN) 200 MG tablet Take 800 mg by mouth every 6 (six) hours as needed.   Yes Historical Provider, MD  levothyroxine (SYNTHROID, LEVOTHROID) 75 MCG tablet Take 1 tablet (75 mcg  total) by mouth daily before breakfast. 11/27/15  Yes Renato Shin, MD  Melatonin 3 MG TABS Take 3 mg by mouth at bedtime as needed (sleep).    Yes Historical Provider, MD  SUMAtriptan (IMITREX) 50 MG tablet Take 1 tablet at the start of the headache, may repeat in 2 hours if not resolved. 12/11/15  Yes Debbrah Alar, NP     No Known Allergies  ROS:  Out of a complete 14 system review of symptoms, the patient complains only of the following symptoms, and all other reviewed systems are negative.  Palpitations of the heart Ringing in the ears Blurred  vision Eye pain, right Headache or numbness, dizziness Sleepiness  Blood pressure 115/75, pulse 64, height 5\' 6"  (1.676 m), weight 196 lb 8 oz (89.1 kg).  Physical Exam  General: The patient is alert and cooperative at the time of the examination.  Eyes: Pupils are equal, round, and reactive to light. Discs are flat bilaterally.  Neck: The neck is supple, no carotid bruits are noted.  Respiratory: The respiratory examination is clear.  Cardiovascular: The cardiovascular examination reveals a regular rate and rhythm, no obvious murmurs or rubs are noted.  Skin: Extremities are without significant edema.  Neurologic Exam  Mental status: The patient is alert and oriented x 3 at the time of the examination. The patient has apparent normal recent and remote memory, with an apparently normal attention span and concentration ability.  Cranial nerves: Facial symmetry is present. There is good sensation of the face to pinprick and soft touch bilaterally. The strength of the facial muscles and the muscles to head turning and shoulder shrug are normal bilaterally. Speech is well enunciated, no aphasia or dysarthria is noted. Extraocular movements are full. Visual fields are full. The tongue is midline, and the patient has symmetric elevation of the soft palate. No obvious hearing deficits are noted.  Motor: The motor testing reveals 5 over 5 strength of all 4 extremities. Good symmetric motor tone is noted throughout.  Sensory: Sensory testing is intact to pinprick, soft touch, vibration sensation, and position sense on all 4 extremities. No evidence of extinction is noted.  Coordination: Cerebellar testing reveals good finger-nose-finger and heel-to-shin bilaterally.  Gait and station: Gait is normal. Tandem gait is normal. Romberg is negative. No drift is seen.  Reflexes: Deep tendon reflexes are symmetric and normal bilaterally. Toes are downgoing bilaterally.   Assessment/Plan:  1.  Headache, migraine  The patient is having headache that is unusual for her, the headache may conform to migraine headache, but she is having focal symptoms with right-sided headache and left-sided numbness and paresthesias involving the arm. For this reason, MRI of the brain will be done. The patient was given a prednisone Dosepak, if the headache does not abate, we will add Topamax to the regimen. The patient will follow-up in 3 months, sooner if needed.  Jill Alexanders MD 12/19/2015 4:24 PM  [facility] [address] [place], [place] [address]  Phone [phone] Fax [phone]

## 2015-12-26 ENCOUNTER — Ambulatory Visit (INDEPENDENT_AMBULATORY_CARE_PROVIDER_SITE_OTHER): Payer: 59

## 2015-12-26 DIAGNOSIS — G4489 Other headache syndrome: Secondary | ICD-10-CM | POA: Diagnosis not present

## 2015-12-28 ENCOUNTER — Other Ambulatory Visit: Payer: Self-pay | Admitting: Endocrinology

## 2015-12-28 ENCOUNTER — Other Ambulatory Visit (INDEPENDENT_AMBULATORY_CARE_PROVIDER_SITE_OTHER): Payer: 59

## 2015-12-28 ENCOUNTER — Telehealth: Payer: Self-pay | Admitting: Neurology

## 2015-12-28 DIAGNOSIS — E559 Vitamin D deficiency, unspecified: Secondary | ICD-10-CM

## 2015-12-28 DIAGNOSIS — E890 Postprocedural hypothyroidism: Secondary | ICD-10-CM

## 2015-12-28 LAB — VITAMIN D 25 HYDROXY (VIT D DEFICIENCY, FRACTURES): VITD: 18.39 ng/mL — AB (ref 30.00–100.00)

## 2015-12-28 LAB — TSH: TSH: 2.16 u[IU]/mL (ref 0.35–4.50)

## 2015-12-28 MED ORDER — VITAMIN D (ERGOCALCIFEROL) 1.25 MG (50000 UNIT) PO CAPS: 50000.0000 [IU] | ORAL_CAPSULE | ORAL | 0 refills | Status: DC

## 2015-12-28 NOTE — Telephone Encounter (Signed)
  I called the patient. MRI the brain was normal. The patient is to contact our office if the headache remains a significant issue for her.  MRI brain 12/27/15:  IMPRESSION:  Unremarkable MRI brain (without). No acute findings.

## 2015-12-31 ENCOUNTER — Encounter: Payer: Self-pay | Admitting: Endocrinology

## 2016-01-01 NOTE — Telephone Encounter (Signed)
Saw that the pt should have 11 refills still at that [facility], I informed the pt of such she is going to call them to get it refilled.

## 2016-01-01 NOTE — Telephone Encounter (Signed)
Pt is in need of a refill on thyroid med to be called into [facility]

## 2016-01-09 ENCOUNTER — Encounter: Payer: Self-pay | Admitting: Neurology

## 2016-01-14 ENCOUNTER — Other Ambulatory Visit: Payer: Self-pay | Admitting: Endocrinology

## 2016-01-14 ENCOUNTER — Telehealth: Payer: Self-pay | Admitting: Endocrinology

## 2016-01-14 MED ORDER — LEVOTHYROXINE SODIUM 75 MCG PO TABS: 75.0000 ug | ORAL_TABLET | Freq: Every day | ORAL | 3 refills | Status: DC

## 2016-01-14 NOTE — Telephone Encounter (Signed)
please call patient: I have sent a prescription to your [facility], for the thyroid pill, 90 days at a time.

## 2016-01-15 NOTE — Telephone Encounter (Signed)
I contacted the patient and advised of message. Patient voiced understanding and had no further questions at this time.  

## 2016-01-26 DIAGNOSIS — J018 Other acute sinusitis: Secondary | ICD-10-CM | POA: Diagnosis not present

## 2016-01-31 DIAGNOSIS — J0140 Acute pansinusitis, unspecified: Secondary | ICD-10-CM | POA: Diagnosis not present

## 2016-05-23 ENCOUNTER — Encounter: Payer: Self-pay | Admitting: Medical

## 2016-05-23 ENCOUNTER — Ambulatory Visit (INDEPENDENT_AMBULATORY_CARE_PROVIDER_SITE_OTHER): Payer: 59 | Admitting: Medical

## 2016-05-23 ENCOUNTER — Encounter: Payer: Self-pay | Admitting: Family

## 2016-05-23 VITALS — BP 121/70 | HR 60 | Temp 98.2°F | Resp 16 | Ht 66.0 in | Wt 206.0 lb

## 2016-05-23 DIAGNOSIS — R51 Headache: Secondary | ICD-10-CM | POA: Diagnosis not present

## 2016-05-23 DIAGNOSIS — R519 Headache, unspecified: Secondary | ICD-10-CM

## 2016-05-23 MED ORDER — KETOROLAC TROMETHAMINE 60 MG/2ML IM SOLN
60.0000 mg | Freq: Once | INTRAMUSCULAR | Status: AC
  Administered 2016-05-23: 60 mg via INTRAMUSCULAR

## 2016-05-23 MED ORDER — BUTALBITAL-ASA-CAFF-CODEINE 50-325-40-30 MG PO CAPS: 1.0000 | ORAL_CAPSULE | Freq: Four times a day (QID) | ORAL | 0 refills | Status: DC | PRN

## 2016-05-23 NOTE — Patient Instructions (Addendum)
For headache today in office gave toradol 60 mg im.  In the event you have no decrease in pain or worse symptoms as discussed then ED evaluation.  Rx fiorinal with codeine for recurrent ha in future. If not responding or more severe type ha/migraine like then consider topamax as Dr. Jannifer Franklin had considered that on his note review.  Follow up 2-3 month for wellness exam. But sooner if heachache not resolving/ not controlled.

## 2016-05-23 NOTE — Telephone Encounter (Signed)
Pt seeing Percell Miller today.

## 2016-05-23 NOTE — Progress Notes (Signed)
Pre visit review using our clinic review tool, if applicable. No additional management support is needed unless otherwise documented below in the visit note. 

## 2016-05-23 NOTE — Progress Notes (Signed)
Subjective:    Patient ID: Karen Merritt, female    DOB: April 03, 1970, 46 y.o.   MRN: [id number]  HPI  Pt in with some ha in region  left side back of her head area. Pt states she had HA in this area in past. Last November in past we gave toradol and HA resolved for months. Ha did not reoccur until yesterday. Pt states recently stressed over the weeks repairing her house.   HA  not as bad as November. Currently low level 1-2/10. But at times her pain will be high level intense 7-8/10. No nausea, no vomiting, no gross motor or sensory function deficits.   Dr. Jannifer Franklin neurologist thought may have been migraine like. Pt was given prednsine dose back to take if needed.Dr. Jannifer Franklin considered topamax.(she did not take either medication)   MRI that was done looked good./no acute finding. Normal.  On review pt tells me when she has ha in the past she never has light sensitivity.  She request toradol to stop ha since worked very well last time.   Review of Systems  Constitutional: Negative for chills, fatigue and fever.  Eyes: Negative for photophobia, pain, redness, itching and visual disturbance.  Respiratory: Negative for cough, chest tightness and shortness of breath.   Cardiovascular: Negative for chest pain and palpitations.  Gastrointestinal: Negative for abdominal pain.  Musculoskeletal: Negative for back pain and neck pain.  Neurological: Positive for headaches. Negative for dizziness, syncope, speech difficulty, weakness and numbness.  Hematological: Negative for adenopathy. Does not bruise/bleed easily.  Psychiatric/Behavioral: Negative for confusion.   Past Medical History:  Diagnosis Date  . Chest pain    a. non-cardiac 2005, 2012.  Marland Kitchen Hemorrhoids 11-21-2009   colonoscopy  . 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 Hypothyroidism    a. s/p thyroidectomy ~ 1998  . Palpitations      Social History   Social History  . Marital status: Divorced    Spouse name:  N/A  . Number of children: 2  . Years of education: 14   Occupational History  . [place] History Main Topics  . Smoking status: Never Smoker  . Smokeless tobacco: Never Used  . Alcohol use No  . Drug use: No  . Sexual activity: Not on file   Other Topics Concern  . Not on file   Social History Narrative   Lives in [place].  Active, walks/jogs.  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 bone spur  6/11   right big toe  . SHOULDER ARTHROSCOPY WITH SUBACROMIAL DECOMPRESSION AND OPEN ROTATOR C Right 05/23/2013   Procedure: RIGHT SHOULDER ARTHROSCOPY WITH SUBACROMIAL DECOMPRESSION AND MINI OPEN ROTATOR CUFF REPAIR, AND DISTAL CLAVICLE RESECTION;  Surgeon: Augustin Schooling, MD;  Location: [facility];  Service: Orthopedics;  Laterality: Right;  . stress cardiolite  05/23/03  . THYROIDECTOMY  12/29/96  . TOTAL ABDOMINAL HYSTERECTOMY  2008    Family History  Problem Relation Age of Onset  . Hypertension Mother   . Colon polyps Mother   . Coronary artery disease Mother     alive @ 14.  Marland Kitchen Heart disease Mother 35    10/20/13  . Stroke Mother   . Other Father     alive and well @ 96.  Marland Kitchen Hypertension Sister   . Migraines Brother   . Migraines  Sister   . Bone cancer Maternal Aunt   . Thyroid disease Maternal Aunt   . Liver cancer Maternal Aunt   . Hypertension Maternal Grandmother   . Diabetes Maternal Grandmother   . Coronary artery disease Maternal Grandfather   . Coronary artery disease Maternal Uncle   . Hypertension Maternal Uncle   . Heart disease Maternal Aunt     No Known Allergies  Current Outpatient Prescriptions on File Prior to Visit  Medication Sig Dispense Refill  . dibucaine (NUPERCAINAL) 1 % ointment Apply topically 3 (three) times daily as needed for pain. 30 g 0  . ibuprofen (ADVIL,MOTRIN) 200 MG tablet Take 800 mg by mouth every 6 (six)  hours as needed.    Marland Kitchen levothyroxine (SYNTHROID, LEVOTHROID) 75 MCG tablet Take 1 tablet (75 mcg total) by mouth daily before breakfast. 90 tablet 3  . Melatonin 3 MG TABS Take 3 mg by mouth at bedtime as needed (sleep).     . SUMAtriptan (IMITREX) 50 MG tablet Take 1 tablet at the start of the headache, may repeat in 2 hours if not resolved. 10 tablet 0   No current facility-administered medications on file prior to visit.     BP 121/70 (BP Location: Right Arm, Patient Position: Sitting, Cuff Size: Normal)   Pulse 60   Temp 98.2 F (36.8 C) (Oral)   Resp 16   Ht 5\' 6"  (1.676 m)   Wt 206 lb (93.4 kg)   SpO2 98%   BMI 33.25 kg/m       Objective:   Physical Exam  General Mental Status- Alert. General Appearance- Not in acute distress.   Skin General: Color- Normal Color. Moisture- Normal Moisture.  Neck Carotid Arteries- Normal color. Moisture- Normal Moisture. No carotid bruits. No JVD. (late in exam she pointed to area where she feels intermittent pain. Appeared to be area where trapezius inserts into occiptal area. This area is point tender(but some desribed mild left sided occiptial area pain as well)   Chest and Lung Exam Auscultation: Breath Sounds:-Normal.  Cardiovascular Auscultation:Rythm- Regular. Murmurs & Other Heart Sounds:Auscultation of the heart reveals- No Murmurs.  Abdomen Inspection:-Inspeection Normal. Palpation/Percussion:Note:No mass. Palpation and Percussion of the abdomen reveal- Non Tender, Non Distended + BS, no rebound or guarding.   Neurologic Cranial Nerve exam:- CN III-XII intact(No nystagmus), symmetric smile. Drift Test:- No drift. Finger to Nose:- Normal/Intact Strength:- 5/5 equal and symmetric strength both upper and lower extremities.     Assessment & Plan:  For headache today in office gave toradol 60 mg im.  In the event you have no decrease in pain or worse symptoms as discussed then ED evaluation.  Rx fiorinal with codeine  for recurrent ha in future. If not responding or more severe type ha/migraine like then consider topamax as Dr. Jannifer Franklin had considered that on his note review.  Follow up 2-3 month for wellness exam. But sooner if heachache not resolving/ not controlled.  Mavryk Pino, Percell Miller, PA-C

## 2016-06-25 ENCOUNTER — Other Ambulatory Visit: Payer: Self-pay | Admitting: Endocrinology

## 2016-06-25 DIAGNOSIS — E559 Vitamin D deficiency, unspecified: Secondary | ICD-10-CM

## 2016-06-25 DIAGNOSIS — E890 Postprocedural hypothyroidism: Secondary | ICD-10-CM

## 2016-07-01 ENCOUNTER — Other Ambulatory Visit: Payer: Self-pay | Admitting: Endocrinology

## 2016-07-01 ENCOUNTER — Encounter: Payer: Self-pay | Admitting: Endocrinology

## 2016-07-01 ENCOUNTER — Other Ambulatory Visit (INDEPENDENT_AMBULATORY_CARE_PROVIDER_SITE_OTHER): Payer: 59

## 2016-07-01 DIAGNOSIS — E559 Vitamin D deficiency, unspecified: Secondary | ICD-10-CM | POA: Diagnosis not present

## 2016-07-01 DIAGNOSIS — E890 Postprocedural hypothyroidism: Secondary | ICD-10-CM | POA: Diagnosis not present

## 2016-07-01 LAB — TSH: TSH: 8.63 u[IU]/mL — ABNORMAL HIGH (ref 0.35–4.50)

## 2016-07-01 LAB — VITAMIN D 25 HYDROXY (VIT D DEFICIENCY, FRACTURES): VITD: 18.81 ng/mL — AB (ref 30.00–100.00)

## 2016-07-01 MED ORDER — VITAMIN D (ERGOCALCIFEROL) 1.25 MG (50000 UNIT) PO CAPS: 50000.0000 [IU] | ORAL_CAPSULE | ORAL | 0 refills | Status: AC

## 2016-07-01 MED ORDER — LEVOTHYROXINE SODIUM 100 MCG PO TABS: 100.0000 ug | ORAL_TABLET | Freq: Every day | ORAL | 3 refills | Status: DC

## 2016-07-02 ENCOUNTER — Encounter: Payer: Self-pay | Admitting: Endocrinology

## 2016-07-02 LAB — PTH, INTACT AND CALCIUM
CALCIUM: 9.9 mg/dL (ref 8.6–10.2)
PTH: 45 pg/mL (ref 14–64)

## 2016-07-10 ENCOUNTER — Encounter: Payer: Self-pay | Admitting: Medical

## 2016-07-24 ENCOUNTER — Encounter: Payer: Self-pay | Admitting: Medical

## 2016-07-24 ENCOUNTER — Ambulatory Visit (INDEPENDENT_AMBULATORY_CARE_PROVIDER_SITE_OTHER): Payer: 59 | Admitting: Medical

## 2016-07-24 ENCOUNTER — Ambulatory Visit (HOSPITAL_BASED_OUTPATIENT_CLINIC_OR_DEPARTMENT_OTHER)
Admission: RE | Admit: 2016-07-24 | Discharge: 2016-07-24 | Disposition: A | Discharge status: Home / Self Care | Payer: 59 | Source: Ambulatory Visit | Attending: Medical | Admitting: Medical

## 2016-07-24 VITALS — BP 120/71 | HR 69 | Temp 98.0°F | Resp 16 | Ht 66.0 in | Wt 212.6 lb

## 2016-07-24 DIAGNOSIS — M7989 Other specified soft tissue disorders: Secondary | ICD-10-CM | POA: Diagnosis not present

## 2016-07-24 DIAGNOSIS — M25571 Pain in right ankle and joints of right foot: Secondary | ICD-10-CM

## 2016-07-24 DIAGNOSIS — R600 Localized edema: Secondary | ICD-10-CM

## 2016-07-24 DIAGNOSIS — R635 Abnormal weight gain: Secondary | ICD-10-CM

## 2016-07-24 DIAGNOSIS — R0600 Dyspnea, unspecified: Secondary | ICD-10-CM

## 2016-07-24 DIAGNOSIS — M79661 Pain in right lower leg: Secondary | ICD-10-CM

## 2016-07-24 DIAGNOSIS — M79671 Pain in right foot: Secondary | ICD-10-CM

## 2016-07-24 DIAGNOSIS — R002 Palpitations: Secondary | ICD-10-CM | POA: Diagnosis not present

## 2016-07-24 LAB — URIC ACID: Uric Acid, Serum: 4.4 mg/dL (ref 2.4–7.0)

## 2016-07-24 LAB — COMPREHENSIVE METABOLIC PANEL
ALT: 29 U/L (ref 0–35)
AST: 21 U/L (ref 0–37)
Albumin: 3.9 g/dL (ref 3.5–5.2)
Alkaline Phosphatase: 65 U/L (ref 39–117)
BUN: 13 mg/dL (ref 6–23)
CHLORIDE: 104 meq/L (ref 96–112)
CO2: 28 mEq/L (ref 19–32)
CREATININE: 0.7 mg/dL (ref 0.40–1.20)
Calcium: 9.2 mg/dL (ref 8.4–10.5)
GFR: 94.85 mL/min (ref >60.00)
GLUCOSE: 117 mg/dL — AB (ref 70–99)
POTASSIUM: 3.9 meq/L (ref 3.5–5.1)
SODIUM: 137 meq/L (ref 135–145)
Total Bilirubin: 0.4 mg/dL (ref 0.2–1.2)
Total Protein: 6.9 g/dL (ref 6.0–8.3)

## 2016-07-24 LAB — BRAIN NATRIURETIC PEPTIDE: PRO B NATRI PEPTIDE: 20 pg/mL (ref 0.0–100.0)

## 2016-07-24 NOTE — Progress Notes (Signed)
Subjective:    Patient ID: Karen Merritt, female    DOB: 04/29/1970, 46 y.o.   MRN: [id number]  HPI  Pt in for rt foot swelling for about one month. The swelling is mostly in rt foot. Pt states swollen despite compression stocking and elevation of leg. Pt states in morning the swelling is not decreased. Pt states some pain when she walks. No fall or injury.   No reported calf pain intially but some on exam.  Today little less than usual.   Some mild weight gain recently. Some mild sob when going up steps.   Review of Systems  Constitutional: Negative for chills, fatigue and fever.  HENT: Negative for congestion, ear discharge, sinus pain and sinus pressure.   Respiratory: Positive for shortness of breath. Negative for chest tightness and wheezing.        Did not remember until I asked but some climbing stairs.  Cardiovascular: Negative for chest pain and palpitations.  Gastrointestinal: Negative for abdominal pain.  Genitourinary: Negative for difficulty urinating, dysuria, genital sores, hematuria and pelvic pain.  Musculoskeletal: Negative for back pain, myalgias and neck stiffness.       Rt side faint calf pain on exam.  Skin: Negative for rash.  Neurological: Negative for dizziness, speech difficulty, weakness and light-headedness.  Hematological: Negative for adenopathy. Does not bruise/bleed easily.  Psychiatric/Behavioral: Negative for behavioral problems, confusion and suicidal ideas. The patient is not nervous/anxious.     Past Medical History:  Diagnosis Date  . Chest pain    a. non-cardiac 2005, 2012.  Marland Kitchen Hemorrhoids 11-21-2009   colonoscopy  . 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 Hypothyroidism    a. s/p thyroidectomy ~ 1998  . Palpitations      Social History   Social History  . Marital status: Divorced    Spouse name: N/A  . Number of children: 2  . Years of education: 14   Occupational History  . [place] History Main Topics  . Smoking status: Never Smoker  . Smokeless tobacco: Never Used  . Alcohol use No  . Drug use: No  . Sexual activity: Not on file   Other Topics Concern  . Not on file   Social History Narrative   Lives in [place].  Active, walks/jogs.  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 bone spur  6/11   right big toe  . SHOULDER ARTHROSCOPY WITH SUBACROMIAL DECOMPRESSION AND OPEN ROTATOR C Right 05/23/2013   Procedure: RIGHT SHOULDER ARTHROSCOPY WITH SUBACROMIAL DECOMPRESSION AND MINI OPEN ROTATOR CUFF REPAIR, AND DISTAL CLAVICLE RESECTION;  Surgeon: Augustin Schooling, MD;  Location: [facility];  Service: Orthopedics;  Laterality: Right;  . stress cardiolite  05/23/03  . THYROIDECTOMY  12/29/96  . TOTAL ABDOMINAL HYSTERECTOMY  2008    Family History  Problem Relation Age of Onset  . Hypertension Mother   . Colon polyps Mother   . Coronary artery disease Mother        alive @ 28.  Marland Kitchen Heart disease Mother 16       10/20/13  . Stroke Mother   . Other Father        alive and well @ 50.  Marland Kitchen Hypertension Sister   . Migraines Brother   . Migraines Sister   . Bone cancer Maternal Aunt   . Thyroid  disease Maternal Aunt   . Liver cancer Maternal Aunt   . Hypertension Maternal Grandmother   . Diabetes Maternal Grandmother   . Coronary artery disease Maternal Grandfather   . Coronary artery disease Maternal Uncle   . Hypertension Maternal Uncle   . Heart disease Maternal Aunt     No Known Allergies  Current Outpatient Prescriptions on File Prior to Visit  Medication Sig Dispense Refill  . butalbital-aspirin-caffeine-codeine (FIORINAL WITH CODEINE) 50-325-40-30 MG capsule Take 1 capsule by mouth every 6 (six) hours as needed for pain, headache or migraine. 16 capsule 0  . dibucaine (NUPERCAINAL) 1 % ointment Apply topically 3 (three) times daily as needed for pain. 30 g 0   . ibuprofen (ADVIL,MOTRIN) 200 MG tablet Take 800 mg by mouth every 6 (six) hours as needed.    Marland Kitchen levothyroxine (SYNTHROID, LEVOTHROID) 100 MCG tablet Take 1 tablet (100 mcg total) by mouth daily. 90 tablet 3  . Melatonin 3 MG TABS Take 3 mg by mouth at bedtime as needed (sleep).     . SUMAtriptan (IMITREX) 50 MG tablet Take 1 tablet at the start of the headache, may repeat in 2 hours if not resolved. 10 tablet 0  . Vitamin D, Ergocalciferol, (DRISDOL) 50000 units CAPS capsule Take 1 capsule (50,000 Units total) by mouth 3 (three) times a week. 12 capsule 0   No current facility-administered medications on file prior to visit.     BP 120/71 (BP Location: Right Arm, Patient Position: Sitting, Cuff Size: Normal)   Pulse 69   Temp 98 F (36.7 C) (Oral)   Resp 16   Ht 5\' 6"  (1.676 m)   Wt 212 lb 9.6 oz (96.4 kg)   SpO2 98%   BMI 34.31 kg/m       Objective:   Physical Exam   General Mental Status- Alert. General Appearance- Not in acute distress.   Skin General: Color- Normal Color. Moisture- Normal Moisture.  Neck Carotid Arteries- Normal color. Moisture- Normal Moisture. No carotid bruits. No JVD.  Chest and Lung Exam Auscultation: Breath Sounds:-Normal.  Cardiovascular Auscultation:Rythm- Regular. Murmurs & Other Heart Sounds:Auscultation of the heart reveals- No Murmurs.  Abdomen Inspection:-Inspeection Normal. Palpation/Percussion:Note:No mass. Palpation and Percussion of the abdomen reveal- Non Tender, Non Distended + BS, no rebound or guarding.    Neurologic Cranial Nerve exam:- CN III-XII intact(No nystagmus), symmetric smile. Strength:- 5/5 equal and symmetric strength both upper and lower extremities.  Lower ext- faint positive homans sign. Slight fullness to popliteal fossa.  Rt ankle- faint tenderness.  Rt foot- distal foot moderate swollen. Not warm and faint tender.     Assessment & Plan:  For your sob and pedal edema bnp, cmp and cxr.  For  calf pain will get lower ext [place].  For rt foot and ankle pain xrays of both.(also get uric acid)  Continue current elevation of legs pending work up results.(and compression stockings)  Follow up date to be determined after lab review.

## 2016-07-24 NOTE — Patient Instructions (Addendum)
For your sob and pedal edema bnp, cmp and cxr.  For calf pain will get lower ext [place].  For rt foot and ankle pain xrays of both.(also get uric acid)  Continue current elevation of legs pending work up results.(and compression stocking)  Follow up date to be determined after lab review.

## 2016-07-25 ENCOUNTER — Telehealth: Payer: Self-pay | Admitting: Medical

## 2016-07-25 ENCOUNTER — Ambulatory Visit (INDEPENDENT_AMBULATORY_CARE_PROVIDER_SITE_OTHER): Payer: 59 | Admitting: Medical

## 2016-07-25 DIAGNOSIS — R600 Localized edema: Secondary | ICD-10-CM

## 2016-07-25 NOTE — Telephone Encounter (Signed)
Pt return call to get her lab results. Please advise.

## 2016-07-25 NOTE — Telephone Encounter (Signed)
Pt has appointment today.  

## 2016-07-26 NOTE — Progress Notes (Signed)
   Subjective:    Patient ID: Karen Merritt, female    DOB: 1970-11-25, 46 y.o.   MRN: [id number]  HPI No official visit/charge. Saw her the day before did work up for rt swollen foot. Work up was negative. Wanted her to come in to wrap the foot with small ace bandage. Very briefly did. Asked her to use during the day. Take of ace wrap at night. Replace in the morning. No charge.   Review of Systems     Objective:   Physical Exam        Assessment & Plan:

## 2016-09-17 ENCOUNTER — Ambulatory Visit (INDEPENDENT_AMBULATORY_CARE_PROVIDER_SITE_OTHER): Payer: 59 | Admitting: Medical

## 2016-09-17 ENCOUNTER — Encounter: Payer: Self-pay | Admitting: Medical

## 2016-09-17 VITALS — BP 101/63 | HR 56 | Temp 98.0°F | Resp 16 | Ht 66.0 in | Wt 208.8 lb

## 2016-09-17 DIAGNOSIS — M79671 Pain in right foot: Secondary | ICD-10-CM | POA: Diagnosis not present

## 2016-09-17 DIAGNOSIS — M7989 Other specified soft tissue disorders: Secondary | ICD-10-CM | POA: Diagnosis not present

## 2016-09-17 NOTE — Patient Instructions (Addendum)
For your rt foot swelling and pain I am trying to get you scheduled with podiatrist this week. Staff is working on referral and hope to have them call you be tomorrow am. If no one calls you tomorrow morning then call here and ask to speak with Anderson Malta or [place].  Pending the referral continue the ace wrap/compression and ibuprofen. Follow up with me as regularly scheduled or as needed

## 2016-09-17 NOTE — Progress Notes (Signed)
Subjective:    Patient ID: Karen Merritt, female    DOB: Oct 21, 1970, 46 y.o.   MRN: [id number]  HPI  Pt in with persistent swelling of her rt foot for 6 weeks.. Pt states little worse than before. Last 2 weeks she has some throbbing.   On first visit did work up to make sure no systemic cause of pedal edema, bnp, cmp, cxr  And uric acid. All were normal.  Xray of the foot was normal and [place] of lower ext rt side was normal.  Pt did ace compression wraps. It helps briefly but then recoccurs without wrap.   Elevating leg does not decrease swelling.   Overall swelling for 6 wks or more. She wonders if needs mri.     Review of Systems  Constitutional: Negative for chills and fatigue.  HENT: Negative for congestion, ear discharge and ear pain.   Respiratory: Negative for cough, chest tightness, shortness of breath and wheezing.   Cardiovascular: Negative for chest pain and palpitations.  Gastrointestinal: Negative for abdominal pain, constipation, nausea and vomiting.  Musculoskeletal: Negative for gait problem.       Rt foot swelling. Faint pain. No warmth.  Skin: Negative for pallor and rash.  Neurological: Negative for dizziness, syncope, speech difficulty, weakness, numbness and headaches.  Hematological: Negative for adenopathy. Does not bruise/bleed easily.  Psychiatric/Behavioral: Negative for behavioral problems, confusion and sleep disturbance. The patient is not nervous/anxious.    Past Medical History:  Diagnosis Date  . Chest pain    a. non-cardiac 2005, 2012.  Marland Kitchen Hemorrhoids 11-21-2009   colonoscopy  . 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 Hypothyroidism    a. s/p thyroidectomy ~ 1998  . Palpitations      Social History   Social History  . Marital status: Divorced    Spouse name: N/A  . Number of children: 2  . Years of education: 14   Occupational History  . [place] History Main Topics  . Smoking status:  Never Smoker  . Smokeless tobacco: Never Used  . Alcohol use No  . Drug use: No  . Sexual activity: Not on file   Other Topics Concern  . Not on file   Social History Narrative   Lives in [place].  Active, walks/jogs.  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 bone spur  6/11   right big toe  . SHOULDER ARTHROSCOPY WITH SUBACROMIAL DECOMPRESSION AND OPEN ROTATOR C Right 05/23/2013   Procedure: RIGHT SHOULDER ARTHROSCOPY WITH SUBACROMIAL DECOMPRESSION AND MINI OPEN ROTATOR CUFF REPAIR, AND DISTAL CLAVICLE RESECTION;  Surgeon: Augustin Schooling, MD;  Location: [facility];  Service: Orthopedics;  Laterality: Right;  . stress cardiolite  05/23/03  . THYROIDECTOMY  12/29/96  . TOTAL ABDOMINAL HYSTERECTOMY  2008    Family History  Problem Relation Age of Onset  . Hypertension Mother   . Colon polyps Mother   . Coronary artery disease Mother        alive @ 86.  Marland Kitchen Heart disease Mother 60       10/20/13  . Stroke Mother   . Other Father        alive and well @ 26.  Marland Kitchen Hypertension Sister   . Migraines Brother   . Migraines Sister   . Bone cancer Maternal Aunt   . Thyroid disease Maternal  Aunt   . Liver cancer Maternal Aunt   . Hypertension Maternal Grandmother   . Diabetes Maternal Grandmother   . Coronary artery disease Maternal Grandfather   . Coronary artery disease Maternal Uncle   . Hypertension Maternal Uncle   . Heart disease Maternal Aunt     No Known Allergies  Current Outpatient Prescriptions on File Prior to Visit  Medication Sig Dispense Refill  . butalbital-aspirin-caffeine-codeine (FIORINAL WITH CODEINE) 50-325-40-30 MG capsule Take 1 capsule by mouth every 6 (six) hours as needed for pain, headache or migraine. 16 capsule 0  . dibucaine (NUPERCAINAL) 1 % ointment Apply topically 3 (three) times daily as needed for pain. 30 g 0  . ibuprofen (ADVIL,MOTRIN) 200 MG tablet Take 800  mg by mouth every 6 (six) hours as needed.    Marland Kitchen levothyroxine (SYNTHROID, LEVOTHROID) 100 MCG tablet Take 1 tablet (100 mcg total) by mouth daily. 90 tablet 3  . Melatonin 3 MG TABS Take 3 mg by mouth at bedtime as needed (sleep).     . SUMAtriptan (IMITREX) 50 MG tablet Take 1 tablet at the start of the headache, may repeat in 2 hours if not resolved. 10 tablet 0   No current facility-administered medications on file prior to visit.     BP 101/63   Pulse (!) 56   Temp 98 F (36.7 C) (Oral)   Resp 16   Ht 5\' 6"  (1.676 m)   Wt 208 lb 12.8 oz (94.7 kg)   SpO2 100%   BMI 33.70 kg/m       Objective:   Physical Exam  General- No acute distress. Pleasant patient. Neck- Full range of motion, no jvd Lungs- Clear, even and unlabored. Heart- regular rate and rhythm. Neurologic- CNII- XII grossly intact.  Rt lower ext- negative homans sign. No pretibial edema.  Rt foot- top aspect moderate swelling. Faint pain on palpaiton. Not warm. Left foot- no pain on palpation.     Assessment & Plan:  For your rt foot swelling and pain I am trying to get you scheduled with podiatrist this week. Staff is working on referral and hope to have them call you be tomorrow am. If no one calls you tomorrow morning then call here and ask to speak with Anderson Malta or [place].  Pending the referral continue the ace wrap/compression and ibuprofen. Follow up with me as regularly scheduled or as needed  Signs and symptoms don't appear to be infection related.  Francine Hannan, Percell Miller, PA-C

## 2016-09-19 DIAGNOSIS — R601 Generalized edema: Secondary | ICD-10-CM | POA: Diagnosis not present

## 2016-09-19 DIAGNOSIS — M25571 Pain in right ankle and joints of right foot: Secondary | ICD-10-CM | POA: Diagnosis not present

## 2016-10-07 ENCOUNTER — Telehealth: Payer: Self-pay | Admitting: Endocrinology

## 2016-10-07 DIAGNOSIS — E890 Postprocedural hypothyroidism: Secondary | ICD-10-CM

## 2016-10-07 NOTE — Telephone Encounter (Signed)
Scheduled labs 09/25 at 8:45am.   Please put orders in the system. Patient says it's to check thyroid levels and she usually fasts.  Ty,  -LL

## 2016-10-08 ENCOUNTER — Other Ambulatory Visit: Payer: Self-pay

## 2016-10-08 NOTE — Progress Notes (Unsigned)
tsh

## 2016-10-08 NOTE — Telephone Encounter (Signed)
Hypothyroidism.  I put in the labs, thanks.

## 2016-10-08 NOTE — Telephone Encounter (Signed)
Ok, please do TSH and free T4.  Thank you.

## 2016-10-14 ENCOUNTER — Other Ambulatory Visit (INDEPENDENT_AMBULATORY_CARE_PROVIDER_SITE_OTHER): Payer: 59

## 2016-10-14 DIAGNOSIS — E890 Postprocedural hypothyroidism: Secondary | ICD-10-CM

## 2016-10-14 LAB — T4, FREE: FREE T4: 0.81 ng/dL (ref 0.60–1.60)

## 2016-10-14 LAB — TSH: TSH: 1.9 u[IU]/mL (ref 0.35–4.50)

## 2016-12-08 DIAGNOSIS — J018 Other acute sinusitis: Secondary | ICD-10-CM | POA: Diagnosis not present

## 2016-12-12 DIAGNOSIS — R05 Cough: Secondary | ICD-10-CM | POA: Diagnosis not present

## 2016-12-12 DIAGNOSIS — J018 Other acute sinusitis: Secondary | ICD-10-CM | POA: Diagnosis not present

## 2017-03-18 ENCOUNTER — Telehealth: Payer: Self-pay | Admitting: Endocrinology

## 2017-03-18 NOTE — Telephone Encounter (Signed)
Pt needs new RX  For Synthroid (Levothyroxine) sent to [facility] on [address] (needs to transfer this med due to [facility] does not have Synthroid).  [place] told pt that she needed to get new labs done because different manufacturers have different ingredients which may not suit the patient's medical needs.

## 2017-03-19 ENCOUNTER — Other Ambulatory Visit: Payer: Self-pay

## 2017-03-19 MED ORDER — LEVOTHYROXINE SODIUM 100 MCG PO TABS: 100.0000 ug | ORAL_TABLET | Freq: Every day | ORAL | 0 refills | Status: DC

## 2017-03-19 NOTE — Telephone Encounter (Signed)
Patient has not been seen since 10/29/15 and has no future appointment scheduled- should this be refilled or refused please advise

## 2017-03-19 NOTE — Telephone Encounter (Signed)
Done

## 2017-03-19 NOTE — Telephone Encounter (Signed)
Please refill x 1 Ov is due  

## 2017-03-25 ENCOUNTER — Other Ambulatory Visit: Payer: Self-pay

## 2017-03-25 ENCOUNTER — Telehealth: Payer: Self-pay | Admitting: Endocrinology

## 2017-03-25 MED ORDER — LEVOTHYROXINE SODIUM 100 MCG PO TABS: 100.0000 ug | ORAL_TABLET | Freq: Every day | ORAL | 0 refills | Status: DC

## 2017-03-25 NOTE — Telephone Encounter (Signed)
I have sent to [facility].

## 2017-03-25 NOTE — Telephone Encounter (Signed)
Patient's pharmacy is [facility]. Please send Rx for Synthroid (Levothyroxine 100 mg) to [facility] asap -out of medication

## 2017-03-26 ENCOUNTER — Other Ambulatory Visit: Payer: Self-pay

## 2017-03-26 MED ORDER — LEVOTHYROXINE SODIUM 100 MCG PO TABS: 100.0000 ug | ORAL_TABLET | Freq: Every day | ORAL | 0 refills | Status: DC

## 2017-03-27 ENCOUNTER — Telehealth: Payer: Self-pay | Admitting: Endocrinology

## 2017-03-27 ENCOUNTER — Other Ambulatory Visit: Payer: Self-pay

## 2017-03-27 MED ORDER — LEVOTHYROXINE SODIUM 100 MCG PO TABS: 100.0000 ug | ORAL_TABLET | Freq: Every day | ORAL | 0 refills | Status: DC

## 2017-03-27 MED FILL — LEVOTHYROXINE 100 MCG TABLE: 100 | 30 days supply | Qty: 30 | Fill #0

## 2017-03-27 NOTE — Telephone Encounter (Signed)
°  levothyroxine (SYNTHROID, LEVOTHROID) 100 MCG tablet  Patient is stating that this was just filled with [facility] outpatient yesterday. She is needing a 30 day supply of this. But isnt sure if [facility] will fill this for her  Please advise

## 2017-03-27 NOTE — Telephone Encounter (Signed)
I sent 30 day supply to Karen Merritt

## 2017-03-30 NOTE — Telephone Encounter (Signed)
error 

## 2017-04-03 ENCOUNTER — Other Ambulatory Visit: Payer: Self-pay

## 2017-04-03 ENCOUNTER — Telehealth: Payer: Self-pay | Admitting: Endocrinology

## 2017-04-03 MED ORDER — LEVOTHYROXINE SODIUM 100 MCG PO TABS: 100.0000 ug | ORAL_TABLET | Freq: Every day | ORAL | 0 refills | Status: DC

## 2017-04-03 NOTE — Telephone Encounter (Signed)
I have corrected & sent.

## 2017-04-03 NOTE — Telephone Encounter (Signed)
Patient needs RX for Synthroid-90 day supply (it has to be 90 days-it was previously sent for 30 days) sent to [facility]

## 2017-07-29 ENCOUNTER — Other Ambulatory Visit: Payer: Self-pay | Admitting: Endocrinology

## 2017-07-30 NOTE — Telephone Encounter (Signed)
Last ov 10/17 and no future scheduled refill or refuse please advise

## 2017-07-30 NOTE — Telephone Encounter (Signed)
Please refill x 1 Ov is due  

## 2017-08-24 ENCOUNTER — Ambulatory Visit
Admission: RE | Admit: 2017-08-24 | Discharge: 2017-08-24 | Disposition: A | Discharge status: Home / Self Care | Payer: 59 | Source: Ambulatory Visit | Attending: Family Medicine | Admitting: Family Medicine

## 2017-08-24 ENCOUNTER — Ambulatory Visit (HOSPITAL_BASED_OUTPATIENT_CLINIC_OR_DEPARTMENT_OTHER)
Admission: RE | Admit: 2017-08-24 | Discharge: 2017-08-24 | Disposition: A | Discharge status: Home / Self Care | Payer: 59 | Source: Ambulatory Visit | Attending: Family Medicine | Admitting: Family Medicine

## 2017-08-24 ENCOUNTER — Ambulatory Visit (HOSPITAL_COMMUNITY)
Admission: EM | Admit: 2017-08-24 | Discharge: 2017-08-24 | Disposition: A | Discharge status: Home / Self Care | Payer: 59 | Attending: Family Medicine | Admitting: Family Medicine

## 2017-08-24 ENCOUNTER — Encounter: Payer: Self-pay | Admitting: Family Medicine

## 2017-08-24 ENCOUNTER — Ambulatory Visit (INDEPENDENT_AMBULATORY_CARE_PROVIDER_SITE_OTHER): Payer: 59 | Admitting: Family Medicine

## 2017-08-24 ENCOUNTER — Encounter (HOSPITAL_COMMUNITY): Payer: Self-pay | Admitting: Emergency Medicine

## 2017-08-24 ENCOUNTER — Other Ambulatory Visit: Payer: Self-pay | Admitting: Family Medicine

## 2017-08-24 VITALS — BP 130/72 | HR 87 | Resp 16 | Ht 66.0 in | Wt 208.2 lb

## 2017-08-24 DIAGNOSIS — G4452 New daily persistent headache (NDPH): Secondary | ICD-10-CM

## 2017-08-24 DIAGNOSIS — Z130 Encounter for screening for diseases of the blood and blood-forming organs and certain disorders involving the immune mechanism: Secondary | ICD-10-CM | POA: Diagnosis not present

## 2017-08-24 DIAGNOSIS — R51 Headache: Secondary | ICD-10-CM

## 2017-08-24 DIAGNOSIS — R519 Headache, unspecified: Secondary | ICD-10-CM

## 2017-08-24 DIAGNOSIS — E890 Postprocedural hypothyroidism: Secondary | ICD-10-CM

## 2017-08-24 DIAGNOSIS — Z131 Encounter for screening for diabetes mellitus: Secondary | ICD-10-CM | POA: Diagnosis not present

## 2017-08-24 DIAGNOSIS — G44201 Tension-type headache, unspecified, intractable: Secondary | ICD-10-CM | POA: Diagnosis not present

## 2017-08-24 LAB — CBC
HEMATOCRIT: 40 % (ref 36.0–46.0)
Hemoglobin: 13.9 g/dL (ref 12.0–15.0)
MCHC: 34.8 g/dL (ref 30.0–36.0)
MCV: 81.8 fl (ref 78.0–100.0)
PLATELETS: 224 10*3/uL (ref 150.0–400.0)
RBC: 4.88 Mil/uL (ref 3.87–5.11)
RDW: 13.9 % (ref 11.5–15.5)
WBC: 6.7 10*3/uL (ref 4.0–10.5)

## 2017-08-24 LAB — COMPREHENSIVE METABOLIC PANEL
ALT: 20 U/L (ref 0–35)
AST: 15 U/L (ref 0–37)
Albumin: 4 g/dL (ref 3.5–5.2)
Alkaline Phosphatase: 65 U/L (ref 39–117)
BILIRUBIN TOTAL: 0.6 mg/dL (ref 0.2–1.2)
BUN: 10 mg/dL (ref 6–23)
CALCIUM: 9.8 mg/dL (ref 8.4–10.5)
CO2: 31 meq/L (ref 19–32)
Chloride: 106 mEq/L (ref 96–112)
Creatinine, Ser: 0.85 mg/dL (ref 0.40–1.20)
GFR: 75.47 mL/min (ref >60.00)
GLUCOSE: 96 mg/dL (ref 70–99)
POTASSIUM: 5 meq/L (ref 3.5–5.1)
Sodium: 142 mEq/L (ref 135–145)
Total Protein: 6.8 g/dL (ref 6.0–8.3)

## 2017-08-24 LAB — HEMOGLOBIN A1C: HEMOGLOBIN A1C: 5.5 % (ref 4.6–6.5)

## 2017-08-24 LAB — TSH: TSH: 0.44 u[IU]/mL (ref 0.35–4.50)

## 2017-08-24 MED ORDER — SUMATRIPTAN SUCCINATE 50 MG PO TABS: ORAL_TABLET | ORAL | 0 refills | Status: DC

## 2017-08-24 MED ORDER — DEXAMETHASONE SODIUM PHOSPHATE 10 MG/ML IJ SOLN
INTRAMUSCULAR | Status: AC
  Filled 2017-08-24: qty 1

## 2017-08-24 MED ORDER — METOCLOPRAMIDE HCL 5 MG/ML IJ SOLN
5.0000 mg | Freq: Once | INTRAMUSCULAR | Status: AC
  Administered 2017-08-24: 5 mg via INTRAVENOUS

## 2017-08-24 MED ORDER — METOCLOPRAMIDE HCL 5 MG/ML IJ SOLN
INTRAMUSCULAR | Status: AC
  Filled 2017-08-24: qty 2

## 2017-08-24 MED ORDER — DEXAMETHASONE SODIUM PHOSPHATE 10 MG/ML IJ SOLN
10.0000 mg | Freq: Once | INTRAMUSCULAR | Status: AC
  Administered 2017-08-24: 10 mg via INTRAMUSCULAR

## 2017-08-24 MED ORDER — KETOROLAC TROMETHAMINE 60 MG/2ML IM SOLN
60.0000 mg | Freq: Once | INTRAMUSCULAR | Status: AC
  Administered 2017-08-24: 60 mg via INTRAMUSCULAR

## 2017-08-24 NOTE — Progress Notes (Signed)
[facility] [address], [place], [place] [address] [phone]  Date:  08/24/2017   Name:  Karen Merritt   DOB:  03-Mar-1970   MRN:  [id number]  PCP:  Mackie Pai, PA-C    Chief Complaint: Migraine (started friday with headache, saturday when she woke up, punding, nausea, stiff neck, no help with ibuprofen and exedrin )   History of Present Illness:  Karen Merritt is a 47 y.o. very pleasant female patient who presents with the following:  Pt of Karen Merritt who I have not seen in the past- she was last in to see Karen Merritt about one year ago  History of hypothyroidism and headache  Today is Monday- she notes a ha since this past Friday She tried ibuprofen and tylenol on Friday Saturday the ha was "pounding," she used ibuprofen and excedrin She notes that "nothing over the counter worked" She was going to go to UC on Saturday but they were too busy so she left She has noted nausea but did not vomit She has phono and photophobia She feels "a constant pressure" in her head She feels like it hurts to turn her head  She has not noted a HA like this in the past- this ha is worse and lasting longer   Pt was seen in November 2017and dx with a migraine- she was given a shot of toradol and imitrex. She did not try the imitrex this time as it is expired  No meds yet today Last night she did take Excedrin   She is generally in good health except for her thyroid No recent head injury  This is her worst HA of life   No ST, no belly pain  She is s/p hyst No neurological signs or sx otherwise   Patient Active Problem List   Diagnosis Date Noted  . Numbness 10/29/2015  . Vitamin D deficiency 10/29/2015  . Wellness examination 06/21/2015  . Midsternal chest pain 08/11/2014  . Palpitations 07/18/2014  . Otalgia 01/07/2012  . Pain in hand 10/20/2011  . Lumbar paraspinal muscle spasm 07/31/2011  . Abdominal pain 04/15/2011  .  URI (upper respiratory infection) 02/21/2011  . Urinary incontinence 10/30/2010  . General medical examination 10/30/2010  . SINUSITIS, RECURRENT 04/02/2010  . RHINITIS 03/01/2010  . COSTOCHONDRITIS 03/01/2010  . HEADACHE 02/22/2010  . SINUSITIS - ACUTE-NOS 10/22/2009  . ANAL FISSURE 10/04/2009  . RECTAL PAIN 06/29/2009  . EUSTACHIAN TUBE DYSFUNCTION, RIGHT 05/21/2009  . TINEA VERSICOLOR 06/07/2008  . OTHER SPECIFIED DISORDERS OF BILIARY TRACT 06/07/2008  . GOITER, MULTINODULAR 09/22/2007  . 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 SWELLING MASS OR LUMP IN HEAD AND NECK 09/22/2007  . HYPERCHOLESTEROLEMIA 04/16/2007    Past Medical History:  Diagnosis Date  . Chest pain    a. non-cardiac 2005, 2012.  Marland Kitchen Hemorrhoids 11-21-2009   colonoscopy  . 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 Hypothyroidism    a. s/p thyroidectomy ~ 1998  . Palpitations     Past Surgical History:  Procedure Laterality Date  . bone spur  6/11   right big toe  . SHOULDER ARTHROSCOPY WITH SUBACROMIAL DECOMPRESSION AND OPEN ROTATOR C Right 05/23/2013   Procedure: RIGHT SHOULDER ARTHROSCOPY WITH SUBACROMIAL DECOMPRESSION AND MINI OPEN ROTATOR CUFF REPAIR, AND DISTAL CLAVICLE RESECTION;  Surgeon: Augustin Schooling, MD;  Location: [facility];  Service: Orthopedics;  Laterality: Right;  . stress cardiolite  05/23/03  . THYROIDECTOMY  12/29/96  . TOTAL ABDOMINAL HYSTERECTOMY  2008    Social History   Tobacco Use  . Smoking status: Never Smoker  . Smokeless tobacco: Never Used  Substance Use Topics  . Alcohol use: No  . Drug use: No    Family History  Problem Relation Age of Onset  . Hypertension Mother   . Colon polyps Mother   . Coronary artery disease Mother        alive @ 23.  Marland Kitchen Heart disease Mother 32       10/20/13  . Stroke Mother   . Other Father        alive and well @ 25.  Marland Kitchen Hypertension Sister   . Migraines Brother   . Migraines Sister   . Bone cancer Maternal Aunt   .  Thyroid disease Maternal Aunt   . Liver cancer Maternal Aunt   . Hypertension Maternal Grandmother   . Diabetes Maternal Grandmother   . Coronary artery disease Maternal Grandfather   . Coronary artery disease Maternal Uncle   . Hypertension Maternal Uncle   . Heart disease Maternal Aunt     No Known Allergies  Medication list has been reviewed and updated.  Current Outpatient Medications on File Prior to Visit  Medication Sig Dispense Refill  . butalbital-aspirin-caffeine-codeine (FIORINAL WITH CODEINE) 50-325-40-30 MG capsule Take 1 capsule by mouth every 6 (six) hours as needed for pain, headache or migraine. 16 capsule 0  . dibucaine (NUPERCAINAL) 1 % ointment Apply topically 3 (three) times daily as needed for pain. 30 g 0  . ibuprofen (ADVIL,MOTRIN) 200 MG tablet Take 800 mg by mouth every 6 (six) hours as needed.    Marland Kitchen levothyroxine (SYNTHROID, LEVOTHROID) 100 MCG tablet TAKE 1 TABLET BY MOUTH  DAILY 90 tablet 0  . Melatonin 3 MG TABS Take 3 mg by mouth at bedtime as needed (sleep).     . SUMAtriptan (IMITREX) 50 MG tablet Take 1 tablet at the start of the headache, may repeat in 2 hours if not resolved. (Patient not taking: Reported on 08/24/2017) 10 tablet 0   No current facility-administered medications on file prior to visit.     Review of Systems:  As per HPI- otherwise negative. No fever or chills No CP or SOB   Physical Examination: Vitals:   08/24/17 0856  BP: 130/72  Pulse: 87  Resp: 16  SpO2: 98%   Vitals:   08/24/17 0856  Weight: 208 lb 3.2 oz (94.4 kg)  Height: 5\' 6"  (1.676 m)   Body mass index is 33.6 kg/m. Ideal Body Weight: Weight in (lb) to have BMI = 25: 154.6  GEN: WDWN, NAD, Non-toxic, A & O x 3, obese, wearing sunglasses due to photophobia  HEENT: Atraumatic, Normocephalic. Neck supple. No masses, No LAD.  Bilateral TM wnl, oropharynx normal.  PEERL,EOMI.   Tenderness to palpation of her bilateral trapezius muscles Ears and Nose: No  external deformity. CV: RRR, No M/G/R. No JVD. No thrill. No extra heart sounds. PULM: CTA B, no wheezes, crackles, rhonchi. No retractions. No resp. distress. No accessory muscle use. ABD: S, NT, ND EXTR: No c/c/e NEURO Normal gait.  PSYCH: Normally interactive. Conversant. Not depressed or anxious appearing.  Calm demeanor.  Full neuro exam today- normal strength, sensation, DTR of all limbs,  Normal RAM, gait is normal, normal facial movement No meningismus   Ct Head Wo Contrast  Result Date: 08/24/2017 CLINICAL DATA:  New daily persistent headache that started on Friday EXAM:  CT HEAD WITHOUT CONTRAST TECHNIQUE: Contiguous axial images were obtained from the base of the skull through the vertex without intravenous contrast. COMPARISON:  None. FINDINGS: Brain: No evidence of infarction, hemorrhage, hydrocephalus, extra-axial collection or mass lesion/mass effect. Vascular: No hyperdense vessel or unexpected calcification. Skull: Normal. Negative for fracture or focal lesion. Sinuses/Orbits: Negative for visualized sinusitis. 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 IMPRESSION: Negative head CT.  No explanation for headache Electronically Signed   By: Monte Fantasia M.D.   On: 08/24/2017 09:56    Assessment and Plan: Worst headache of life - Plan: ketorolac (TORADOL) injection 60 mg, SUMAtriptan (IMITREX) 50 MG tablet, CANCELED: CT Head Wo Contrast  New daily persistent headache - Plan: CANCELED: CT Head Wo Contrast  HYPOTHYROIDISM, POSTSURGICAL - Plan: TSH  Screening for deficiency anemia - Plan: CBC  Screening for diabetes mellitus - Plan: Comprehensive metabolic panel, Hemoglobin A1c  Here today with 4 days of HA,suspect migraine On exam she does not have a stiff neck or meningismus, but her trapezius muscles are sore and tight Obtained stat CT head today which is negative as above Pt came back upstairs and we treated her with a shot of toradol and I refilled her imitrex   Asked her to seek care if HA not resolved by later on today     Signed Lamar Blinks, MD  Also received her labs Results for orders placed or performed in visit on 08/24/17  CBC  Result Value Ref Range   WBC 6.7 4.0 - 10.5 K/uL   RBC 4.88 3.87 - 5.11 Mil/uL   Platelets 224.0 150.0 - 400.0 K/uL   Hemoglobin 13.9 12.0 - 15.0 g/dL   HCT 40.0 36.0 - 46.0 %   MCV 81.8 78.0 - 100.0 fl   MCHC 34.8 30.0 - 36.0 g/dL   RDW 13.9 11.5 - 15.5 %  Comprehensive metabolic panel  Result Value Ref Range   Sodium 142 135 - 145 mEq/L   Potassium 5.0 3.5 - 5.1 mEq/L   Chloride 106 96 - 112 mEq/L   CO2 31 19 - 32 mEq/L   Glucose, Bld 96 70 - 99 mg/dL   BUN 10 6 - 23 mg/dL   Creatinine, Ser 0.85 0.40 - 1.20 mg/dL   Total Bilirubin 0.6 0.2 - 1.2 mg/dL   Alkaline Phosphatase 65 39 - 117 U/L   AST 15 0 - 37 U/L   ALT 20 0 - 35 U/L   Total Protein 6.8 6.0 - 8.3 g/dL   Albumin 4.0 3.5 - 5.2 g/dL   Calcium 9.8 8.4 - 10.5 mg/dL   GFR 75.47 >60.00 mL/min  TSH  Result Value Ref Range   TSH 0.44 0.35 - 4.50 uIU/mL  Hemoglobin A1c  Result Value Ref Range   Hgb A1c MFr Bld 5.5 4.6 - 6.5 %   Message to pt

## 2017-08-24 NOTE — Patient Instructions (Addendum)
Good to see you today- I hope that you are feeling much better soon!  We gave you a shot of toradol today to help relieve you pain. Also, I refilled your imitrex migraine medication. If you want to try taking one that is fine!   If your headache is not resolved by later on today please do alert me- same if it comes back. If the headache gets worse please seek immediate care

## 2017-08-24 NOTE — ED Triage Notes (Signed)
PT reports headache since Friday. PT saw her PCP and had a shot of toradol. PT had a head CT today that was fine per PT. PT was given sumatriptan script, but did not use it.

## 2017-08-24 NOTE — ED Provider Notes (Signed)
[facility]    CSN: [id number] Arrival date & time: 08/24/17  1843     History   Chief Complaint Chief Complaint  Patient presents with  . Migraine    HPI Karen Merritt is a 47 y.o. female.   Patient has headache that began several days ago.  It is not really one-sided.  She was seen by her PCP today given a shot of Toradol and CT scan was ordered which was negative.  Toradol did not help.  There is no history of prior migraine.  HPI  Past Medical History:  Diagnosis Date  . Chest pain    a. non-cardiac 2005, 2012.  Marland Kitchen Hemorrhoids 11-21-2009   colonoscopy  . 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 Hypothyroidism    a. s/p thyroidectomy ~ 1998  . Palpitations     Patient Active Problem List   Diagnosis Date Noted  . Numbness 10/29/2015  . Vitamin D deficiency 10/29/2015  . Wellness examination 06/21/2015  . Midsternal chest pain 08/11/2014  . Palpitations 07/18/2014  . Otalgia 01/07/2012  . Pain in hand 10/20/2011  . Lumbar paraspinal muscle spasm 07/31/2011  . Abdominal pain 04/15/2011  . URI (upper respiratory infection) 02/21/2011  . Urinary incontinence 10/30/2010  . General medical examination 10/30/2010  . SINUSITIS, RECURRENT 04/02/2010  . RHINITIS 03/01/2010  . COSTOCHONDRITIS 03/01/2010  . HEADACHE 02/22/2010  . SINUSITIS - ACUTE-NOS 10/22/2009  . ANAL FISSURE 10/04/2009  . RECTAL PAIN 06/29/2009  . EUSTACHIAN TUBE DYSFUNCTION, RIGHT 05/21/2009  . TINEA VERSICOLOR 06/07/2008  . OTHER SPECIFIED DISORDERS OF BILIARY TRACT 06/07/2008  . GOITER, MULTINODULAR 09/22/2007  . 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 SWELLING MASS OR LUMP IN HEAD AND NECK 09/22/2007  . HYPERCHOLESTEROLEMIA 04/16/2007    Past Surgical History:  Procedure Laterality Date  . bone spur  6/11   right big toe  . SHOULDER ARTHROSCOPY WITH SUBACROMIAL DECOMPRESSION AND OPEN ROTATOR C Right 05/23/2013   Procedure: RIGHT SHOULDER ARTHROSCOPY WITH  SUBACROMIAL DECOMPRESSION AND MINI OPEN ROTATOR CUFF REPAIR, AND DISTAL CLAVICLE RESECTION;  Surgeon: Augustin Schooling, MD;  Location: [facility];  Service: Orthopedics;  Laterality: Right;  . stress cardiolite  05/23/03  . THYROIDECTOMY  12/29/96  . TOTAL ABDOMINAL HYSTERECTOMY  2008    OB History    Gravida  2   Para  2   Term      Preterm      AB      Living        SAB      TAB      Ectopic      Multiple      Live Births               Home Medications    Prior to Admission medications   Medication Sig Start Date End Date Taking? Authorizing Provider  levothyroxine (SYNTHROID, LEVOTHROID) 100 MCG tablet TAKE 1 TABLET BY MOUTH  DAILY 07/30/17  Yes Renato Shin, MD  butalbital-aspirin-caffeine-codeine [facility] WITH CODEINE) [phone] MG capsule Take 1 capsule by mouth every 6 (six) hours as needed for pain, headache or migraine. 05/23/16   Saguier, Percell Ellis Koffler, PA-C  dibucaine (NUPERCAINAL) 1 % ointment Apply topically 3 (three) times daily as needed for pain. 09/04/15   Saguier, Percell Param Capri, PA-C  ibuprofen (ADVIL,MOTRIN) 200 MG tablet Take 800 mg by mouth every 6 (six) hours as needed.    [provider]  Melatonin 3 MG TABS Take 3 mg by mouth at bedtime  as needed (sleep).     [provider]  SUMAtriptan (IMITREX) 50 MG tablet Take 1 tablet at the start of the headache, may repeat in 2 hours if not resolved. 08/24/17   Copland, Gay Filler, MD    Family History Family History  Problem Relation Age of Onset  . Hypertension Mother   . Colon polyps Mother   . Coronary artery disease Mother        alive @ 30.  Marland Kitchen Heart disease Mother 4       10/20/13  . Stroke Mother   . Other Father        alive and well @ 85.  Marland Kitchen Hypertension Sister   . Migraines Brother   . Migraines Sister   . Bone cancer Maternal Aunt   . Thyroid disease Maternal Aunt   . Liver cancer Maternal Aunt   . Hypertension Maternal Grandmother   . Diabetes Maternal Grandmother   . Coronary  artery disease Maternal Grandfather   . Coronary artery disease Maternal Uncle   . Hypertension Maternal Uncle   . Heart disease Maternal Aunt     Social History Social History   Tobacco Use  . Smoking status: Never Smoker  . Smokeless tobacco: Never Used  Substance Use Topics  . Alcohol use: No  . Drug use: No     Allergies   Patient has no known allergies.   Review of Systems Review of Systems  Constitutional: Negative for chills and fever.  HENT: Negative for ear pain and sore throat.   Eyes: Negative for pain and visual disturbance.  Respiratory: Negative for cough and shortness of breath.   Cardiovascular: Negative for chest pain and palpitations.  Gastrointestinal: Negative for abdominal pain and vomiting.  Genitourinary: Negative for dysuria and hematuria.  Musculoskeletal: Negative for arthralgias and back pain.  Skin: Negative for color change and rash.  Neurological: Positive for headaches. Negative for seizures and syncope.  All other systems reviewed and are negative.    Physical Exam Triage Vital Signs ED Triage Vitals [08/24/17 1934]  Enc Vitals Group     BP 131/70     Pulse Rate (!) 59     Resp 16     Temp 98.2 F (36.8 C)     Temp Source Oral     SpO2 100 %     Weight      Height      Head Circumference      Peak Flow      Pain Score 10     Pain Loc      Pain Edu?      Excl. in [place]?    No data found.  Updated Vital Signs BP 131/70   Pulse (!) 59   Temp 98.2 F (36.8 C) (Oral)   Resp 16   SpO2 100%   Visual Acuity Right Eye Distance:   Left Eye Distance:   Bilateral Distance:    Right Eye Near:   Left Eye Near:    Bilateral Near:     Physical Exam  Constitutional: She is oriented to person, place, and time. She appears well-developed and well-nourished.  HENT:  Head: Normocephalic.  Eyes: Pupils are equal, round, and reactive to light. EOM are normal.  Neck: Normal range of motion.  Trapezius muscle tenderness bilateral    Cardiovascular: Normal rate.  Pulmonary/Chest: Effort normal.  Neurological: She is alert and oriented to person, place, and time. She displays normal reflexes. No cranial nerve deficit.  She exhibits normal muscle tone. Coordination normal.     UC Treatments / Results  Labs (all labs ordered are listed, but only abnormal results are displayed) Labs Reviewed - No data to display  EKG None  Radiology Ct Head Wo Contrast  Result Date: 08/24/2017 CLINICAL DATA:  New daily persistent headache that started on Friday EXAM: CT HEAD WITHOUT CONTRAST TECHNIQUE: Contiguous axial images were obtained from the base of the skull through the vertex without intravenous contrast. COMPARISON:  None. FINDINGS: Brain: No evidence of infarction, hemorrhage, hydrocephalus, extra-axial collection or mass lesion/mass effect. Vascular: No hyperdense vessel or unexpected calcification. Skull: Normal. Negative for fracture or focal lesion. Sinuses/Orbits: Negative for visualized sinusitis. 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 IMPRESSION: Negative head CT.  No explanation for headache Electronically Signed   By: Monte Fantasia M.D.   On: 08/24/2017 09:56    Procedures Procedures (including critical care time)  Medications Ordered in UC Medications - No data to display  Initial Impression / Assessment and Plan / UC Course  I have reviewed the triage vital signs and the nursing notes.  Pertinent labs & imaging results that were available during my care of the patient were reviewed by me and considered in my medical decision making (see chart for details).     Headache.  Possible migraine but symptoms are not totally consistent.  Negative CT from earlier today. Final Clinical Impressions(s) / UC Diagnoses   Final diagnoses:  None   Discharge Instructions   None    ED Prescriptions    None     Controlled Substance Prescriptions [place] Controlled Substance Registry consulted? No    Wardell Honour, MD 08/24/17 2023

## 2017-08-25 ENCOUNTER — Ambulatory Visit: Payer: Self-pay

## 2017-08-25 ENCOUNTER — Telehealth: Payer: Self-pay

## 2017-08-25 ENCOUNTER — Encounter: Payer: Self-pay | Admitting: Family Medicine

## 2017-08-25 ENCOUNTER — Other Ambulatory Visit: Payer: Self-pay

## 2017-08-25 MED ORDER — BUTALBITAL-ASA-CAFF-CODEINE 50-325-40-30 MG PO CAPS: ORAL_CAPSULE | ORAL | 0 refills | Status: DC

## 2017-08-25 NOTE — Telephone Encounter (Signed)
Copied from [place] [phone] [phone]. Topic: General - Other >> Aug 24, 2017  3:59 PM Keene Breath wrote: Reason for CRM: Patient called to advise doctor that the shot she gave her in the office did not work.  She slept for a little while and the headache started right back as soon as she woke up.  Patient would like to know what to do at this time.  Patient stated that doctor told her to call back if the headaches continued.  CB# [phone]

## 2017-08-25 NOTE — Telephone Encounter (Signed)
Called pt- she ended up at UC last night and was given decadron and reglan.  Still did not help.  Will rx fiorinol with codeine for her now and check on her this afternoon.  If not resolved she will need to go to the ED at that time as she may need further eval with MRI She is advised not to drive after fiorinal She has not taken anything so far today  JC

## 2017-08-25 NOTE — Telephone Encounter (Signed)
Called pt at 6:45. She was able to get the fiorinal and took it- when she got up from a nap her HA was finally gone. She will continue to keep me posted about any other sx or if her HA comes back

## 2017-08-25 NOTE — Telephone Encounter (Signed)
Called pt and LMOM- [facility], called and confimed that they do have it.  However if she is having a lot of pain please go to ER as they can control her pain and do any other imaging that may be nedded at this time

## 2017-08-25 NOTE — Telephone Encounter (Signed)
Patient called again requesting that her medicine refill be sent to  [id number] - [place], [place] [address] [phone] (Phone) [phone] (Fax)   Because the [facility] does not have a supply of medicine.  The medications butalbital-aspirin-caffeine-codeine ([place]) 50-325-40-30 MG capsule needs to be approved by the doctor to go [facility] the [facility].  Patient states that she needs it as soon as possible.  CB# [phone].

## 2017-08-25 NOTE — Telephone Encounter (Signed)
Returned call to pt.  Reported that she just received a call from Dr. Lorelei Pont, and a medication is being ordered at the pharmacy.  Pt. Stated she was advised to go to the ER, if the pain medication does not ease her headache.  No further intervention or triage at this time.  Pt. knows to call back if any further questions or concerns.     Message from Pricilla Handler sent at 08/25/2017 8:58 AM EDT   Summary: Pain in Head   Patient is having extreme pain in her head. Please call.    Thank You!!!

## 2017-08-25 NOTE — Telephone Encounter (Signed)
Copied from [place] [phone]. Topic: Inquiry >> Aug 25, 2017  8:51 AM Pricilla Handler wrote: Reason for CRM: Patient pain has not gone away. Patient picked up the prescription, but the pharmacy states it will not work. Patient states that she will take the medication back to the pharmacy. Patient went to the Urgent Care where they stated that they could not do anything. Patient's pain has increased. Patient would like a call back from Dr. Lorelei Pont ASAP. Patient states her pain is very extreme at this time.       Thank You!!!

## 2017-08-25 NOTE — Addendum Note (Signed)
Addended by: Lamar Blinks C on: 08/25/2017 03:24 PM   Modules accepted: Orders

## 2017-08-25 NOTE — Addendum Note (Signed)
Addended by: Lamar Blinks C on: 08/25/2017 09:08 AM   Modules accepted: Orders

## 2017-08-26 ENCOUNTER — Other Ambulatory Visit: Payer: Self-pay

## 2017-08-26 ENCOUNTER — Telehealth: Payer: Self-pay

## 2017-08-26 DIAGNOSIS — R51 Headache: Principal | ICD-10-CM

## 2017-08-26 DIAGNOSIS — R519 Headache, unspecified: Secondary | ICD-10-CM

## 2017-08-26 MED ORDER — BUTALBITAL-ASA-CAFF-CODEINE 50-325-40-30 MG PO CAPS: ORAL_CAPSULE | ORAL | 0 refills | Status: DC

## 2017-08-26 NOTE — Telephone Encounter (Signed)
Pt having some improvement. Head isn't throbbing on the top anymore. Her head is still tender and still has a headache. She has taken the fiorinal last night at 9:30.  She hasn't taken since. She just woke up from last night. She just took 2 ibuprofen. She said an MRI was discussed if medicine did not work. Pt asking for advice.

## 2017-08-26 NOTE — Telephone Encounter (Signed)
08/26/17  Sent message to E. Saguier PA-C in error, re-sent to Dr. Lorelei Pont DOD.

## 2017-08-26 NOTE — Telephone Encounter (Signed)
Copied from [place] [phone]. Topic: Quick Communication - Rx Refill/Question >> Aug 25, 2017 11:37 AM Gardiner Ramus wrote: Medication: butalbital-aspirin-caffeine-codeine (FIORINAL WITH CODEINE) 50-325-40-30 MG capsule  Has the patient contacted their pharmacy? Yes  Preferred Pharmacy (with phone number or street name): [facility] [address] [phone]  Agent: Please be advised that RX refills may take up to 3 business days. We ask that you follow-up with your pharmacy.

## 2017-08-26 NOTE — Telephone Encounter (Signed)
Called and spoke with pt- her HA is better but her head still does not feel normal, feels like a pressure Will step up to an MRI for her- ordered MRI head stat  No contrast indicated per radiology

## 2017-08-26 NOTE — Telephone Encounter (Signed)
Copied from [place] [id number]. Topic: Inquiry >> Aug 25, 2017  8:51 AM Pricilla Handler wrote: Reason for CRM: Patient pain has not gone away. Patient picked up the prescription, but the pharmacy states it will not work. Patient states that she will take the medication back to the pharmacy. Patient went to the Urgent Care where they stated that they could not do anything. Patient's pain has increased. Patient would like a call back from Dr. Lorelei Pont ASAP. Patient states her pain is very extreme at this time.       Thank You!!!

## 2017-08-26 NOTE — Telephone Encounter (Signed)
Refill was sent 08/25/17

## 2017-08-26 NOTE — Addendum Note (Signed)
Addended by: Lamar Blinks C on: 08/26/2017 05:44 PM   Modules accepted: Orders

## 2017-08-27 ENCOUNTER — Other Ambulatory Visit: Payer: Self-pay | Admitting: Family Medicine

## 2017-08-27 ENCOUNTER — Ambulatory Visit
Admission: RE | Admit: 2017-08-27 | Discharge: 2017-08-27 | Disposition: A | Discharge status: Home / Self Care | Payer: 59 | Source: Ambulatory Visit | Attending: Family Medicine | Admitting: Family Medicine

## 2017-08-27 DIAGNOSIS — R51 Headache: Principal | ICD-10-CM

## 2017-08-27 DIAGNOSIS — R519 Headache, unspecified: Secondary | ICD-10-CM

## 2017-08-28 ENCOUNTER — Telehealth: Payer: Self-pay | Admitting: Medical

## 2017-08-28 ENCOUNTER — Encounter: Payer: Self-pay | Admitting: Family Medicine

## 2017-08-28 NOTE — Telephone Encounter (Signed)
Copied from [place] [phone]. Topic: Quick Communication - See Telephone Encounter >> Aug 28, 2017  1:11 PM Bea Graff, NT wrote: CRM for notification. See Telephone encounter for: 08/28/17. Pt would like a call with her MRI results.

## 2017-08-28 NOTE — Telephone Encounter (Signed)
Called her to discuss She is feeling better than yesterday, she got a neck massage yesterday Pressure in her head is better No fever No vomiting She did take 2 ibuprofen so far today Her MRI is normal I would suggest that we refer her to neurology if not better by the first of the week. Also consider LP if she is getting worse over the weekend. She will contact me if any worsening or other concern

## 2017-08-31 ENCOUNTER — Other Ambulatory Visit: Payer: Self-pay

## 2017-08-31 ENCOUNTER — Encounter (HOSPITAL_COMMUNITY): Payer: Self-pay | Admitting: *Deleted

## 2017-08-31 ENCOUNTER — Telehealth: Payer: Self-pay | Admitting: Medical

## 2017-08-31 ENCOUNTER — Emergency Department (HOSPITAL_COMMUNITY): Payer: 59

## 2017-08-31 ENCOUNTER — Emergency Department (HOSPITAL_COMMUNITY)
Admission: EM | Admit: 2017-08-31 | Discharge: 2017-09-01 | Disposition: A | Discharge status: Home / Self Care | Payer: 59 | Attending: Emergency Medicine | Admitting: Emergency Medicine

## 2017-08-31 ENCOUNTER — Encounter: Payer: Self-pay | Admitting: Internal Medicine

## 2017-08-31 DIAGNOSIS — Z79899 Other long term (current) drug therapy: Secondary | ICD-10-CM | POA: Insufficient documentation

## 2017-08-31 DIAGNOSIS — M9901 Segmental and somatic dysfunction of cervical region: Secondary | ICD-10-CM | POA: Diagnosis not present

## 2017-08-31 DIAGNOSIS — R42 Dizziness and giddiness: Secondary | ICD-10-CM | POA: Diagnosis not present

## 2017-08-31 DIAGNOSIS — M542 Cervicalgia: Secondary | ICD-10-CM | POA: Diagnosis not present

## 2017-08-31 DIAGNOSIS — R51 Headache: Secondary | ICD-10-CM | POA: Diagnosis not present

## 2017-08-31 DIAGNOSIS — E039 Hypothyroidism, unspecified: Secondary | ICD-10-CM | POA: Insufficient documentation

## 2017-08-31 DIAGNOSIS — M50122 Cervical disc disorder at C5-C6 level with radiculopathy: Secondary | ICD-10-CM | POA: Diagnosis not present

## 2017-08-31 DIAGNOSIS — R519 Headache, unspecified: Secondary | ICD-10-CM

## 2017-08-31 LAB — CBC WITH DIFFERENTIAL/PLATELET
Abs Immature Granulocytes: 0 10*3/uL (ref 0.0–0.1)
BASOS PCT: 1 %
Basophils Absolute: 0.1 10*3/uL (ref 0.0–0.1)
EOS ABS: 0.1 10*3/uL (ref 0.0–0.7)
EOS PCT: 1 %
HCT: 41.4 % (ref 36.0–46.0)
Hemoglobin: 14.4 g/dL (ref 12.0–15.0)
IMMATURE GRANULOCYTES: 0 %
LYMPHS ABS: 2.8 10*3/uL (ref 0.7–4.0)
Lymphocytes Relative: 28 %
MCH: 27.9 pg (ref 26.0–34.0)
MCHC: 34.8 g/dL (ref 30.0–36.0)
MCV: 80.1 fL (ref 78.0–100.0)
Monocytes Absolute: 0.8 10*3/uL (ref 0.1–1.0)
Monocytes Relative: 8 %
NEUTROS PCT: 62 %
Neutro Abs: 6.2 10*3/uL (ref 1.7–7.7)
PLATELETS: 256 10*3/uL (ref 150–400)
RBC: 5.17 MIL/uL — AB (ref 3.87–5.11)
RDW: 13.2 % (ref 11.5–15.5)
WBC: 9.9 10*3/uL (ref 4.0–10.5)

## 2017-08-31 LAB — BASIC METABOLIC PANEL
Anion gap: 8 (ref 5–15)
BUN: 8 mg/dL (ref 6–20)
CALCIUM: 9.2 mg/dL (ref 8.9–10.3)
CHLORIDE: 105 mmol/L (ref 98–111)
CO2: 27 mmol/L (ref 22–32)
Creatinine, Ser: 0.77 mg/dL (ref 0.44–1.00)
Glucose, Bld: 86 mg/dL (ref 70–99)
Potassium: 3.8 mmol/L (ref 3.5–5.1)
Sodium: 140 mmol/L (ref 135–145)

## 2017-08-31 MED ORDER — PROCHLORPERAZINE EDISYLATE 10 MG/2ML IJ SOLN
10.0000 mg | Freq: Once | INTRAMUSCULAR | Status: AC
  Administered 2017-08-31: 10 mg via INTRAVENOUS
  Filled 2017-08-31: qty 2

## 2017-08-31 MED ORDER — DEXAMETHASONE SODIUM PHOSPHATE 10 MG/ML IJ SOLN
10.0000 mg | Freq: Once | INTRAMUSCULAR | Status: AC
  Administered 2017-08-31: 10 mg via INTRAVENOUS
  Filled 2017-08-31: qty 1

## 2017-08-31 MED ORDER — SODIUM CHLORIDE 0.9 % IV SOLN
INTRAVENOUS | Status: DC
  Administered 2017-08-31: via INTRAVENOUS

## 2017-08-31 MED ORDER — SODIUM CHLORIDE 0.9 % IV BOLUS
1000.0000 mL | Freq: Once | INTRAVENOUS | Status: AC
  Administered 2017-08-31: 1000 mL via INTRAVENOUS

## 2017-08-31 MED ORDER — KETOROLAC TROMETHAMINE 15 MG/ML IJ SOLN
15.0000 mg | Freq: Once | INTRAMUSCULAR | Status: AC
  Administered 2017-08-31: 15 mg via INTRAVENOUS
  Filled 2017-08-31: qty 1

## 2017-08-31 MED ORDER — IOPAMIDOL (ISOVUE-370) INJECTION 76%
INTRAVENOUS | Status: AC
  Administered 2017-08-31: 50 mL
  Filled 2017-08-31: qty 50

## 2017-08-31 MED ORDER — DIPHENHYDRAMINE HCL 50 MG/ML IJ SOLN
12.5000 mg | Freq: Once | INTRAMUSCULAR | Status: AC
  Administered 2017-08-31: 12.5 mg via INTRAVENOUS
  Filled 2017-08-31: qty 1

## 2017-08-31 NOTE — Progress Notes (Signed)
I received a call from Dr Joni Fears (chiropractor) who evaluated patient today for one week history of severe unrelenting  Headache of 7 days duration , worst headache of her llife."   Unresponsive to multiple migraine medications.   Dr Joni Fears examined the patient  found her to be hypertensive (new onset  BP 142/9 m r and 146/9 3 on L) with nuchal rigidity .  He did not manipulate her due to concern about a possible vertebral artery aneurysm in her cervical spine .  He has recommended an MRA cervical spine and is sending patient to the [facility] ER for emergent evaluation

## 2017-08-31 NOTE — Telephone Encounter (Signed)
Dr. Alm Bustard from [facility] calling to make Dr. Lorelei Pont aware that the pt's MRI of head without contrast came back normal but his recommendation would be for the pt to have a MRA of cervical spine done in the ED. On call provider, Dr. Derrel Nip called and conference call initiated with Dr. Alm Bustard.

## 2017-08-31 NOTE — Telephone Encounter (Signed)
Routed to Dr. Lorelei Pont and Mackie Pai, PCP.

## 2017-08-31 NOTE — Telephone Encounter (Signed)
Reviewed and saw that pt is in the ED today. Will follow work up and see what MRA of head shows.

## 2017-08-31 NOTE — ED Provider Notes (Signed)
MSE was initiated and I personally evaluated the patient and placed orders (if any) at  6:41 PM on August 31, 2017.  The patient appears stable so that the remainder of the MSE may be completed by another provider.  Patient placed in Quick Look pathway, seen and evaluated   Chief Complaint: Neck pain and headache.  HPI:   Patient is a 47 year old female with a history of hypothyroidism, hypercholesterolemia, presenting for occipital headache and posterior neck pain for 2 weeks.  Patient has presented to her primary care provider who was ordered a CT and MRI, and presented to a chiropractor today, who did not perform any manipulation due to concerns about cervical artery dissection.  Recommended transfer to emergency department.  Patient reports that over the past couple weeks, she has had persistent vertigo, throbbing pain, and feeling of slight weakness in her hands.  No fever or chills.  ROS: See HPI (one)  Physical Exam:   Gen: No distress  Neuro: Awake and Alert  Skin: Warm    Focused Exam: Patient has occipital posterior neck discomfort to palpation.  No midline tenderness.  No nuchal rigidity.  Strength 5 out of 5 in upper extremity's.   Initiation of care has begun. The patient has been counseled on the process, plan, and necessity for staying for the completion/evaluation, and the remainder of the medical screening examination   6:41 PM Discussed history Dr. Pryor Curia, who recommends CT angio neck.   Albesa Seen, PA-C 08/31/17 1843    Pattricia Boss, MD 09/03/17 [phone]

## 2017-08-31 NOTE — ED Provider Notes (Signed)
[facility] Provider Note   CSN: [id number] Arrival date & time: 08/31/17  1758     History   Chief Complaint Chief Complaint  Patient presents with  . Headache    HPI Karen [facility] is a 47 y.o. female with a history of hypothyroidism who presents to the emergency department for headache for the past 10 days.  Patient states that she woke up with a headache acutely diffusely to the entire head and into the base of the neck bilaterally.  Patient states headache has been constant since onset, however it has moved to the occipital and bilateral neck area only.  Pain is a 9 out of 10 in severity.  Somewhat alleviated by application of ice/cool compresses, worse with loud noises and movement of the neck.  She does report intermittent associated nausea, blurry vision (bilateral eyes), dizziness, muffled hearing, and bilateral hand tingling.  Her associated symptoms are not constant, they occur intermittently, not necessarily together.  She has been seen by her primary care provider for this headache, she has had a CT head without contrast as well as an MRI without contrast both of which were unremarkable.  Her primary care provider has trialed IM Toradol in the office without improvement, she is also received prescription for Ascomp with codeine which she tried without relief.  She has tried Tylenol and Motrin without change.  She was seen today by her chiropractor who sent her to the emergency department due to concern for possible arterial dissection.  Patient denies fever, rashes, tick exposures, weakness, or syncope.  Denies recent tick exposures.  HPI  Past Medical History:  Diagnosis Date  . Chest pain    a. non-cardiac 2005, 2012.  Marland Kitchen Hemorrhoids 11-21-2009   colonoscopy  . 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 Hypothyroidism    a. s/p thyroidectomy ~ 1998  . Palpitations     Patient Active Problem List   Diagnosis Date Noted    . Numbness 10/29/2015  . Vitamin D deficiency 10/29/2015  . Wellness examination 06/21/2015  . Midsternal chest pain 08/11/2014  . Palpitations 07/18/2014  . Otalgia 01/07/2012  . Pain in hand 10/20/2011  . Lumbar paraspinal muscle spasm 07/31/2011  . Abdominal pain 04/15/2011  . URI (upper respiratory infection) 02/21/2011  . Urinary incontinence 10/30/2010  . General medical examination 10/30/2010  . SINUSITIS, RECURRENT 04/02/2010  . RHINITIS 03/01/2010  . COSTOCHONDRITIS 03/01/2010  . HEADACHE 02/22/2010  . SINUSITIS - ACUTE-NOS 10/22/2009  . ANAL FISSURE 10/04/2009  . RECTAL PAIN 06/29/2009  . EUSTACHIAN TUBE DYSFUNCTION, RIGHT 05/21/2009  . TINEA VERSICOLOR 06/07/2008  . OTHER SPECIFIED DISORDERS OF BILIARY TRACT 06/07/2008  . GOITER, MULTINODULAR 09/22/2007  . 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 SWELLING MASS OR LUMP IN HEAD AND NECK 09/22/2007  . HYPERCHOLESTEROLEMIA 04/16/2007    Past Surgical History:  Procedure Laterality Date  . bone spur  6/11   right big toe  . SHOULDER ARTHROSCOPY WITH SUBACROMIAL DECOMPRESSION AND OPEN ROTATOR C Right 05/23/2013   Procedure: RIGHT SHOULDER ARTHROSCOPY WITH SUBACROMIAL DECOMPRESSION AND MINI OPEN ROTATOR CUFF REPAIR, AND DISTAL CLAVICLE RESECTION;  Surgeon: Augustin Schooling, MD;  Location: [facility];  Service: Orthopedics;  Laterality: Right;  . stress cardiolite  05/23/03  . THYROIDECTOMY  12/29/96  . TOTAL ABDOMINAL HYSTERECTOMY  2008     OB History    Gravida  2   Para  2   Term      Preterm  AB      Living        SAB      TAB      Ectopic      Multiple      Live Births               Home Medications    Prior to Admission medications   Medication Sig Start Date End Date Taking? Authorizing Provider  acetaminophen (TYLENOL) 500 MG tablet Take 500 mg by mouth every 6 (six) hours as needed for headache (pain).   Yes [provider]  butalbital-aspirin-caffeine-codeine  (ASCOMP-CODEINE) 50-325-40-30 MG capsule Take 1 capsule by mouth every 6 (six) hours as needed for headache.   Yes [provider]  dibucaine (NUPERCAINAL) 1 % ointment Apply topically 3 (three) times daily as needed for pain. Patient taking differently: Apply 1 application topically 3 (three) times daily as needed for pain.  09/04/15  Yes Saguier, Percell Miller, PA-C  ibuprofen (ADVIL,MOTRIN) 200 MG tablet Take 400 mg by mouth every 6 (six) hours as needed for headache (pain).    Yes [provider]  levothyroxine (SYNTHROID, LEVOTHROID) 100 MCG tablet TAKE 1 TABLET BY MOUTH  DAILY Patient taking differently: Take 100 mcg by mouth daily before breakfast.  07/30/17  Yes Renato Shin, MD  Melatonin 3 MG TABS Take 3 mg by mouth at bedtime as needed (sleep).    Yes [provider]  butalbital-aspirin-caffeine-codeine Acquanetta Chain WITH CODEINE) 50-325-40-30 MG capsule Take 1 or 2 caps every 6 hours as needed for headache, max 6 per 24 hours Patient not taking: Reported on 08/31/2017 08/26/17   Copland, Gay Filler, MD  SUMAtriptan (IMITREX) 50 MG tablet Take 1 tablet at the start of the headache, may repeat in 2 hours if not resolved. Patient not taking: Reported on 08/31/2017 08/24/17   Copland, Gay Filler, MD    Family History Family History  Problem Relation Age of Onset  . Hypertension Mother   . Colon polyps Mother   . Coronary artery disease Mother        alive @ 71.  Marland Kitchen Heart disease Mother 51       10/20/13  . Stroke Mother   . Other Father        alive and well @ 58.  Marland Kitchen Hypertension Sister   . Migraines Brother   . Migraines Sister   . Bone cancer Maternal Aunt   . Thyroid disease Maternal Aunt   . Liver cancer Maternal Aunt   . Hypertension Maternal Grandmother   . Diabetes Maternal Grandmother   . Coronary artery disease Maternal Grandfather   . Coronary artery disease Maternal Uncle   . Hypertension Maternal Uncle   . Heart disease Maternal Aunt     Social  History Social History   Tobacco Use  . Smoking status: Never Smoker  . Smokeless tobacco: Never Used  Substance Use Topics  . Alcohol use: No  . Drug use: No     Allergies   Patient has no known allergies.   Review of Systems Review of Systems  Constitutional: Negative for chills and fever.  HENT: Positive for hearing loss (muffled at times). Negative for ear pain, sore throat, trouble swallowing and voice change.   Eyes: Positive for visual disturbance (intermittent blurry).  Respiratory: Negative for shortness of breath.   Cardiovascular: Negative for chest pain.  Gastrointestinal: Positive for nausea (intermittent). Negative for abdominal pain, anal bleeding, blood in stool, constipation and vomiting.  Musculoskeletal: Positive for  neck pain.  Skin: Negative for rash.  Neurological: Positive for dizziness (intermittent), numbness (tingling to the hands, intermittent) and headaches. Negative for syncope and weakness.  All other systems reviewed and are negative.    Physical Exam Updated Vital Signs BP 123/74 (BP Location: Left Arm)   Pulse (!) 54   Temp 98.9 F (37.2 C) (Oral)   Resp 18   SpO2 99%   Physical Exam  Constitutional: She appears well-developed and well-nourished.  Non-toxic appearance. No distress.  HENT:  Head: Normocephalic and atraumatic.  Right Ear: Tympanic membrane and ear canal normal. Tympanic membrane is not perforated, not erythematous, not retracted and not bulging.  Left Ear: Tympanic membrane and ear canal normal. Tympanic membrane is not perforated, not erythematous, not retracted and not bulging.  Nose: Nose normal.  Mouth/Throat: Uvula is midline and oropharynx is clear and moist. No oropharyngeal exudate or posterior oropharyngeal erythema.  Eyes: Pupils are equal, round, and reactive to light. Conjunctivae and EOM are normal. Right eye exhibits no discharge. Left eye exhibits no discharge.  No proptosis.  Neck: Normal range of  motion. Neck supple. Spinous process tenderness (Diffuse nonfocal) and muscular tenderness present. Carotid bruit is not present. No neck rigidity. No edema and no erythema present.  Patient has full range of motion intact, she does have some discomfort with range of motion however.  Cardiovascular: Regular rhythm. Bradycardia present.  No murmur heard. Pulmonary/Chest: Effort normal and breath sounds normal. No respiratory distress. She has no wheezes. She has no rhonchi. She has no rales.  Respiration even and unlabored  Abdominal: Soft. She exhibits no distension. There is no tenderness.  Lymphadenopathy:    She has no cervical adenopathy.  Neurological: She is alert.  Alert. Clear speech. No facial droop. CNIII-XII grossly intact. Bilateral upper and lower extremities' sensation intact to sharp/dull touch.  5/5 symmetric strength with grip strength and with plantar and dorsi flexion bilaterally. Patellar DTRs are 2+ and symmetric . Normal finger to nose bilaterally. Negative pronator drift. Negative Romberg sign. Gait is steady and intact.   Skin: Skin is warm and dry. No rash noted.  Psychiatric: She has a normal mood and affect. Her behavior is normal.  Nursing note and vitals reviewed.    ED Treatments / Results  Labs Results for orders placed or performed during the hospital encounter of 82/42/35  Basic metabolic panel  Result Value Ref Range   Sodium 140 135 - 145 mmol/L   Potassium 3.8 3.5 - 5.1 mmol/L   Chloride 105 98 - 111 mmol/L   CO2 27 22 - 32 mmol/L   Glucose, Bld 86 70 - 99 mg/dL   BUN 8 6 - 20 mg/dL   Creatinine, Ser 0.77 0.44 - 1.00 mg/dL   Calcium 9.2 8.9 - 10.3 mg/dL   GFR calc non Af Amer >60 >60 mL/min   GFR calc Af Amer >60 >60 mL/min   Anion gap 8 5 - 15  CBC with Differential  Result Value Ref Range   WBC 9.9 4.0 - 10.5 K/uL   RBC 5.17 (H) 3.87 - 5.11 MIL/uL   Hemoglobin 14.4 12.0 - 15.0 g/dL   HCT 41.4 36.0 - 46.0 %   MCV 80.1 78.0 - 100.0 fL   MCH  27.9 26.0 - 34.0 pg   MCHC 34.8 30.0 - 36.0 g/dL   RDW 13.2 11.5 - 15.5 %   Platelets 256 150 - 400 K/uL   Neutrophils Relative % 62 %   Neutro  Abs 6.2 1.7 - 7.7 K/uL   Lymphocytes Relative 28 %   Lymphs Abs 2.8 0.7 - 4.0 K/uL   Monocytes Relative 8 %   Monocytes Absolute 0.8 0.1 - 1.0 K/uL   Eosinophils Relative 1 %   Eosinophils Absolute 0.1 0.0 - 0.7 K/uL   Basophils Relative 1 %   Basophils Absolute 0.1 0.0 - 0.1 K/uL   Immature Granulocytes 0 %   Abs Immature Granulocytes 0.0 0.0 - 0.1 K/uL    EKG None  Radiology Ct Angio Neck W And/or Wo Contrast  Result Date: 08/31/2017 CLINICAL DATA:  Headache and neck pain for approximately 10 days. Recent chiropractor visit. EXAM: CT ANGIOGRAPHY NECK TECHNIQUE: Multidetector CT imaging of the neck was performed using the standard protocol during bolus administration of intravenous contrast. Multiplanar CT image reconstructions and MIPs were obtained to evaluate the vascular anatomy. Carotid stenosis measurements (when applicable) are obtained utilizing NASCET criteria, using the distal internal carotid diameter as the denominator. CONTRAST:  43mL ISOVUE-370 IOPAMIDOL (ISOVUE-370) INJECTION 76% COMPARISON:  Head CT 08/24/2017 and brain MRI 08/27/2017 FINDINGS: Aortic arch: There is no calcific atherosclerosis of the aortic arch. There is no aneurysm, dissection or hemodynamically significant stenosis of the visualized ascending aorta and aortic arch. Conventional 3 vessel aortic branching pattern. The visualized proximal subclavian arteries are widely patent. Right carotid system: --Common carotid artery: Widely patent origin without common carotid artery dissection or aneurysm. --Internal carotid artery: No dissection, occlusion or aneurysm. No hemodynamically significant stenosis. --External carotid artery: No acute abnormality. Left carotid system: --Common carotid artery: Widely patent origin without common carotid artery dissection or aneurysm.  --Internal carotid artery:No dissection, occlusion or aneurysm. No hemodynamically significant stenosis. --External carotid artery: No acute abnormality. Vertebral arteries: Codominant configuration. Both origins are normal. No dissection, occlusion or flow-limiting stenosis to the vertebrobasilar confluence. Skeleton: There is no bony spinal canal stenosis. No lytic or blastic lesion. Other neck: Normal pharynx, larynx and major salivary glands. No cervical lymphadenopathy. Status post thyroidectomy. Upper chest: No pneumothorax or pleural effusion. No nodules or masses. Review of the MIP images confirms the above findings IMPRESSION: Normal CTA of the neck. Electronically Signed   By: Ulyses Jarred M.D.   On: 08/31/2017 21:49    Procedures Procedures (including critical care time)  Medications Ordered in ED Medications  iopamidol (ISOVUE-370) 76 % injection (50 mLs  Contrast Given 08/31/17 2105)   Prior imaging reviewed: 08/24/17: CT Head WO Contrast:  IMPRESSION: Negative head CT.  No explanation for headache  08/27/17: MR BRAIN WO CONTRAST:  IMPRESSION: Normal MRI of the brain.  Initial Impression / Assessment and Plan / ED Course  I have reviewed the triage vital signs and the nursing notes.  Pertinent labs & imaging results that were available during my care of the patient were reviewed by me and considered in my medical decision making (see chart for details).    Patient presents with complaint of headache. Patient is nontoxic appearing, vitals without significant abnormality- mild bradycardia, mild elevation in systolic BP. Patient has a fairly benign exam, some diffuse cervical tenderness, otherwise unremarkable, no focal deficits. Patient's prior imaging including MRI of the brain wo contrast and CT head wo contrast reviewed - normal. CTA of the neck obtained today per provider in triage- negative. Labs also unremarkable. Given H&P with imaging- do not suspect SAH, ICH, ischemic CVA,  dural venous sinus thrombosis, arterial dissection, acute glaucoma, giant cell arteritis, mass, or meningitis. Patient treated for headache with migraine cocktail  with improvement.  Will provide prescriptions for Naproxen and Robaxin with close neurology and PCP follow-up, patient instructed not to drive or operate machinery when taking Robaxin.  I discussed results, treatment plan, need for follow-up, and return precautions with the patient and family at bedside. Provided opportunity for questions, patient and family confirmed understanding and are in agreement with plan.   Findings and plan of care discussed with supervising physician Dr. Maryan Rued who is in agreement with plan.    Final Clinical Impressions(s) / ED Diagnoses   Final diagnoses:  Neck pain  Bad headache    ED Discharge Orders         Ordered    naproxen (NAPROSYN) 500 MG tablet  2 times daily     09/01/17 0032    methocarbamol (ROBAXIN) 500 MG tablet  Every 8 hours PRN     09/01/17 0032           Keliah Harned, Glynda Jaeger, PA-C 09/01/17 0051    Blanchie Dessert, MD 09/01/17 2208

## 2017-08-31 NOTE — ED Triage Notes (Signed)
Pt in c/o continued headache and neck pain since Aug 2nd, she has been seen at her PCP and had a CT and MRI completed, went to a chiropractor today who recommended she come back to the ED for a MRA, states the headache is consistent, pain is worse when she moves her head, reports nausea with this as well

## 2017-08-31 NOTE — Telephone Encounter (Signed)
Called pt at 6:15 pm, she was at ER and seeing provider

## 2017-09-01 ENCOUNTER — Telehealth: Payer: Self-pay

## 2017-09-01 MED ORDER — METHOCARBAMOL 500 MG PO TABS: 500.0000 mg | ORAL_TABLET | Freq: Three times a day (TID) | ORAL | 0 refills | Status: DC | PRN

## 2017-09-01 MED ORDER — NAPROXEN 500 MG PO TABS: 500.0000 mg | ORAL_TABLET | Freq: Two times a day (BID) | ORAL | 0 refills | Status: DC

## 2017-09-01 NOTE — Telephone Encounter (Signed)
Called patient to schedule a ED follow up visit. Left messafe for return call.

## 2017-09-01 NOTE — Discharge Instructions (Addendum)
You are seen in the emergency department today for continued headache.  The CT of your neck did not show any concerning findings.  Your labs are fairly normal.  We are sending him to prescriptions to help with discomfort: - - Naproxen is a nonsteroidal anti-inflammatory medication that will help with pain and swelling. Be sure to take this medication as prescribed with food, 1 pill every 12 hours,  It should be taken with food, as it can cause stomach upset, and more seriously, stomach bleeding. Do not take other nonsteroidal anti-inflammatory medications with this such as Advil, Motrin, Aleve, Mobic, Goodie Powder, or Motrin.    - Robaxin is the muscle relaxer I have prescribed, this is meant to help with muscle tightness. Be aware that this medication may make you drowsy therefore the first time you take this it should be at a time you are in an environment where you can rest. Do not drive or operate heavy machinery when taking this medication. Do not drink alcohol or take other sedating medications with this medicine such as narcotics or benzodiazepines.   You make take Tylenol per over the counter dosing with these medications.   We have prescribed you new medication(s) today. Discuss the medications prescribed today with your pharmacist as they can have adverse effects and interactions with your other medicines including over the counter and prescribed medications. Seek medical evaluation if you start to experience new or abnormal symptoms after taking one of these medicines, seek care immediately if you start to experience difficulty breathing, feeling of your throat closing, facial swelling, or rash as these could be indications of a more serious allergic reaction   Would like you to follow-up closely with neurology for reevaluation, please call our office tomorrow morning to be seen by the end of the week.  Return to the ER for new or worsening symptoms or any other concerns.

## 2017-09-01 NOTE — Telephone Encounter (Signed)
Patient called about scheduling ER follow up.  She asked if she should follow up with Percell Miller or you.  She is to follow up with neuro in 3 days. She stated the same thing in her mychart message about how she feels.  Should she just follow up with neuro?

## 2017-09-02 NOTE — Progress Notes (Addendum)
NEUROLOGY CONSULT OFFICE NOTE  Charles Niese [id number]  DOB:  1971-01-07  Referring Provider:  ED referral PCP:  Mackie Pai, PA-C  HISTORY OF PRESENT ILLNESS: Karen Merritt is a 47 year old right-handed female with secondary hypothyroidism s/p thyroidectomy in 1998 who presents for headaches.  History supplemented by ED and referring provider's notes.    HISTORY: Onset:  08/21/17.  Woke up in morning with occipital headache radiating into neck and shoulders. Location:  Across back of head/suboccipital region and radiates diffusely with neck pain with pain and tingling radiating down both arms. Quality:  Constant pressure with episodes of explosion Intensity:  Moderate to episodes of severe. Aura:  no Prodrome:  no Postdrome:  no Associated symptoms:  Nausea, photophobia, phonophobia, intermittent blurred vision, dizziness, muffled hearing.  She denies associated unilateral numbness or weakness. Duration:  Constant ache, exploding episodes last 10-15 seconds Frequency:  Constant ache, exploding episodes occur daily Exacerbating factors:  Bending over, removing ice from her head, sudden movements Relieving factors:  Ice on top and back of head Activity:  aggravates She has past history of headaches but this is different. It is severe and persistent.  She has been to the ED on 3 occasions since onset.  CT of head without contrast performed on 08/24/17 was personally reviewed and was negative for acute bleed, stroke or mass lesion.   MRI of brain without contrast performed on 08/27/17 was personally reviewed and was normal.  On 08/31/17 she went to her chiropractor due to headache and neck pain.  She was sent to the ED for evaluation of arterial dissection.  CTA of neck from 08/31/17 was personally reviewed and was normal without evidence of dissection or other vascular abnormality.  Current NSAIDS:  Naproxen 500mg  Current analgesics:  Tylenol. Current triptans:  no Current ergotamine:   no Current anti-emetic:  no Current muscle relaxants:  Robaxin 500mg  Current anti-anxiolytic:  no Current sleep aide:  no Current Antihypertensive medications:  no Current Antidepressant medications:  no Current Anticonvulsant medications:  no Current anti-CGRP:  no Current Vitamins/Herbal/Supplements:  melatonin Current Antihistamines/Decongestants:  no Other therapy:  chiropractic  Past NSAIDS:  Ibuprofen 400mg , IM Toradol Past analgesics:  Tylenol, Ascomp-Codeine Past abortive triptans:  Sumatriptan 50mg  (several years ago) Past abortive ergotamine:  no Past muscle relaxants:  no Past anti-emetic:  Promethazine (several years ago) Past antihypertensive medications:  Metoprolol (palpitations) Past antidepressant medications:  no Past anticonvulsant medications:  no Past anti-CGRP:  no Past vitamins/Herbal/Supplements:  no Past antihistamines/decongestants:  no Other past therapies:  no  Caffeine:  1 cup coffee at work Alcohol:  no Smoker:  no Diet:  Hydrates with water.  No soda. Exercise:  Yes.  Water aerobics 3 days a week but unable with these headaches Depression:  no; Anxiety: No.     Sleep hygiene:  Okay.  Takes melatonin sometimes. She reports history of severe pounding headache in 2017.  This was different.  It was treated with toradol, which aborted it. Family history of headache:  No  08/31/17:  CBC with WBC 9.9, HGB 14.4, HCT 41.4, PLT 256; BMP with Na 140, K 3.8, glucose 86, BUN 8, Cr 0.77 08/24/17:  Hepatic panel with t bili 0.6, ALP 65, AST 15, ALT 20; TSH 0.44  PAST MEDICAL HISTORY: Past Medical History:  Diagnosis Date  . Chest pain    a. non-cardiac 2005, 2012.  Marland Kitchen Hemorrhoids 11-21-2009   colonoscopy  . 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 Hypothyroidism  a. s/p thyroidectomy ~ 1998  . Palpitations     MEDICATIONS: Current Outpatient Medications on File Prior to Visit  Medication Sig Dispense Refill  . acetaminophen (TYLENOL)  500 MG tablet Take 500 mg by mouth every 6 (six) hours as needed for headache (pain).    . butalbital-aspirin-caffeine-codeine (ASCOMP-CODEINE) 50-325-40-30 MG capsule Take 1 capsule by mouth every 6 (six) hours as needed for headache.    . dibucaine (NUPERCAINAL) 1 % ointment Apply topically 3 (three) times daily as needed for pain. (Patient taking differently: Apply 1 application topically 3 (three) times daily as needed for pain. ) 30 g 0  . ibuprofen (ADVIL,MOTRIN) 200 MG tablet Take 400 mg by mouth every 6 (six) hours as needed for headache (pain).     Marland Kitchen levothyroxine (SYNTHROID, LEVOTHROID) 100 MCG tablet TAKE 1 TABLET BY MOUTH  DAILY (Patient taking differently: Take 100 mcg by mouth daily before breakfast. ) 90 tablet 0  . Melatonin 3 MG TABS Take 3 mg by mouth at bedtime as needed (sleep).     . methocarbamol (ROBAXIN) 500 MG tablet Take 1 tablet (500 mg total) by mouth every 8 (eight) hours as needed. 30 tablet 0  . naproxen (NAPROSYN) 500 MG tablet Take 1 tablet (500 mg total) by mouth 2 (two) times daily. 30 tablet 0   No current facility-administered medications on file prior to visit.     ALLERGIES: No Known Allergies  FAMILY HISTORY: Family History  Problem Relation Age of Onset  . Hypertension Mother   . Colon polyps Mother   . Coronary artery disease Mother        alive @ 66.  Marland Kitchen Heart disease Mother 34       10/20/13  . Stroke Mother   . Other Father        alive and well @ 6.  Marland Kitchen Hypertension Sister   . Migraines Brother   . Migraines Sister   . Bone cancer Maternal Aunt   . Thyroid disease Maternal Aunt   . Liver cancer Maternal Aunt   . Hypertension Maternal Grandmother   . Diabetes Maternal Grandmother   . Coronary artery disease Maternal Grandfather   . Coronary artery disease Maternal Uncle   . Hypertension Maternal Uncle   . Heart disease Maternal Aunt     SOCIAL HISTORY: Social History   Socioeconomic History  . Marital status: Divorced    Spouse  name: Not on file  . Number of children: 2  . Years of education: 76  . Highest education level: Not on file  Occupational History  . Occupation: Nurse, mental health: [employer]  . Financial resource strain: Not on file  . Food insecurity:    Worry: Not on file    Inability: Not on file  . Transportation needs:    Medical: Not on file    Non-medical: Not on file  Tobacco Use  . Smoking status: Never Smoker  . Smokeless tobacco: Never Used  Substance and Sexual Activity  . Alcohol use: No  . Drug use: No  . Sexual activity: Not on file  Lifestyle  . Physical activity:    Days per week: Not on file    Minutes per session: Not on file  . Stress: Not on file  Relationships  . Social connections:    Talks on phone: Not on file    Gets together: Not on file    Attends religious service: Not on file  Active member of club or organization: Not on file    Attends meetings of clubs or organizations: Not on file    Relationship status: Not on file  . Intimate partner violence:    Fear of current or ex partner: Not on file    Emotionally abused: Not on file    Physically abused: Not on file    Forced sexual activity: Not on file  Other Topics Concern  . Not on file  Social History Narrative   Lives in [place].  Active, walks/jogs.  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   Right-handed   Caffeine: occasionally    REVIEW OF SYSTEMS: Constitutional: No fevers, chills, or sweats, no generalized fatigue, change in appetite Eyes: No visual changes, double vision, eye pain Ear, nose and throat: No hearing loss, ear pain, nasal congestion, sore throat Cardiovascular: No chest pain, palpitations Respiratory:  No shortness of breath at rest or with exertion, wheezes GastrointestinaI: nausea Genitourinary:  No dysuria, urinary retention or frequency Musculoskeletal:  Neck pain Integumentary: No rash, pruritus, skin lesions Neurological: as  above Psychiatric: No depression, insomnia, anxiety Endocrine: No palpitations, fatigue, diaphoresis, mood swings, change in appetite, change in weight, increased thirst Hematologic/Lymphatic:  No purpura, petechiae. Allergic/Immunologic: no itchy/runny eyes, nasal congestion, recent allergic reactions, rashes  PHYSICAL EXAM: Blood pressure 112/82, pulse (!) 57, height 5\' 6"  (1.676 m), weight 208 lb (94.3 kg), SpO2 97 %. General: No acute distress.  Patient appears well-groomed.   Head:  Normocephalic/atraumatic Eyes:  Fundi examined but not visualized Neck: supple, bilateral suboccipital and paraspinal tenderness, full range of motion Heart:  Regular rate and rhythm Lungs:  Clear to auscultation bilaterally Back: No paraspinal tenderness Neurological Exam: alert and oriented to person, place, and time. Attention span and concentration intact, recent and remote memory intact, fund of knowledge intact.  Speech fluent and not dysarthric, language intact.  CN II-XII intact. Bulk and tone normal, muscle strength 5/5 throughout.  Sensation to pinprikc and vibration intact.  Deep tendon reflexes 2+ throughout, toes downgoing.  Finger to nose and heel to shin testing intact.  Gait normal, able to turn and tandem walk. Romberg negative.  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  We will check CTA of head to evaluate for aneurysm or other intracranial vascular abnormality. 2.  Start verapamil 80mg  three times daily.  We can increase dose by 80mg  in 4 weeks if needed. 4.  Limit use of pain relievers such as naproxen to no more than 2 days out of week if possible to prevent rebound headache.  However, while you have a constant headache, pain relievers may not be effective yet. 5.  Keep headache diary 6.  Follow up in 3 to 4 months but contact me in 4 weeks if headaches persist and we can increase dose of the verapamil  Metta Clines, DO  CC: Mackie Pai, PA-C

## 2017-09-03 ENCOUNTER — Telehealth: Payer: Self-pay

## 2017-09-03 ENCOUNTER — Encounter: Payer: Self-pay | Admitting: Neurology

## 2017-09-03 ENCOUNTER — Telehealth: Payer: Self-pay | Admitting: Medical

## 2017-09-03 ENCOUNTER — Ambulatory Visit (INDEPENDENT_AMBULATORY_CARE_PROVIDER_SITE_OTHER): Payer: 59 | Admitting: Neurology

## 2017-09-03 VITALS — BP 112/82 | HR 57 | Ht 66.0 in | Wt 208.0 lb

## 2017-09-03 DIAGNOSIS — G4453 Primary thunderclap headache: Secondary | ICD-10-CM

## 2017-09-03 MED ORDER — VERAPAMIL HCL 80 MG PO TABS: 80.0000 mg | ORAL_TABLET | Freq: Three times a day (TID) | ORAL | 2 refills | Status: DC

## 2017-09-03 NOTE — Telephone Encounter (Signed)
Copied from [place] [phone]. Topic: Quick Communication - See Telephone Encounter >> Sep 03, 2017  5:02 PM Rutherford Nail, [place] wrote: CRM for notification. See Telephone encounter for: 09/03/17. Patient calling and states that she went to see neurologist Dr Lestine Mount. States that he is wanting to do a CTA to look at the blood vessels. States that appointment will have to be scheduled through imaging office. States that he put her on a blood pressure medication- verapamil 80mg  3x a day. States that Dr Lestine Mount thinks that the patient has "Thunder cap" headaches. She is following up with him in 1 month. CB#: [phone]

## 2017-09-03 NOTE — Telephone Encounter (Signed)
Called patient to schedule ED follow up appointment. No answer. Patient does have appointment scheduled with [facility] Dr. Tomi Likens.

## 2017-09-03 NOTE — Patient Instructions (Addendum)
1.  We will check CTA of head 2.  Start verapamil 80mg  three times daily.  We can increase dose in 4 weeks if needed. 3.  Take the methocarbamol at bedtime for neck pain. 4.  Limit use of pain relievers such as naproxen to no more than 2 days out of week if possible to prevent rebound headache.  However, while you have a constant headache, pain relievers may not be effective yet. 5.  Keep headache diary 6.  Follow up in 3 to 4 months but contact me in 4 weeks if headaches persist and we can increase dose of the verapamil  We have sent a referral to [facility] for your MRI and they will call you directly to schedule your appt. They are located at [place]. If you need to contact them directly please call [phone].

## 2017-09-05 NOTE — Telephone Encounter (Signed)
  [place] but please call patient and make sure she follows through with getting the CTA done. If imaging not calling her by Monday with update on appointment then ask her to call Dr. Jaffe/neurologist office as they paced the order.

## 2017-09-07 ENCOUNTER — Telehealth: Payer: Self-pay | Admitting: Neurology

## 2017-09-07 NOTE — Telephone Encounter (Signed)
Patient said that she is having better days but not sleeping at night. She was wondering if there was a sleeping medication that she can take, "so maybe her brain can relax and she can get rid of her headaches". Please Call. Thanks

## 2017-09-07 NOTE — Telephone Encounter (Signed)
Called and spoke with Pt. Since starting th veerapamil, she is unable to sleep well. She has increased her melatonin to 9 mg, it is not helping.

## 2017-09-07 NOTE — Telephone Encounter (Signed)
Instead, start topiramate 50mg  at bedtime.  If headaches not improved in 4 weeks, she should contact us and we can increase dose to 100mg .  Numbness and tingling may be a side effect, so she shouldn't be alarmed if she experiences it.

## 2017-09-08 MED ORDER — TOPIRAMATE 50 MG PO TABS: 50.0000 mg | ORAL_TABLET | Freq: Every day | ORAL | 3 refills | Status: DC

## 2017-09-08 NOTE — Telephone Encounter (Signed)
Called and spoke with pt, advised her of 50 mg topiramate 1 QHS, possible side effects and to call in 4 wks if needed.

## 2017-09-14 DIAGNOSIS — R51 Headache: Secondary | ICD-10-CM | POA: Diagnosis not present

## 2017-09-14 DIAGNOSIS — M9901 Segmental and somatic dysfunction of cervical region: Secondary | ICD-10-CM | POA: Diagnosis not present

## 2017-09-14 DIAGNOSIS — M50122 Cervical disc disorder at C5-C6 level with radiculopathy: Secondary | ICD-10-CM | POA: Diagnosis not present

## 2017-09-15 DIAGNOSIS — M9901 Segmental and somatic dysfunction of cervical region: Secondary | ICD-10-CM | POA: Diagnosis not present

## 2017-09-15 DIAGNOSIS — R51 Headache: Secondary | ICD-10-CM | POA: Diagnosis not present

## 2017-09-15 DIAGNOSIS — M50122 Cervical disc disorder at C5-C6 level with radiculopathy: Secondary | ICD-10-CM | POA: Diagnosis not present

## 2017-09-15 DIAGNOSIS — H93233 Hyperacusis, bilateral: Secondary | ICD-10-CM | POA: Diagnosis not present

## 2017-09-15 DIAGNOSIS — H6983 Other specified disorders of Eustachian tube, bilateral: Secondary | ICD-10-CM | POA: Diagnosis not present

## 2017-09-15 DIAGNOSIS — G4453 Primary thunderclap headache: Secondary | ICD-10-CM | POA: Diagnosis not present

## 2017-09-15 DIAGNOSIS — Z0110 Encounter for examination of ears and hearing without abnormal findings: Secondary | ICD-10-CM | POA: Diagnosis not present

## 2017-09-17 DIAGNOSIS — R51 Headache: Secondary | ICD-10-CM | POA: Diagnosis not present

## 2017-09-17 DIAGNOSIS — M50122 Cervical disc disorder at C5-C6 level with radiculopathy: Secondary | ICD-10-CM | POA: Diagnosis not present

## 2017-09-17 DIAGNOSIS — M9901 Segmental and somatic dysfunction of cervical region: Secondary | ICD-10-CM | POA: Diagnosis not present

## 2017-09-22 DIAGNOSIS — M9901 Segmental and somatic dysfunction of cervical region: Secondary | ICD-10-CM | POA: Diagnosis not present

## 2017-09-22 DIAGNOSIS — M50122 Cervical disc disorder at C5-C6 level with radiculopathy: Secondary | ICD-10-CM | POA: Diagnosis not present

## 2017-09-22 DIAGNOSIS — R51 Headache: Secondary | ICD-10-CM | POA: Diagnosis not present

## 2017-09-23 ENCOUNTER — Other Ambulatory Visit: Payer: Self-pay | Admitting: Endocrinology

## 2017-09-23 DIAGNOSIS — M50122 Cervical disc disorder at C5-C6 level with radiculopathy: Secondary | ICD-10-CM | POA: Diagnosis not present

## 2017-09-23 DIAGNOSIS — R51 Headache: Secondary | ICD-10-CM | POA: Diagnosis not present

## 2017-09-23 DIAGNOSIS — M9901 Segmental and somatic dysfunction of cervical region: Secondary | ICD-10-CM | POA: Diagnosis not present

## 2017-09-30 DIAGNOSIS — M50122 Cervical disc disorder at C5-C6 level with radiculopathy: Secondary | ICD-10-CM | POA: Diagnosis not present

## 2017-09-30 DIAGNOSIS — M9901 Segmental and somatic dysfunction of cervical region: Secondary | ICD-10-CM | POA: Diagnosis not present

## 2017-09-30 DIAGNOSIS — R51 Headache: Secondary | ICD-10-CM | POA: Diagnosis not present

## 2017-10-06 DIAGNOSIS — M9901 Segmental and somatic dysfunction of cervical region: Secondary | ICD-10-CM | POA: Diagnosis not present

## 2017-10-06 DIAGNOSIS — M50122 Cervical disc disorder at C5-C6 level with radiculopathy: Secondary | ICD-10-CM | POA: Diagnosis not present

## 2017-10-06 DIAGNOSIS — R51 Headache: Secondary | ICD-10-CM | POA: Diagnosis not present

## 2017-10-09 ENCOUNTER — Other Ambulatory Visit: Payer: Self-pay | Admitting: Endocrinology

## 2017-10-26 DIAGNOSIS — M47819 Spondylosis without myelopathy or radiculopathy, site unspecified: Secondary | ICD-10-CM | POA: Diagnosis not present

## 2017-10-27 DIAGNOSIS — M50122 Cervical disc disorder at C5-C6 level with radiculopathy: Secondary | ICD-10-CM | POA: Diagnosis not present

## 2017-10-27 DIAGNOSIS — M9901 Segmental and somatic dysfunction of cervical region: Secondary | ICD-10-CM | POA: Diagnosis not present

## 2017-10-27 DIAGNOSIS — R51 Headache: Secondary | ICD-10-CM | POA: Diagnosis not present

## 2017-10-31 DIAGNOSIS — Z23 Encounter for immunization: Secondary | ICD-10-CM | POA: Diagnosis not present

## 2017-11-11 ENCOUNTER — Telehealth: Payer: Self-pay

## 2017-11-11 NOTE — Telephone Encounter (Signed)
Pt called requesting refill of Levothyroxine. Has not been seen since 2017. Advised she will need to schedule an appt for re-eval she is on the correct dosage and for lab draw. States she will call her PCP.

## 2017-11-12 ENCOUNTER — Telehealth: Payer: Self-pay | Admitting: Endocrinology

## 2017-11-12 ENCOUNTER — Other Ambulatory Visit: Payer: Self-pay

## 2017-11-12 MED ORDER — LEVOTHYROXINE SODIUM 100 MCG PO TABS: 100.0000 ug | ORAL_TABLET | Freq: Every day | ORAL | 0 refills | Status: DC

## 2017-11-12 NOTE — Telephone Encounter (Signed)
Patient stated that she has not physically been in to see Dr Loanne Drilling but said he has been checking her labs. She said she can not get her medication due to her not coming in to see him and wanted to speak to the nurse so this can be filled.  Please advise

## 2017-11-12 NOTE — Telephone Encounter (Signed)
Returned pt call. Advised she still has not been seen since 2017. Last TSH with Dr. Loanne Drilling obtained 2018. Pt is aware that once an appt with Dr. Loanne Drilling has been scheduled, a Rx will be sent for enough tablets to get her through her visit, then continued refills will continue after she has been seen by Dr. Loanne Drilling AND has current labs. Pt transferred to front desk for scheduling purposes.

## 2017-11-13 ENCOUNTER — Ambulatory Visit (INDEPENDENT_AMBULATORY_CARE_PROVIDER_SITE_OTHER): Payer: 59 | Admitting: Endocrinology

## 2017-11-13 ENCOUNTER — Encounter: Payer: Self-pay | Admitting: Endocrinology

## 2017-11-13 VITALS — BP 118/78 | HR 89 | Ht 66.0 in | Wt 205.8 lb

## 2017-11-13 DIAGNOSIS — E890 Postprocedural hypothyroidism: Secondary | ICD-10-CM

## 2017-11-13 MED ORDER — LEVOTHYROXINE SODIUM 100 MCG PO TABS: 100.0000 ug | ORAL_TABLET | Freq: Every day | ORAL | 3 refills | Status: DC

## 2017-11-13 NOTE — Progress Notes (Signed)
Subjective:    Patient ID: Karen Merritt, female    DOB: 1970-04-04, 47 y.o.   MRN: [id number]  HPI  Pt returns for f/u of hypothyroidism (pt had thyroidectomy in 1998 due to the mass effect of a multinodular goiter; CT (2019) showed status post thyroidectomy). pt states she feels well in general.  She says she never misses the synthroid.    Past Medical History:  Diagnosis Date  . Chest pain    a. non-cardiac 2005, 2012.  Marland Kitchen Hemorrhoids 11-21-2009   colonoscopy  . 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 Hypothyroidism    a. s/p thyroidectomy ~ 1998  . Palpitations     Past Surgical History:  Procedure Laterality Date  . bone spur  6/11   right big toe  . SHOULDER ARTHROSCOPY WITH SUBACROMIAL DECOMPRESSION AND OPEN ROTATOR C Right 05/23/2013   Procedure: RIGHT SHOULDER ARTHROSCOPY WITH SUBACROMIAL DECOMPRESSION AND MINI OPEN ROTATOR CUFF REPAIR, AND DISTAL CLAVICLE RESECTION;  Surgeon: Augustin Schooling, MD;  Location: [facility];  Service: Orthopedics;  Laterality: Right;  . stress cardiolite  05/23/03  . THYROIDECTOMY  12/29/96  . TOTAL ABDOMINAL HYSTERECTOMY  2008    Social History   Socioeconomic History  . Marital status: Divorced    Spouse name: Not on file  . Number of children: 2  . Years of education: 8  . Highest education level: Some college, no degree  Occupational History  . Occupation: Ecologist: [employer]  . Financial resource strain: Not on file  . Food insecurity:    Worry: Not on file    Inability: Not on file  . Transportation needs:    Medical: Not on file    Non-medical: Not on file  Tobacco Use  . Smoking status: Never Smoker  . Smokeless tobacco: Never Used  Substance and Sexual Activity  . Alcohol use: No  . Drug use: No  . Sexual activity: Not on file  Lifestyle  . Physical activity:    Days per week: Not on file    Minutes per session: Not on file  . Stress: Not on file  Relationships  .  Social connections:    Talks on phone: Not on file    Gets together: Not on file    Attends religious service: Not on file    Active member of club or organization: Not on file    Attends meetings of clubs or organizations: Not on file    Relationship status: Not on file  . Intimate partner violence:    Fear of current or ex partner: Not on file    Emotionally abused: Not on file    Physically abused: Not on file    Forced sexual activity: Not on file  Other Topics Concern  . Not on file  Social History Narrative   Lives in [place].  Active, walks/jogs.  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    Current Outpatient Medications on File Prior to Visit  Medication Sig Dispense Refill  . acetaminophen (TYLENOL) 500 MG tablet Take 500 mg by mouth every 6 (six) hours as needed for headache (pain).    . butalbital-aspirin-caffeine-codeine (ASCOMP-CODEINE) 50-325-40-30 MG capsule Take 1 capsule by mouth every 6 (six) hours as needed for headache.    Marland Kitchen  dibucaine (NUPERCAINAL) 1 % ointment Apply topically 3 (three) times daily as needed for pain. (Patient taking differently: Apply 1 application topically 3 (three) times daily as needed for pain. ) 30 g 0  . ibuprofen (ADVIL,MOTRIN) 200 MG tablet Take 400 mg by mouth every 6 (six) hours as needed for headache (pain).     . Melatonin 3 MG TABS Take 3 mg by mouth at bedtime as needed (sleep).     . methocarbamol (ROBAXIN) 500 MG tablet Take 1 tablet (500 mg total) by mouth every 8 (eight) hours as needed. 30 tablet 0  . naproxen (NAPROSYN) 500 MG tablet Take 1 tablet (500 mg total) by mouth 2 (two) times daily. 30 tablet 0  . topiramate (TOPAMAX) 50 MG tablet Take 1 tablet (50 mg total) by mouth at bedtime. 30 tablet 3  . verapamil (CALAN) 80 MG tablet Take 1 tablet (80 mg total) by mouth 3 (three) times  daily. 90 tablet 2   No current facility-administered medications on file prior to visit.     No Known Allergies  Family History  Problem Relation Age of Onset  . Hypertension Mother   . Colon polyps Mother   . Coronary artery disease Mother        alive @ 57.  Marland Kitchen Heart disease Mother 73       10/20/13  . Stroke Mother   . Heart attack Mother   . Other Father        alive and well @ 37.  Marland Kitchen Hypertension Father   . Hypertension Sister   . Migraines Brother   . Migraines Sister   . Bone cancer Maternal Aunt   . Thyroid disease Maternal Aunt   . Liver cancer Maternal Aunt   . Hypertension Maternal Grandmother   . Diabetes Maternal Grandmother   . Coronary artery disease Maternal Grandfather   . Heart attack Maternal Grandfather 60  . Coronary artery disease Maternal Uncle   . Hypertension Maternal Uncle   . Heart disease Maternal Aunt   . Glaucoma Paternal Grandmother   . Liver disease Paternal Grandfather     BP 118/78 (BP Location: Left Arm, Patient Position: Sitting, Cuff Size: Large)   Pulse 89   Ht 5\' 6"  (1.676 m)   Wt 205 lb 12.8 oz (93.4 kg)   SpO2 96%   BMI 33.22 kg/m   Review of Systems Neck pain is resolved.     Objective:   Physical Exam VITAL SIGNS:  See vs page GENERAL: no distress Neck: a healed scar is present.  I do not appreciate a nodule in the thyroid or elsewhere in the neck.   Lab Results  Component Value Date   TSH 0.44 08/24/2017      Assessment & Plan:  Hypothyroidism: well-replaced.  Please continue the same medication Palpitations, by hx.  She needs to maintain euthyroidism

## 2017-11-13 NOTE — Patient Instructions (Addendum)
Please continue the same medication. Please come back for a follow-up appointment in 1 year.

## 2017-12-07 NOTE — Progress Notes (Signed)
NEUROLOGY FOLLOW UP OFFICE NOTE  Karen Merritt [id number]  HISTORY OF PRESENT ILLNESS: Karen Merritt is a 47 year old right-handed female with secondary hypothyroidism status post thyroidectomy in 1998 who follows up for headache.  UPDATE: CTA of the head was ordered but not performed.   The original occipital pounding headache resolved.  She ran out of topiramate 50mg  two weeks ago.  1.5 weeks ago, she had a headache of severe holocephalic pressure also involving right arm and with feeling of going to pass out, lasting 10 minutes.  No preceding trigger.  She was reading a book at the time.  It occurred again last Saturday.  Frequency of abortive medication: only twice in past 2 weeks. Current NSAIDS: ibuprofen rarely Current analgesics: none Current triptans: None Current ergotamine: None Current anti-emetic: None Current muscle relaxants: none Current anti-anxiolytic: None Current sleep aide: None Current Antihypertensive medications: None Current Antidepressant medications: None Current Anticonvulsant medications: topiramate 50mg  Current anti-CGRP: None Current Vitamins/Herbal/Supplements: Melatonin Current Antihistamines/Decongestants: None Other therapy: Chiropractic  Caffeine: One cup of coffee at work Alcohol: No Smoker: No Diet: Hydrates with water.  No soda. Exercise: Water aerobics 3 days a week Depression: No; Anxiety: No Other pain:  Non-radiating low spinal back pain when she has a bowel movement Sleep hygiene: Okay  HISTORY: Onset: 08/21/2017.  Woke up in morning with occipital headache radiating into neck and shoulders. Location:  Across back of head/suboccipital region and radiates diffusely with neck pain with pain and tingling radiating down both arms. Quality:  Constant pressure with episodes of explosion Initial intensity:  Moderate to episodes of severe. Aura:  no Prodrome:  no Postdrome:  no Associated symptoms: Nausea, photophobia, phonophobia,  intermittent blurred vision, dizziness, muffled hearing.  She denies associated unilateral numbness or weakness. Initial duration:  Constant ache, exploding episodes last 10-15 seconds Initial Frequency:  Constant ache, exploding episodes occur daily Triggers/exacerbating factors:  Bending over, removing ice from her head, sudden movements Relieving factors:  Ice on top and back of head Activity:  aggravates She has past history of headaches but this is different. It is severe and persistent.  She has been to the ED on 3 occasions since onset.  CT of head without contrast performed on 08/24/17 was personally reviewed and was negative for acute bleed, stroke or mass lesion.   MRI of brain without contrast performed on 08/27/17 was personally reviewed and was normal.  On 08/31/17 she went to her chiropractor due to headache and neck pain.  She was sent to the ED for evaluation of arterial dissection.  CTA of neck from 08/31/17 was personally reviewed and was normal without evidence of dissection or other vascular abnormality.  Past NSAIDS:  Ibuprofen 400mg , IM Toradol Past analgesics:  Tylenol, Ascomp-Codeine Past abortive triptans:  Sumatriptan 50mg  (several years ago) Past abortive ergotamine:  no Past muscle relaxants:  Robaxin 500mg  Past anti-emetic:  Promethazine (several years ago) Past antihypertensive medications:  Metoprolol (palpitations), verapamil (caused insomnia) Past antidepressant medications:  no Past anticonvulsant medications: None Past anti-CGRP:  no Past vitamins/Herbal/Supplements:  no Past antihistamines/decongestants:  no Other past therapies:  no  She reports history of severe pounding headache in 2017.  This was different.  It was treated with toradol, which aborted it. Family history of headache:  No  PAST MEDICAL HISTORY: Past Medical History:  Diagnosis Date  . Chest pain    a. non-cardiac 2005, 2012.  Marland Kitchen Hemorrhoids 11-21-2009   colonoscopy  . Hypertrophy,  anal papillae 11-21-2009   colonoscopy  .  Hypothyroidism    a. s/p thyroidectomy ~ 1998  . Palpitations     MEDICATIONS: Current Outpatient Medications on File Prior to Visit  Medication Sig Dispense Refill  . acetaminophen (TYLENOL) 500 MG tablet Take 500 mg by mouth every 6 (six) hours as needed for headache (pain).    . butalbital-aspirin-caffeine-codeine (ASCOMP-CODEINE) 50-325-40-30 MG capsule Take 1 capsule by mouth every 6 (six) hours as needed for headache.    . dibucaine (NUPERCAINAL) 1 % ointment Apply topically 3 (three) times daily as needed for pain. (Patient taking differently: Apply 1 application topically 3 (three) times daily as needed for pain. ) 30 g 0  . ibuprofen (ADVIL,MOTRIN) 200 MG tablet Take 400 mg by mouth every 6 (six) hours as needed for headache (pain).     Marland Kitchen levothyroxine (SYNTHROID, LEVOTHROID) 100 MCG tablet Take 1 tablet (100 mcg total) by mouth daily. 30 tablet 3  . Melatonin 3 MG TABS Take 3 mg by mouth at bedtime as needed (sleep).     . methocarbamol (ROBAXIN) 500 MG tablet Take 1 tablet (500 mg total) by mouth every 8 (eight) hours as needed. 30 tablet 0  . naproxen (NAPROSYN) 500 MG tablet Take 1 tablet (500 mg total) by mouth 2 (two) times daily. 30 tablet 0  . topiramate (TOPAMAX) 50 MG tablet Take 1 tablet (50 mg total) by mouth at bedtime. 30 tablet 3  . verapamil (CALAN) 80 MG tablet Take 1 tablet (80 mg total) by mouth 3 (three) times daily. 90 tablet 2   No current facility-administered medications on file prior to visit.     ALLERGIES: No Known Allergies  FAMILY HISTORY: Family History  Problem Relation Age of Onset  . Hypertension Mother   . Colon polyps Mother   . Coronary artery disease Mother        alive @ 38.  Marland Kitchen Heart disease Mother 14       10/20/13  . Stroke Mother   . Heart attack Mother   . Other Father        alive and well @ 4.  Marland Kitchen Hypertension Father   . Hypertension Sister   . Migraines Brother   . Migraines Sister    . Bone cancer Maternal Aunt   . Thyroid disease Maternal Aunt   . Liver cancer Maternal Aunt   . Hypertension Maternal Grandmother   . Diabetes Maternal Grandmother   . Coronary artery disease Maternal Grandfather   . Heart attack Maternal Grandfather 60  . Coronary artery disease Maternal Uncle   . Hypertension Maternal Uncle   . Heart disease Maternal Aunt   . Glaucoma Paternal Grandmother   . Liver disease Paternal Grandfather    SOCIAL HISTORY: Social History   Socioeconomic History  . Marital status: Divorced    Spouse name: Not on file  . Number of children: 2  . Years of education: 18  . Highest education level: Some college, no degree  Occupational History  . Occupation: Ecologist: [employer]  . Financial resource strain: Not on file  . Food insecurity:    Worry: Not on file    Inability: Not on file  . Transportation needs:    Medical: Not on file    Non-medical: Not on file  Tobacco Use  . Smoking status: Never Smoker  . Smokeless tobacco: Never Used  Substance and Sexual Activity  . Alcohol use: No  . Drug use: No  . Sexual  activity: Not on file  Lifestyle  . Physical activity:    Days per week: Not on file    Minutes per session: Not on file  . Stress: Not on file  Relationships  . Social connections:    Talks on phone: Not on file    Gets together: Not on file    Attends religious service: Not on file    Active member of club or organization: Not on file    Attends meetings of clubs or organizations: Not on file    Relationship status: Not on file  . Intimate partner violence:    Fear of current or ex partner: Not on file    Emotionally abused: Not on file    Physically abused: Not on file    Forced sexual activity: Not on file  Other Topics Concern  . Not on file  Social History Narrative   Lives in [place].  Active, walks/jogs.  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   Right-handed   Caffeine:  occasionally      Was recently participating in water aerobics, though recently has not been able, due to headaches.    REVIEW OF SYSTEMS: Constitutional: No fevers, chills, or sweats, no generalized fatigue, change in appetite Eyes: No visual changes, double vision, eye pain Ear, nose and throat: No hearing loss, ear pain, nasal congestion, sore throat Cardiovascular: No chest pain, palpitations Respiratory:  No shortness of breath at rest or with exertion, wheezes GastrointestinaI: No nausea, vomiting, diarrhea, abdominal pain, fecal incontinence Genitourinary:  No dysuria, urinary retention or frequency Musculoskeletal:  No neck pain, back pain Integumentary: No rash, pruritus, skin lesions Neurological: as above Psychiatric: No depression, insomnia, anxiety Endocrine: No palpitations, fatigue, diaphoresis, mood swings, change in appetite, change in weight, increased thirst Hematologic/Lymphatic:  No purpura, petechiae. Allergic/Immunologic: no itchy/runny eyes, nasal congestion, recent allergic reactions, rashes  PHYSICAL EXAM: Blood pressure 118/76, pulse 72, height 5\' 6"  (1.676 m), weight 207 lb (93.9 kg), SpO2 94 %. General: No acute distress.  Patient appears well-groomed.  Head:  Normocephalic/atraumatic Eyes:  Fundi examined but not visualized Neck: supple, no paraspinal tenderness, full range of motion Heart:  Regular rate and rhythm Lungs:  Clear to auscultation bilaterally Back: No paraspinal tenderness Neurological Exam: alert and oriented to person, place, and time. Attention span and concentration intact, recent and remote memory intact, fund of knowledge intact.  Speech fluent and not dysarthric, language intact.  CN II-XII intact. Bulk and tone normal, muscle strength 5/5 throughout.  Sensation to light touch intact.  Deep tendon reflexes 2+ throughout, toes downgoing.  Finger to nose testing intact.  Gait normal, Romberg negative.  IMPRESSION: Unspecified headache  syndrome, possibly a variant of primary thunderclap headache.  Increased headache may be due to having ran out of topiramate 2 weeks ago.  PLAN: 1.  Restart topiramate 50mg  at bedtime.  We can increase dose to 100mg  at bedtime in 4 weeks if needed. 2.  Given these new severe headaches, we will check CTA of head to evaluate for aneurysm. 3.  Limit use of pain relievers to no more than 2 days out of week to prevent risk of rebound or medication-overuse headache. 4.  Keep headache diary 5.  Follow up in 3 to 4 months.  Metta Clines, DO  CC: Mackie Pai, PA-C

## 2017-12-08 ENCOUNTER — Ambulatory Visit (INDEPENDENT_AMBULATORY_CARE_PROVIDER_SITE_OTHER): Payer: 59 | Admitting: Neurology

## 2017-12-08 ENCOUNTER — Encounter: Payer: Self-pay | Admitting: Neurology

## 2017-12-08 VITALS — BP 118/76 | HR 72 | Ht 66.0 in | Wt 207.0 lb

## 2017-12-08 DIAGNOSIS — G4489 Other headache syndrome: Secondary | ICD-10-CM

## 2017-12-08 DIAGNOSIS — G4453 Primary thunderclap headache: Secondary | ICD-10-CM

## 2017-12-08 MED ORDER — TOPIRAMATE 50 MG PO TABS: 50.0000 mg | ORAL_TABLET | Freq: Every day | ORAL | 3 refills | Status: DC

## 2017-12-08 NOTE — Patient Instructions (Addendum)
1.  Restart topiramate 50mg  at bedtime.  We can increase dose in 4 weeks if needed. 2.  We will check CTA of head 3.  Limit use of pain relievers to no more than 2 days out of week to prevent risk of rebound or medication-overuse headache. 4.  Keep headache diary 5.  Follow up in 3 to 4 months.  We have sent a referral to [facility] for your MRI and they will call you directly to schedule your appt. They are located at [place]. If you need to contact them directly please call [phone].

## 2017-12-21 ENCOUNTER — Other Ambulatory Visit: Payer: 59

## 2017-12-24 ENCOUNTER — Inpatient Hospital Stay: Admission: RE | Admit: 2017-12-24 | Payer: 59 | Source: Ambulatory Visit

## 2017-12-25 ENCOUNTER — Other Ambulatory Visit: Payer: Self-pay | Admitting: Neurology

## 2017-12-25 ENCOUNTER — Other Ambulatory Visit: Payer: Self-pay | Admitting: Physician Assistant

## 2017-12-25 DIAGNOSIS — G4453 Primary thunderclap headache: Secondary | ICD-10-CM

## 2017-12-28 ENCOUNTER — Ambulatory Visit
Admission: RE | Admit: 2017-12-28 | Discharge: 2017-12-28 | Disposition: A | Discharge status: Home / Self Care | Payer: 59 | Source: Ambulatory Visit | Attending: Neurology | Admitting: Neurology

## 2017-12-28 DIAGNOSIS — R51 Headache: Secondary | ICD-10-CM | POA: Diagnosis not present

## 2017-12-28 DIAGNOSIS — G4453 Primary thunderclap headache: Secondary | ICD-10-CM

## 2017-12-28 MED ORDER — IOPAMIDOL (ISOVUE-370) INJECTION 76%
75.0000 mL | Freq: Once | INTRAVENOUS | Status: AC | PRN
  Administered 2017-12-28: 75 mL via INTRAVENOUS

## 2017-12-29 ENCOUNTER — Telehealth: Payer: Self-pay

## 2017-12-29 NOTE — Telephone Encounter (Signed)
-----   Message from Alda Berthold, DO sent at 12/28/2017  4:29 PM EST ----- Please inform patient her vessel imaging is normal. Thanks.

## 2017-12-29 NOTE — Telephone Encounter (Signed)
Called Pt, advised her of imaging results

## 2017-12-31 ENCOUNTER — Other Ambulatory Visit: Payer: 59

## 2018-04-26 ENCOUNTER — Ambulatory Visit: Payer: 59 | Admitting: Neurology

## 2018-09-19 ENCOUNTER — Other Ambulatory Visit: Payer: Self-pay | Admitting: Endocrinology

## 2018-11-11 ENCOUNTER — Other Ambulatory Visit: Payer: Self-pay

## 2018-11-15 ENCOUNTER — Ambulatory Visit (INDEPENDENT_AMBULATORY_CARE_PROVIDER_SITE_OTHER): Payer: 59 | Admitting: Endocrinology

## 2018-11-15 ENCOUNTER — Other Ambulatory Visit: Payer: Self-pay

## 2018-11-15 ENCOUNTER — Encounter: Payer: Self-pay | Admitting: Endocrinology

## 2018-11-15 VITALS — BP 124/70 | HR 70 | Ht 66.0 in | Wt 207.6 lb

## 2018-11-15 DIAGNOSIS — E890 Postprocedural hypothyroidism: Secondary | ICD-10-CM

## 2018-11-15 LAB — TSH: TSH: 2.37 u[IU]/mL (ref 0.35–4.50)

## 2018-11-15 LAB — T4, FREE: Free T4: 0.92 ng/dL (ref 0.60–1.60)

## 2018-11-15 NOTE — Patient Instructions (Addendum)
Blood tests are requested for you today.  We'll let you know about the results.  It is best to never miss the medication.  However, if you do miss it, next best is to double up the next time.  Please come back for a follow-up appointment in 1 year.   

## 2018-11-15 NOTE — Progress Notes (Signed)
Subjective:    Patient ID: Karen Merritt, female    DOB: Apr 28, 1970, 48 y.o.   MRN: [id number]  HPI Pt returns for f/u of postsurgical hypothyroidism (pt had thyroidectomy in 1998 due to the mass effect of a multinodular goiter; CT (2019) showed status post thyroidectomy). pt states she feels well in general.  She says she never misses the synthroid.  Past Medical History:  Diagnosis Date  . Chest pain    a. non-cardiac 2005, 2012.  Marland Kitchen Hemorrhoids 11-21-2009   colonoscopy  . 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 Hypothyroidism    a. s/p thyroidectomy ~ 1998  . Palpitations     Past Surgical History:  Procedure Laterality Date  . bone spur  6/11   right big toe  . SHOULDER ARTHROSCOPY WITH SUBACROMIAL DECOMPRESSION AND OPEN ROTATOR C Right 05/23/2013   Procedure: RIGHT SHOULDER ARTHROSCOPY WITH SUBACROMIAL DECOMPRESSION AND MINI OPEN ROTATOR CUFF REPAIR, AND DISTAL CLAVICLE RESECTION;  Surgeon: Augustin Schooling, MD;  Location: [facility];  Service: Orthopedics;  Laterality: Right;  . stress cardiolite  05/23/03  . THYROIDECTOMY  12/29/96  . TOTAL ABDOMINAL HYSTERECTOMY  2008    Social History   Socioeconomic History  . Marital status: Divorced    Spouse name: Not on file  . Number of children: 2  . Years of education: 68  . Highest education level: Some college, no degree  Occupational History  . Occupation: Ecologist: [employer]  . Financial resource strain: Not on file  . Food insecurity    Worry: Not on file    Inability: Not on file  . Transportation needs    Medical: Not on file    Non-medical: Not on file  Tobacco Use  . Smoking status: Never Smoker  . Smokeless tobacco: Never Used  Substance and Sexual Activity  . Alcohol use: No  . Drug use: No  . Sexual activity: Not on file  Lifestyle  . Physical activity    Days per week: Not on file    Minutes per session: Not on file  . Stress: Not on file  Relationships   . Social Herbalist on phone: Not on file    Gets together: Not on file    Attends religious service: Not on file    Active member of club or organization: Not on file    Attends meetings of clubs or organizations: Not on file    Relationship status: Not on file  . Intimate partner violence    Fear of current or ex partner: Not on file    Emotionally abused: Not on file    Physically abused: Not on file    Forced sexual activity: Not on file  Other Topics Concern  . Not on file  Social History Narrative   Lives in [place].  Active, walks/jogs.  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    Current Outpatient Medications on File Prior to Visit  Medication Sig Dispense Refill  . acetaminophen (TYLENOL) 500 MG tablet Take 500 mg by mouth every 6 (six) hours as needed for headache (pain).    . dibucaine (NUPERCAINAL) 1 % ointment Apply topically 3 (three) times daily as needed for pain. (Patient taking differently: Apply 1 application topically 3 (three)  times daily as needed for pain. ) 30 g 0  . ibuprofen (ADVIL,MOTRIN) 200 MG tablet Take 400 mg by mouth every 6 (six) hours as needed for headache (pain).     Marland Kitchen levothyroxine (SYNTHROID) 100 MCG tablet TAKE 1 TABLET BY MOUTH  DAILY 90 tablet 3  . Melatonin 3 MG TABS Take 3 mg by mouth at bedtime as needed (sleep).      No current facility-administered medications on file prior to visit.     No Known Allergies  Family History  Problem Relation Age of Onset  . Hypertension Mother   . Colon polyps Mother   . Coronary artery disease Mother        alive @ 59.  Marland Kitchen Heart disease Mother 35       10/20/13  . Stroke Mother   . Heart attack Mother   . Other Father        alive and well @ 44.  Marland Kitchen Hypertension Father   . Hypertension Sister   . Migraines Brother   . Migraines Sister   .  Bone cancer Maternal Aunt   . Thyroid disease Maternal Aunt   . Liver cancer Maternal Aunt   . Hypertension Maternal Grandmother   . Diabetes Maternal Grandmother   . Coronary artery disease Maternal Grandfather   . Heart attack Maternal Grandfather 60  . Coronary artery disease Maternal Uncle   . Hypertension Maternal Uncle   . Heart disease Maternal Aunt   . Glaucoma Paternal Grandmother   . Liver disease Paternal Grandfather     BP 124/70 (BP Location: Left Arm, Patient Position: Sitting, Cuff Size: Large)   Pulse 70   Ht 5\' 6"  (1.676 m)   Wt 207 lb 9.6 oz (94.2 kg)   SpO2 98%   BMI 33.51 kg/m    Review of Systems Denies neck swelling and pain    Objective:   Physical Exam VITAL SIGNS:  See vs page GENERAL: no distress Neck: a healed scar is present.  I do not appreciate a nodule in the thyroid or elsewhere in the neck   Lab Results  Component Value Date   TSH 2.37 11/15/2018      Assessment & Plan:  Hypothyroidism: well-replaced MNG: no clinical evidence of recurrence.   Patient Instructions  Blood tests are requested for you today.  We'll let you know about the results.  It is best to never miss the medication.  However, if you do miss it, next best is to double up the next time.   Please come back for a follow-up appointment in 1 year.

## 2019-03-18 ENCOUNTER — Ambulatory Visit: Payer: 59 | Attending: Internal Medicine

## 2019-03-18 DIAGNOSIS — Z20822 Contact with and (suspected) exposure to covid-19: Secondary | ICD-10-CM

## 2019-03-19 LAB — NOVEL CORONAVIRUS, NAA: SARS-CoV-2, NAA: NOT DETECTED

## 2019-03-25 ENCOUNTER — Ambulatory Visit: Payer: 59 | Attending: Internal Medicine

## 2019-03-25 DIAGNOSIS — Z20822 Contact with and (suspected) exposure to covid-19: Secondary | ICD-10-CM

## 2019-03-26 LAB — NOVEL CORONAVIRUS, NAA: SARS-CoV-2, NAA: NOT DETECTED

## 2019-08-09 ENCOUNTER — Other Ambulatory Visit: Payer: Self-pay | Admitting: Endocrinology

## 2019-08-23 IMAGING — MR MR HEAD W/O CM
10 series · 48 of 48 positions shown · non-contrast
Comparison: 08/24/2017 CT head.

CLINICAL DATA: 47 y/o  F; worst headache of life.

EXAM:
MRI HEAD WITHOUT CONTRAST
TECHNIQUE: Multiplanar, multiecho pulse sequences of the brain and surrounding
structures were obtained without intravenous contrast.

[Series 2: t1_se_sag · sagittal · 5.0mm · 0.45mm/px · 3 of 21 slices shown]
[im 1/21]
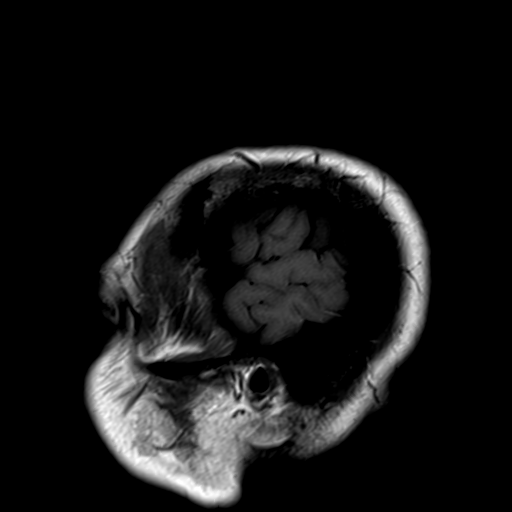
[im 11/21]
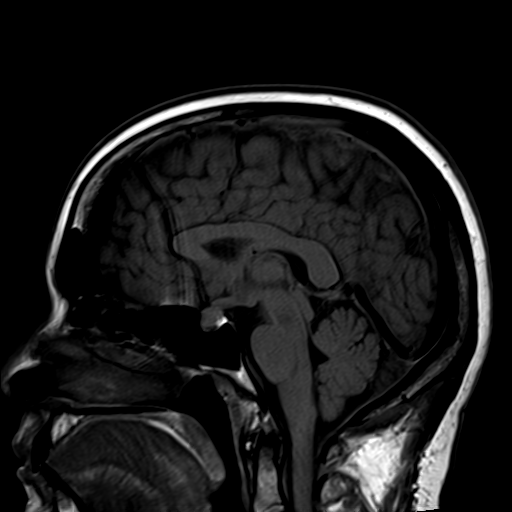
[im 21/21]
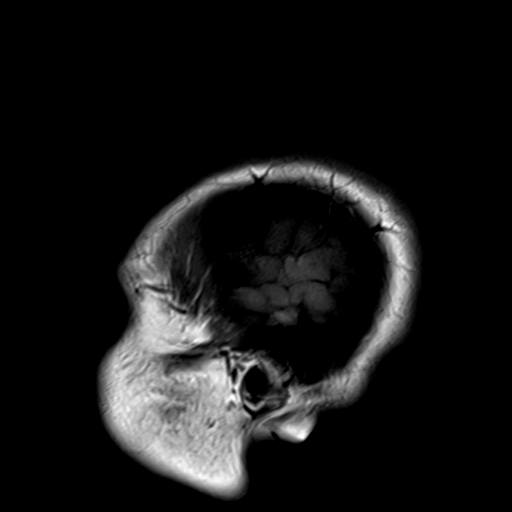

[Series 3: ep2d_diff_(id)_trace · axial · 3.0mm · 1.80mm/px · z∈[-67,+66]mm · 8 of 92 slices shown]
[im 1/92]
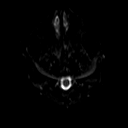
[im 14/92]
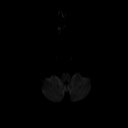
[im 27/92]
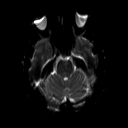
[im 40/92]
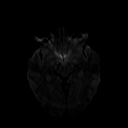
[im 53/92]
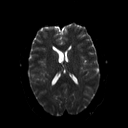
[im 66/92]
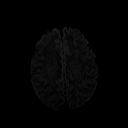
[im 79/92]
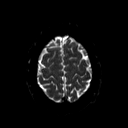
[im 92/92]
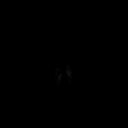

[Series 4: ep2d_diff_(id)_trace_adc · axial · 3.0mm · 1.80mm/px · z∈[-67,+66]mm · 4 of 46 slices shown]
[im 1/46]
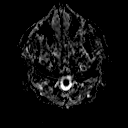
[im 16/46]
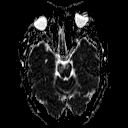
[im 31/46]
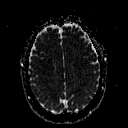
[im 46/46]
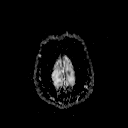

[Series 5: ep2d_diff_cor · coronal · 5.0mm · 1.77mm/px · 4 of 48 slices shown]
[im 1/48]
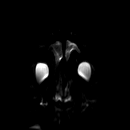
[im 16/48]
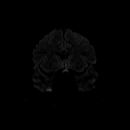
[im 32/48]
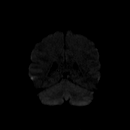
[im 48/48]
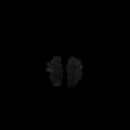

[Series 6: ep2d_diff_cor_adc · coronal · 5.0mm · 1.77mm/px · 2 of 25 slices shown]
[im 1/25]
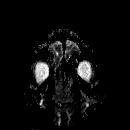
[im 25/25]
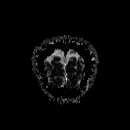

[Series 8: swi_images · axial · 2.0mm · 0.90mm/px · z∈[-70,+70]mm · 7 of 72 slices shown]
[im 1/72]
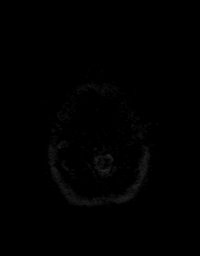
[im 12/72]
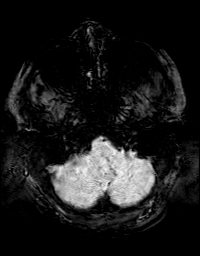
[im 24/72]
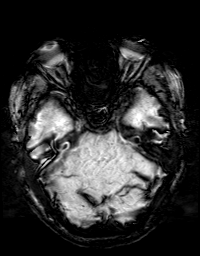
[im 36/72]
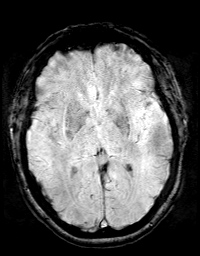
[im 48/72]
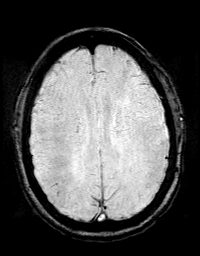
[im 60/72]
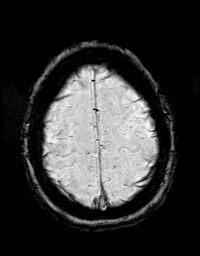
[im 72/72]
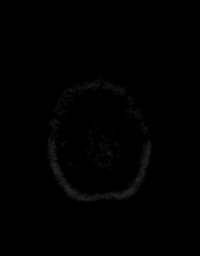

[Series 9: t2_tse_tra_512 · axial · 5.0mm · 0.60mm/px · z∈[-68,+68]mm · 2 of 24 slices shown]
[im 1/24]
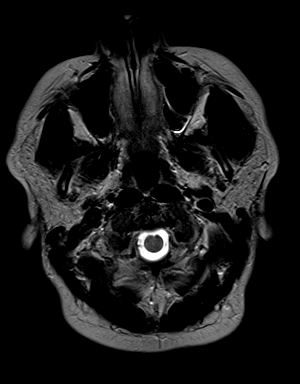
[im 24/24]
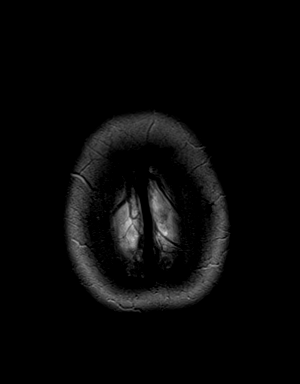

[Series 10: FLAIR · axial · 3.0mm · 0.43mm/px · z∈[-69,+68]mm · 3 of 30 slices shown]
[im 1/30]
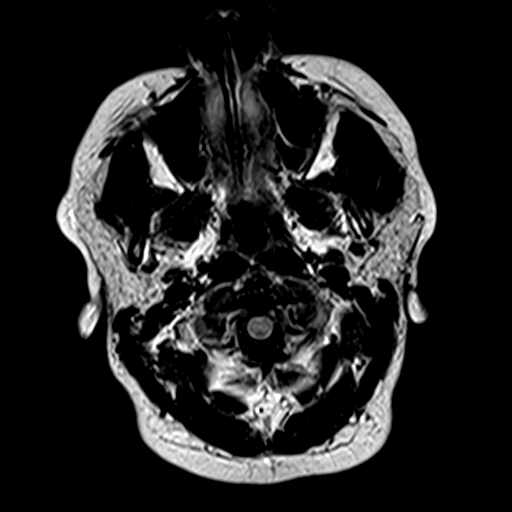
[im 15/30]
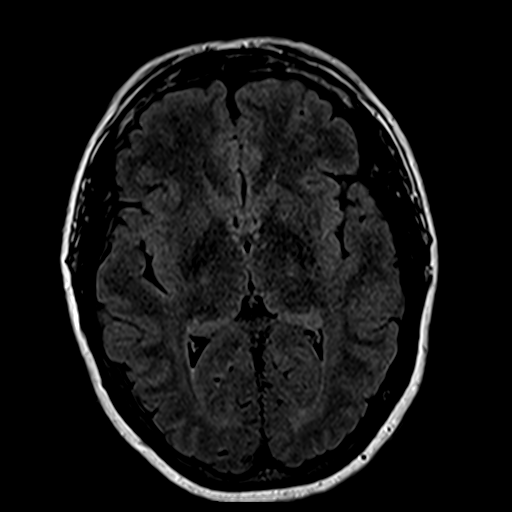
[im 30/30]
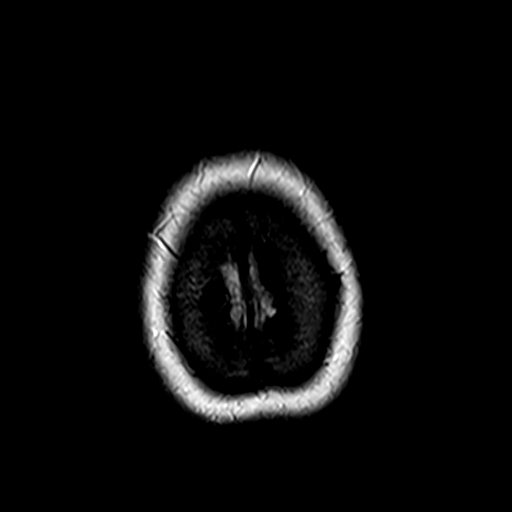

[Series 11: t1_mpr_tra · axial · 1.0mm · 0.72mm/px · z∈[-70,+71]mm · 13 of 144 slices shown]
[im 1/144]
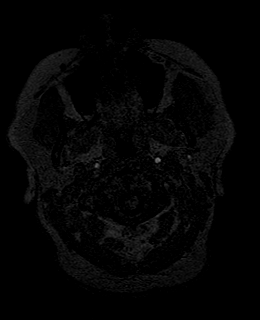
[im 12/144]
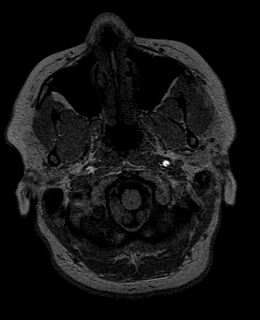
[im 24/144]
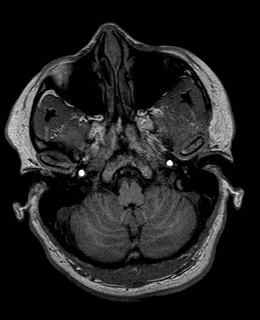
[im 36/144]
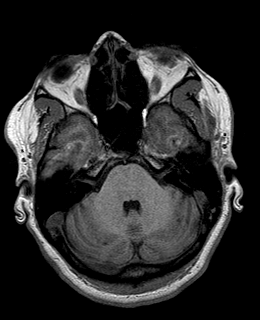
[im 48/144]
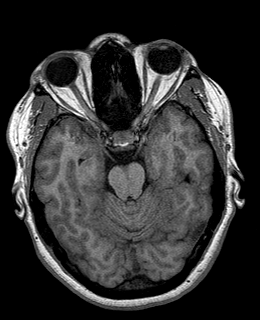
[im 60/144]
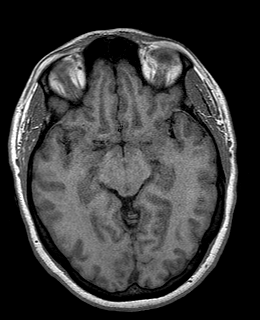
[im 72/144]
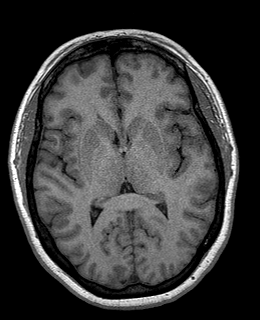
[im 84/144]
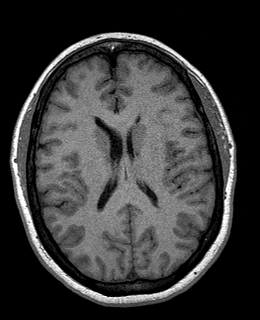
[im 96/144]
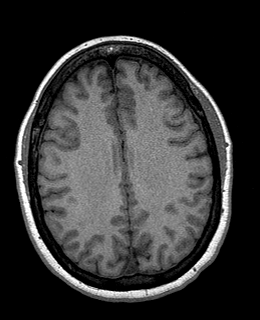
[im 108/144]
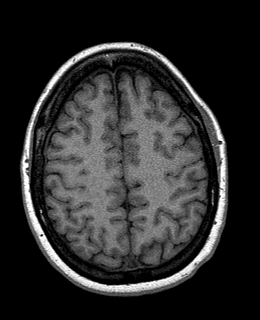
[im 120/144]
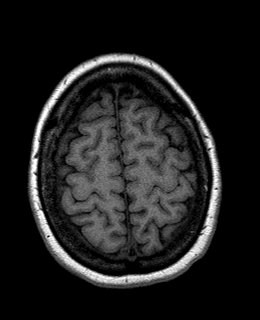
[im 132/144]
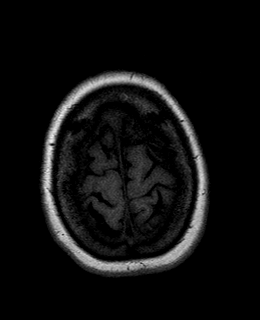
[im 144/144]
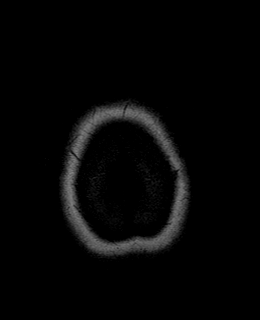

[Series 12: T2 · coronal · 5.0mm · 0.45mm/px · 2 of 26 slices shown]
[im 1/26]
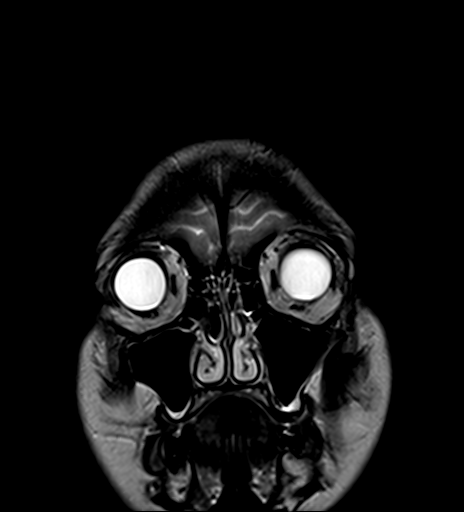
[im 26/26]
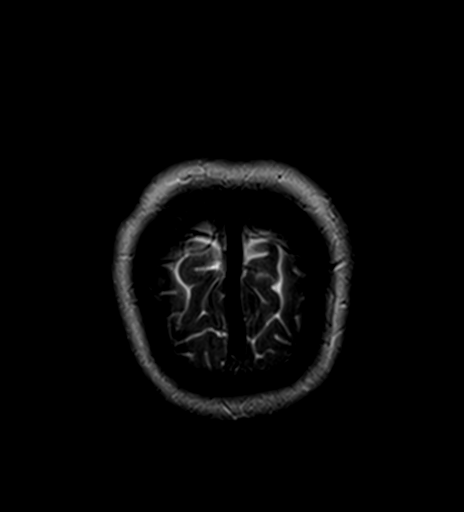

[48 of 48 positions shown; findings below may reference images not displayed]

FINDINGS: Brain: No acute infarction, hemorrhage, hydrocephalus, extra-axial
collection or mass lesion.

Vascular: Normal flow voids.

Skull and upper cervical spine: Normal marrow signal.

Sinuses/Orbits: Negative.

Other: None.
IMPRESSION: Normal MRI of the brain.

By: Stefano Chiu M.D.

## 2019-09-19 ENCOUNTER — Encounter: Payer: Self-pay | Admitting: Medical

## 2019-09-20 ENCOUNTER — Ambulatory Visit (INDEPENDENT_AMBULATORY_CARE_PROVIDER_SITE_OTHER): Payer: 59 | Admitting: Family

## 2019-09-20 ENCOUNTER — Other Ambulatory Visit: Payer: Self-pay

## 2019-09-20 ENCOUNTER — Encounter: Payer: Self-pay | Admitting: Family

## 2019-09-20 VITALS — BP 120/80 | HR 62 | Resp 16 | Ht 66.0 in | Wt 203.0 lb

## 2019-09-20 DIAGNOSIS — S060X0A Concussion without loss of consciousness, initial encounter: Secondary | ICD-10-CM

## 2019-09-20 NOTE — Progress Notes (Signed)
Subjective:    Patient ID: Karen Merritt, female    DOB: 15-Feb-1970, 49 y.o.   MRN: [id number]  HPI   Patient is a 49 yr old female who presents today to discuss a head injury that occurred on 09/10/19. Patient was seated on the ground and her bulldog jumped at her to lick her face and she fell backwards striking the back of her head on her child's plastic picnic table.  She states that she had immediate pain in the back of her head with throbbing. Initial pain was described as throbbing and severe.  She had no LOC.    Since the injury she has had intermittent nausea, facial tingling and tingling in both arms.   She denies vomiting or changes in vision. She does feel pressure behind her eyes and has had head "pressure."    Reports that the area has been tender but she has had no associated swelling.   Review of Systems See HPI  Past Medical History:  Diagnosis Date  . Chest pain    a. non-cardiac 2005, 2012.  Marland Kitchen Hemorrhoids 11-21-2009   colonoscopy  . 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 Hypothyroidism    a. s/p thyroidectomy ~ 1998  . Palpitations      Social History   Socioeconomic History  . Marital status: Divorced    Spouse name: Not on file  . Number of children: 2  . Years of education: 58  . Highest education level: Some college, no degree  Occupational History  . 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 Smoking status: Never Smoker  . Smokeless tobacco: Never Used  Substance and Sexual Activity  . Alcohol use: No  . Drug use: No  . Sexual activity: Not on file  Other Topics Concern  . Not on file  Social History Narrative   Lives in [place].  Active, walks/jogs.  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   Social Determinants of Health   Financial Resource Strain:   .  Difficulty of Paying Living Expenses: Not on file  Food Insecurity:   . Worried About Charity fundraiser in the Last Year: Not on file  . Ran Out of Food in the Last Year: Not on file  Transportation Needs:   . Lack of Transportation (Medical): Not on file  . Lack of Transportation (Non-Medical): Not on file  Physical Activity:   . Days of Exercise per Week: Not on file  . Minutes of Exercise per Session: Not on file  Stress:   . Feeling of Stress : Not on file  Social Connections:   . Frequency of Communication with Friends and Family: Not on file  . Frequency of Social Gatherings with Friends and Family: Not on file  . Attends Religious Services: Not on file  . Active Member of Clubs or Organizations: Not on file  . Attends Archivist Meetings: Not on file  . Marital Status: Not on file  Intimate Partner Violence:   . Fear of Current or Ex-Partner: Not on file  . Emotionally Abused: Not on file  . Physically Abused: Not on file  . Sexually Abused: Not on file    Past Surgical History:  Procedure Laterality Date  . bone spur  6/11  right big toe  . SHOULDER ARTHROSCOPY WITH SUBACROMIAL DECOMPRESSION AND OPEN ROTATOR C Right 05/23/2013   Procedure: RIGHT SHOULDER ARTHROSCOPY WITH SUBACROMIAL DECOMPRESSION AND MINI OPEN ROTATOR CUFF REPAIR, AND DISTAL CLAVICLE RESECTION;  Surgeon: Augustin Schooling, MD;  Location: [facility];  Service: Orthopedics;  Laterality: Right;  . stress cardiolite  05/23/03  . THYROIDECTOMY  12/29/96  . TOTAL ABDOMINAL HYSTERECTOMY  2008    Family History  Problem Relation Age of Onset  . Hypertension Mother   . Colon polyps Mother   . Coronary artery disease Mother        alive @ 10.  Marland Kitchen Heart disease Mother 104       10/20/13  . Stroke Mother   . Heart attack Mother   . Other Father        alive and well @ 65.  Marland Kitchen Hypertension Father   . Hypertension Sister   . Migraines Brother   . Migraines Sister   . Bone cancer Maternal Aunt   . Thyroid  disease Maternal Aunt   . Liver cancer Maternal Aunt   . Hypertension Maternal Grandmother   . Diabetes Maternal Grandmother   . Coronary artery disease Maternal Grandfather   . Heart attack Maternal Grandfather 60  . Coronary artery disease Maternal Uncle   . Hypertension Maternal Uncle   . Heart disease Maternal Aunt   . Glaucoma Paternal Grandmother   . Liver disease Paternal Grandfather     No Known Allergies  Current Outpatient Medications on File Prior to Visit  Medication Sig Dispense Refill  . acetaminophen (TYLENOL) 500 MG tablet Take 500 mg by mouth every 6 (six) hours as needed for headache (pain).    . dibucaine (NUPERCAINAL) 1 % ointment Apply topically 3 (three) times daily as needed for pain. (Patient taking differently: Apply 1 application topically 3 (three) times daily as needed for pain. ) 30 g 0  . ibuprofen (ADVIL,MOTRIN) 200 MG tablet Take 400 mg by mouth every 6 (six) hours as needed for headache (pain).     Marland Kitchen levothyroxine (SYNTHROID) 100 MCG tablet TAKE 1 TABLET BY MOUTH  DAILY 90 tablet 0  . Melatonin 3 MG TABS Take 3 mg by mouth at bedtime as needed (sleep).      No current facility-administered medications on file prior to visit.    BP 120/80 (BP Location: Left Arm, Patient Position: Sitting, Cuff Size: Normal)   Pulse 62   Resp 16   Ht 5\' 6"  (1.676 m)   Wt 203 lb (92.1 kg)   SpO2 98%   BMI 32.77 kg/m       Objective:   Physical Exam Constitutional:      Appearance: She is well-developed.  HENT:     Head: Normocephalic and atraumatic.  Eyes:     Extraocular Movements: Extraocular movements intact.     Pupils: Pupils are equal, round, and reactive to light.  Neck:     Thyroid: No thyromegaly.  Cardiovascular:     Rate and Rhythm: Normal rate and regular rhythm.     Heart sounds: Normal heart sounds. No murmur heard.   Pulmonary:     Effort: Pulmonary effort is normal. No respiratory distress.     Breath sounds: Normal breath sounds. No  wheezing.  Musculoskeletal:     Cervical back: Neck supple.  Skin:    General: Skin is warm and dry.  Neurological:     Mental Status: She is alert and oriented to person, place,  and time.     Cranial Nerves: Cranial nerves are intact.     Motor: Motor function is intact.     Deep Tendon Reflexes:     Reflex Scores:      Patellar reflexes are 2+ on the right side and 2+ on the left side. Psychiatric:        Behavior: Behavior normal.        Thought Content: Thought content normal.        Judgment: Judgment normal.           Assessment & Plan:  Concussion- pt is not on anticoagulants or aspirin therapy. She struck her head from a close distance.  She has no neurologic changes at this time. Advised her the HA and nausea are common following concussion.  At this point her exam and history is reassuring so I do not think that CT is necessary. Pt is advised as follows:  25 minutes spent on today's visit reviewing chart, interviewing patient and counseling patient.  Please call if you develop vision changes, vomiting or if your symptoms are not improved in 2 weeks.

## 2019-09-20 NOTE — Patient Instructions (Signed)
Please call if you develop vision changes, vomiting or if your symptoms are not improved in 2 weeks.  Concussion, Adult  A concussion is a brain injury from a hard, direct hit (trauma) to the head or body. This direct hit causes the brain to shake quickly back and forth inside the skull. This can damage brain cells and cause chemical changes in the brain. A concussion may also be known as a mild traumatic brain injury (TBI). Concussions are usually not life-threatening, but the effects of a concussion can be serious. If you have a concussion, you should be very careful to avoid having a second concussion. What are the causes? This condition is caused by:  A direct hit to your head, such as: ? Running into another player during a game. ? Being hit in a fight. ? Hitting your head on a hard surface.  Sudden movement of your body that causes your brain to move back and forth inside the skull, such as in a car crash. What are the signs or symptoms? The signs of a concussion can be hard to notice. Early on, they may be missed by you, family members, and health care providers. You may look fine on the outside but may act or feel differently. Symptoms are usually temporary and most often improve in 7-10 days. Some symptoms appear right away, but other symptoms may not show up for hours or days. If your symptoms last longer than normal, you may have post-concussion syndrome. Every head injury is different. Physical symptoms  Headaches. This can include a feeling of pressure in the head or migraine-like symptoms.  Tiredness (fatigue).  Dizziness.  Problems with coordination or balance.  Vision or hearing problems.  Sensitivity to light or noise.  Nausea or vomiting.  Changes in eating or sleeping patterns.  Numbness or tingling.  Seizure. Mental and emotional symptoms  Memory problems.  Trouble concentrating, organizing, or making decisions.  Slowness in thinking, acting or  reacting, speaking, or reading.  Irritability or mood changes.  Anxiety or depression. How is this diagnosed? This condition is diagnosed based on:  Your symptoms.  A description of your injury. You may also have tests, including:  Imaging tests, such as a CT scan or MRI.  Neuropsychological tests. These measure your thinking, understanding, learning, and remembering abilities. How is this treated? Treatment for this condition includes:  Stopping sports or activity if you are injured. If you hit your head or show signs of concussion: ? Do not return to sports or activities the same day. ? Get checked by a health care provider before you return to your activities.  Physical and mental rest and careful observation, usually at home. Gradually return to your normal activities.  Medicines to help with symptoms such as headaches, nausea, or difficulty sleeping. ? Avoid taking opioid pain medicine while recovering from a concussion.  Avoiding alcohol and drugs. These may slow your recovery and can put you at risk of further injury.  Referral to a concussion clinic or rehabilitation center. Recovery from a concussion can take time. How fast you recover depends on many factors. Return to activities only when:  Your symptoms are completely gone.  Your health care provider says that it is safe. Follow these instructions at home: Activity  Limit activities that require a lot of thought or concentration, such as: ? Doing homework or job-related work. ? Watching TV. ? Working on the computer or phone. ? Playing memory games and puzzles.  Rest. Rest helps  your brain heal. Make sure you: ? Get plenty of sleep. Most adults should get 7-9 hours of sleep each night. ? Rest during the day. Take naps or rest breaks when you feel tired.  Avoid physical activity like exercise until your health care provider says it is safe. Stop any activity that worsens symptoms.  Do not do high-risk  activities that could cause a second concussion, such as riding a bike or playing sports.  Ask your health care provider when you can return to your normal activities, such as school, work, athletics, and driving. Your ability to react may be slower after a brain injury. Never do these activities if you are dizzy. Your health care provider will likely give you a plan for gradually returning to activities. General instructions   Take over-the-counter and prescription medicines only as told by your health care provider. Some medicines, such as blood thinners (anticoagulants) and aspirin, may increase the risk for complications, such as bleeding.  Do not drink alcohol until your health care provider says you can.  Watch your symptoms and tell others around you to do the same. Complications sometimes occur after a concussion. Older adults with a brain injury may have a higher risk of serious complications.  Tell your work Freight forwarder, teachers, Government social research officer, school counselor, coach, or Product/process development scientist about your injury, symptoms, and restrictions.  Keep all follow-up visits as told by your health care provider. This is important. How is this prevented? Avoiding another brain injury is very important. In rare cases, another injury can lead to permanent brain damage, brain swelling, or death. The risk of this is greatest during the first 7-10 days after a head injury. Avoid injuries by:  Stopping activities that could lead to a second concussion, such as contact or recreational sports, until your health care provider says it is okay.  Taking these actions once you have returned to sports or activities: ? Avoiding plays or moves that can cause you to crash into another person. This is how most concussions occur. ? Following the rules and being respectful of other players. Do not engage in violent or illegal plays.  Getting regular exercise that includes strength and balance training.  Wearing a  properly fitting helmet during sports, biking, or other activities. Helmets can help protect you from serious skull and brain injuries, but they do not protect you from a concussion. Even when wearing a helmet, you should avoid being hit in the head. Contact a health care provider if:  Your symptoms get worse or they do not improve.  You have new symptoms.  You have another injury. Get help right away if:  You have severe or worsening headaches.  You have weakness or numbness in any part of your body.  You are confused.  Your coordination gets worse.  You vomit repeatedly.  You are sleepier than normal.  Your speech is slurred.  You cannot recognize people or places.  You have a seizure.  It is difficult to wake you up.  You have unusual behavior changes.  You have changes in your vision.  You lose consciousness. Summary  A concussion is a brain injury that results from a hard, direct hit (trauma) to your head or body.  You may have imaging tests and neuropsychological tests to diagnose a concussion.  Treatment for this condition includes physical and mental rest and careful observation.  Ask your health care provider when you can return to your normal activities, such as school, work, athletics,  and driving.  Get help right away if you have a severe headache, weakness on one side of the body, seizures, behavior changes, changes in vision, or if you are confused or sleepier than normal. This information is not intended to replace advice given to you by your health care provider. Make sure you discuss any questions you have with your health care provider. Document Revised: 08/27/2017 Document Reviewed: 08/27/2017 Elsevier Patient Education  [place].

## 2019-10-10 ENCOUNTER — Ambulatory Visit (INDEPENDENT_AMBULATORY_CARE_PROVIDER_SITE_OTHER): Payer: 59 | Admitting: Medical

## 2019-10-10 ENCOUNTER — Telehealth: Payer: Self-pay | Admitting: Medical

## 2019-10-10 ENCOUNTER — Encounter: Payer: Self-pay | Admitting: Medical

## 2019-10-10 ENCOUNTER — Other Ambulatory Visit: Payer: Self-pay

## 2019-10-10 ENCOUNTER — Ambulatory Visit: Payer: 59 | Attending: Internal Medicine

## 2019-10-10 VITALS — BP 136/80 | HR 69 | Resp 18 | Ht 66.0 in | Wt 201.8 lb

## 2019-10-10 DIAGNOSIS — S060X0A Concussion without loss of consciousness, initial encounter: Secondary | ICD-10-CM

## 2019-10-10 DIAGNOSIS — Z23 Encounter for immunization: Secondary | ICD-10-CM

## 2019-10-10 DIAGNOSIS — K6289 Other specified diseases of anus and rectum: Secondary | ICD-10-CM

## 2019-10-10 DIAGNOSIS — R519 Headache, unspecified: Secondary | ICD-10-CM

## 2019-10-10 DIAGNOSIS — S0990XD Unspecified injury of head, subsequent encounter: Secondary | ICD-10-CM | POA: Diagnosis not present

## 2019-10-10 MED ORDER — CYCLOBENZAPRINE HCL 5 MG PO TABS: 5.0000 mg | ORAL_TABLET | Freq: Every day | ORAL | 0 refills | Status: DC

## 2019-10-10 MED ORDER — KETOROLAC TROMETHAMINE 60 MG/2ML IM SOLN
60.0000 mg | Freq: Once | INTRAMUSCULAR | Status: AC
Start: 2019-10-10 — End: 2019-10-10
  Administered 2019-10-10: 60 mg via INTRAMUSCULAR

## 2019-10-10 MED ORDER — DIBUCAINE 1 % EX OINT: TOPICAL_OINTMENT | Freq: Three times a day (TID) | CUTANEOUS | 0 refills | Status: DC | PRN

## 2019-10-10 NOTE — Progress Notes (Signed)
   Covid-19 Vaccination Clinic  Name:  Chantale Leugers    MRN: [id number] DOB: [birth year]  10/10/2019  Ms. Kubicek was observed post Covid-19 immunization for 15 minutes without incident. She was provided with Vaccine Information Sheet and instruction to access the V-Safe system. Vaccinated by Harriet Pho  Ms. Puskarich was instructed to call 911 with any severe reactions post vaccine: Marland Kitchen Difficulty breathing  . Swelling of face and throat  . A fast heartbeat  . A bad rash all over body  . Dizziness and weakness

## 2019-10-10 NOTE — Telephone Encounter (Signed)
Will you try to get ct of head prior authorized.

## 2019-10-10 NOTE — Progress Notes (Signed)
Subjective:    Patient ID: Karen Merritt, female    DOB: 06/02/1970, 49 y.o.   MRN: [id number]  HPI  Pt in for follow up on ha. Pt fell and hit head about 4 weeks ago pre [facility] note but pt states 5 weeks ago.  "HPI from [facility] note. Patient is a 49 yr old female who presents today to discuss a head injury that occurred on 09/10/19. Patient was seated on the ground and her bulldog jumped at her to lick her face and she fell backwards striking the back of her head on her child's plastic picnic table.  She states that she had immediate pain in the back of her head with throbbing. Initial pain was described as throbbing and severe.  She had no LOC.    Since the injury she has had intermittent nausea, facial tingling and tingling in both arms.   She denies vomiting or changes in vision. She does feel pressure behind her eyes and has had head "pressure."    Reports that the area has been tender but she has had no associated swelling."   A/P below from last visit  "Concussion- pt is not on anticoagulants or aspirin therapy. She struck her head from a close distance.  She has no neurologic changes at this time. Advised her the HA and nausea are common following concussion.  At this point her exam and history is reassuring so I do not think that CT is necessary. Pt is advised as follows:"   Pt states she has persistent occipital area pain. Pain is steady 5/10. No nausea, vomiting, no dizziness, no gross motor or sensnsory function deficits.   Pt is taking one advil or tylenol and did not help much.  Pt clarifies when she did fall did not loose consciousness.  Pt has concern for bleed.     Review of Systems  Constitutional: Negative for chills, fatigue and fever.  Respiratory: Negative for cough, chest tightness, shortness of breath and wheezing.   Cardiovascular: Negative for chest pain and palpitations.  Gastrointestinal: Negative for abdominal pain.  Musculoskeletal: Negative for  back pain.  Skin: Negative for rash.  Neurological: Positive for headaches. Negative for dizziness, syncope, speech difficulty, weakness and numbness.  Psychiatric/Behavioral: Negative for behavioral problems and dysphoric mood. The patient is not nervous/anxious.     Past Medical History:  Diagnosis Date  . Chest pain    a. non-cardiac 2005, 2012.  Marland Kitchen Hemorrhoids 11-21-2009   colonoscopy  . 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 Hypothyroidism    a. s/p thyroidectomy ~ 1998  . Palpitations      Social History   Socioeconomic History  . Marital status: Divorced    Spouse name: Not on file  . Number of children: 2  . Years of education: 2  . Highest education level: Some college, no degree  Occupational History  . Occupation: Ecologist: [employer]  Tobacco Use  . Smoking status: Never Smoker  . Smokeless tobacco: Never Used  Substance and Sexual Activity  . Alcohol use: No  . Drug use: No  . Sexual activity: Not on file  Other Topics Concern  . Not on file  Social History Narrative   Lives in [place].  Active, walks/jogs.  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  Social Determinants of Health   Financial Resource Strain:   . Difficulty of Paying Living Expenses: Not on file  Food Insecurity:   . Worried About Charity fundraiser in the Last Year: Not on file  . Ran Out of Food in the Last Year: Not on file  Transportation Needs:   . Lack of Transportation (Medical): Not on file  . Lack of Transportation (Non-Medical): Not on file  Physical Activity:   . Days of Exercise per Week: Not on file  . Minutes of Exercise per Session: Not on file  Stress:   . Feeling of Stress : Not on file  Social Connections:   . Frequency of Communication with Friends and Family: Not on file  . Frequency  of Social Gatherings with Friends and Family: Not on file  . Attends Religious Services: Not on file  . Active Member of Clubs or Organizations: Not on file  . Attends Archivist Meetings: Not on file  . Marital Status: Not on file  Intimate Partner Violence:   . Fear of Current or Ex-Partner: Not on file  . Emotionally Abused: Not on file  . Physically Abused: Not on file  . Sexually Abused: Not on file    Past Surgical History:  Procedure Laterality Date  . bone spur  6/11   right big toe  . SHOULDER ARTHROSCOPY WITH SUBACROMIAL DECOMPRESSION AND OPEN ROTATOR C Right 05/23/2013   Procedure: RIGHT SHOULDER ARTHROSCOPY WITH SUBACROMIAL DECOMPRESSION AND MINI OPEN ROTATOR CUFF REPAIR, AND DISTAL CLAVICLE RESECTION;  Surgeon: Augustin Schooling, MD;  Location: [facility];  Service: Orthopedics;  Laterality: Right;  . stress cardiolite  05/23/03  . THYROIDECTOMY  12/29/96  . TOTAL ABDOMINAL HYSTERECTOMY  2008    Family History  Problem Relation Age of Onset  . Hypertension Mother   . Colon polyps Mother   . Coronary artery disease Mother        alive @ 86.  Marland Kitchen Heart disease Mother 31       10/20/13  . Stroke Mother   . Heart attack Mother   . Other Father        alive and well @ 71.  Marland Kitchen Hypertension Father   . Hypertension Sister   . Migraines Brother   . Migraines Sister   . Bone cancer Maternal Aunt   . Thyroid disease Maternal Aunt   . Liver cancer Maternal Aunt   . Hypertension Maternal Grandmother   . Diabetes Maternal Grandmother   . Coronary artery disease Maternal Grandfather   . Heart attack Maternal Grandfather 60  . Coronary artery disease Maternal Uncle   . Hypertension Maternal Uncle   . Heart disease Maternal Aunt   . Glaucoma Paternal Grandmother   . Liver disease Paternal Grandfather     No Known Allergies  Current Outpatient Medications on File Prior to Visit  Medication Sig Dispense Refill  . acetaminophen (TYLENOL) 500 MG tablet Take 500 mg by mouth  every 6 (six) hours as needed for headache (pain).    Marland Kitchen ibuprofen (ADVIL,MOTRIN) 200 MG tablet Take 400 mg by mouth every 6 (six) hours as needed for headache (pain).     Marland Kitchen levothyroxine (SYNTHROID) 100 MCG tablet TAKE 1 TABLET BY MOUTH  DAILY 90 tablet 0  . Melatonin 3 MG TABS Take 3 mg by mouth at bedtime as needed (sleep).      No current facility-administered medications on file prior to visit.    BP 136/80  Pulse 69   Resp 18   Ht 5\' 6"  (1.676 m)   Wt 201 lb 12.8 oz (91.5 kg)   SpO2 100%   BMI 32.57 kg/m       Objective:   Physical Exam  General Mental Status- Alert. General Appearance- Not in acute distress.   Skin General: Color- Normal Color. Moisture- Normal Moisture.  Neck Carotid Arteries- Normal color. Moisture- Normal Moisture. No carotid bruits. No JVD. Bilateral trapezius tender to palpation where trap meets occipital area. No mid cspine pain.   Chest and Lung Exam Auscultation: Breath Sounds:-Normal.  Cardiovascular Auscultation:Rythm- Regular. Murmurs & Other Heart Sounds:Auscultation of the heart reveals- No Murmurs.     Neurologic Cranial Nerve exam:- CN III-XII intact(No nystagmus), symmetric smile. Drift Test:- No drift. Romberg Exam:- Negative.  Heal to Toe Gait exam:-Normal. Finger to Nose:- Normal/Intact Strength:- 5/5 equal and symmetric strength both upper and lower extremities.      Assessment & Plan:  You have history of 1 month approximately old head trauma with persisting headache in the occipital area.  Headache is moderate.  You expressed concern for bleed.  Your neurologic exam is very good today.  You do have some trapezius tenderness to palpation.  We gave you Toradol 60 mg IM in the office today.  Will prescribe Flexeril 5 mg to use 1 tablet at night for the next 4 nights.  Hopefully your occipital and trapezius pain will resolve completely.  Decided not to get cervical spine x-ray as you do not have any mid C-spine region  tenderness.  Since the headache is persisting and you stress concern for bleed type injury from trauma, I did go ahead and place CT of head without contrast order.  This will need to be prior authorized.  Hopefully with above treatment your sinus symptoms will resolve completely.  If you get headache with gross motor or sensory function deficits as described and recommend ED evaluation.  You can take ibuprofen tomorrow afternoon if needed but hold off presently since he just got the Toradol.  Follow-up in 7 days or as needed.  Mackie Pai, PA-C

## 2019-10-10 NOTE — Patient Instructions (Signed)
You have history of 1 month approximately old head trauma with persisting headache in the occipital area.  Headache is moderate.  You expressed concern for bleed.  Your neurologic exam is very good today.  You do have some trapezius tenderness to palpation.  We gave you Toradol 60 mg IM in the office today.  Will prescribe Flexeril 5 mg to use 1 tablet at night for the next 4 nights.  Hopefully your occipital and trapezius pain will resolve completely.  Decided not to get cervical spine x-ray as you do not have any mid C-spine region tenderness.  Since the headache is persisting and you stress concern for bleed type injury from trauma, I did go ahead and place CT of head without contrast order.  This will need to be prior authorized.  Hopefully with above treatment your sinus symptoms will resolve completely.  If you get headache with gross motor or sensory function deficits as described and recommend ED evaluation.  You can take ibuprofen tomorrow afternoon if needed but hold off presently since he just got the Toradol.  Follow-up in 7 days or as needed.

## 2019-10-11 ENCOUNTER — Encounter: Payer: Self-pay | Admitting: Gastroenterology

## 2019-10-12 NOTE — Telephone Encounter (Addendum)
Done. Thanks.

## 2019-10-13 ENCOUNTER — Other Ambulatory Visit: Payer: Self-pay

## 2019-10-13 ENCOUNTER — Ambulatory Visit (HOSPITAL_BASED_OUTPATIENT_CLINIC_OR_DEPARTMENT_OTHER)
Admission: RE | Admit: 2019-10-13 | Discharge: 2019-10-13 | Disposition: A | Discharge status: Home / Self Care | Payer: 59 | Source: Ambulatory Visit | Attending: Medical | Admitting: Medical

## 2019-10-13 DIAGNOSIS — S060X0A Concussion without loss of consciousness, initial encounter: Secondary | ICD-10-CM | POA: Diagnosis not present

## 2019-10-13 DIAGNOSIS — S0990XD Unspecified injury of head, subsequent encounter: Secondary | ICD-10-CM | POA: Diagnosis present

## 2019-10-13 DIAGNOSIS — R519 Headache, unspecified: Secondary | ICD-10-CM

## 2019-10-14 ENCOUNTER — Encounter: Payer: Self-pay | Admitting: Medical

## 2019-10-14 MED FILL — PFIZER-BIONTECH COVID-19 VA: 30 | 1 days supply | Qty: 0 | Fill #0

## 2019-10-17 ENCOUNTER — Other Ambulatory Visit: Payer: Self-pay

## 2019-10-17 ENCOUNTER — Ambulatory Visit (INDEPENDENT_AMBULATORY_CARE_PROVIDER_SITE_OTHER): Payer: 59 | Admitting: Medical

## 2019-10-17 VITALS — BP 127/77 | HR 73 | Resp 16 | Ht 66.0 in | Wt 200.0 lb

## 2019-10-17 DIAGNOSIS — S060X0A Concussion without loss of consciousness, initial encounter: Secondary | ICD-10-CM

## 2019-10-17 DIAGNOSIS — Z0000 Encounter for general adult medical examination without abnormal findings: Secondary | ICD-10-CM

## 2019-10-17 DIAGNOSIS — R519 Headache, unspecified: Secondary | ICD-10-CM | POA: Diagnosis not present

## 2019-10-17 DIAGNOSIS — Z1231 Encounter for screening mammogram for malignant neoplasm of breast: Secondary | ICD-10-CM | POA: Diagnosis not present

## 2019-10-17 LAB — CBC WITH DIFFERENTIAL/PLATELET
Absolute Monocytes: 402 cells/uL (ref 200–950)
Basophils Absolute: 39 cells/uL (ref 0–200)
Basophils Relative: 0.7 %
Eosinophils Absolute: 182 cells/uL (ref 15–500)
Eosinophils Relative: 3.3 %
HCT: 40.7 % (ref 35.0–45.0)
Hemoglobin: 14 g/dL (ref 11.7–15.5)
Lymphs Abs: 2145 cells/uL (ref 850–3900)
MCH: 28.1 pg (ref 27.0–33.0)
MCHC: 34.4 g/dL (ref 32.0–36.0)
MCV: 81.7 fL (ref 80.0–100.0)
MPV: 12 fL (ref 7.5–12.5)
Monocytes Relative: 7.3 %
Neutro Abs: 2734 cells/uL (ref 1500–7800)
Neutrophils Relative %: 49.7 %
Platelets: 208 10*3/uL (ref 140–400)
RBC: 4.98 10*6/uL (ref 3.80–5.10)
RDW: 14.4 % (ref 11.0–15.0)
Total Lymphocyte: 39 %
WBC: 5.5 10*3/uL (ref 3.8–10.8)

## 2019-10-17 LAB — COMPREHENSIVE METABOLIC PANEL
AG Ratio: 1.6 (calc) (ref 1.0–2.5)
ALT: 21 U/L (ref 6–29)
AST: 21 U/L (ref 10–35)
Albumin: 4.2 g/dL (ref 3.6–5.1)
Alkaline phosphatase (APISO): 67 U/L (ref 37–153)
BUN: 11 mg/dL (ref 7–25)
CO2: 26 mmol/L (ref 20–32)
Calcium: 9.3 mg/dL (ref 8.6–10.4)
Chloride: 107 mmol/L (ref 98–110)
Creat: 0.76 mg/dL (ref 0.50–1.05)
Globulin: 2.6 g/dL (calc) (ref 1.9–3.7)
Glucose, Bld: 94 mg/dL (ref 65–99)
Potassium: 4.9 mmol/L (ref 3.5–5.3)
Sodium: 141 mmol/L (ref 135–146)
Total Bilirubin: 0.5 mg/dL (ref 0.2–1.2)
Total Protein: 6.8 g/dL (ref 6.1–8.1)

## 2019-10-17 LAB — LIPID PANEL
Cholesterol: 215 mg/dL — ABNORMAL HIGH (ref <200)
HDL: 32 mg/dL — ABNORMAL LOW (ref >=50)
LDL Cholesterol (Calc): 161 mg/dL (calc) — ABNORMAL HIGH
Non-HDL Cholesterol (Calc): 183 mg/dL (calc) — ABNORMAL HIGH (ref <130)
Total CHOL/HDL Ratio: 6.7 (calc) — ABNORMAL HIGH (ref <5.0)
Triglycerides: 106 mg/dL (ref <150)

## 2019-10-17 NOTE — Patient Instructions (Addendum)
For you wellness exam today I have ordered cbc, cmp and lipid panel.  Flu vaccine will be done in October.  Recommend exercise and healthy diet.  We will let you know lab results as they come in.  Pt referred to GI for colonocopy.  Pt will self refer to gyn for papsmear.  I placed mammogram to get scheduled.   Mackie Pai, PA-C  Follow up date appointment will be determined after lab review.   I think you had combination of tension ha and post concussion symptoms. Sounds like you are now almost completely resolved. If another week passes and lingering symptoms please let me know.   Preventive Care 51-46 Years Old, Female Preventive care refers to visits with your health care provider and lifestyle choices that can promote health and wellness. This includes:  A yearly physical exam. This may also be called an annual well check.  Regular dental visits and eye exams.  Immunizations.  Screening for certain conditions.  Healthy lifestyle choices, such as eating a healthy diet, getting regular exercise, not using drugs or products that contain nicotine and tobacco, and limiting alcohol use. What can I expect for my preventive care visit? Physical exam Your health care provider will check your:  Height and weight. This may be used to calculate body mass index (BMI), which tells if you are at a healthy weight.  Heart rate and blood pressure.  Skin for abnormal spots. Counseling Your health care provider may ask you questions about your:  Alcohol, tobacco, and drug use.  Emotional well-being.  Home and relationship well-being.  Sexual activity.  Eating habits.  Work and work Statistician.  Method of birth control.  Menstrual cycle.  Pregnancy history. What immunizations do I need?  Influenza (flu) vaccine  This is recommended every year. Tetanus, diphtheria, and pertussis (Tdap) vaccine  You may need a Td booster every 10 years. Varicella (chickenpox)  vaccine  You may need this if you have not been vaccinated. Zoster (shingles) vaccine  You may need this after age 51. Measles, mumps, and rubella (MMR) vaccine  You may need at least one dose of MMR if you were born in 1957 or later. You may also need a second dose. Pneumococcal conjugate (PCV13) vaccine  You may need this if you have certain conditions and were not previously vaccinated. Pneumococcal polysaccharide (PPSV23) vaccine  You may need one or two doses if you smoke cigarettes or if you have certain conditions. Meningococcal conjugate (MenACWY) vaccine  You may need this if you have certain conditions. Hepatitis A vaccine  You may need this if you have certain conditions or if you travel or work in places where you may be exposed to hepatitis A. Hepatitis B vaccine  You may need this if you have certain conditions or if you travel or work in places where you may be exposed to hepatitis B. Haemophilus influenzae type b (Hib) vaccine  You may need this if you have certain conditions. Human papillomavirus (HPV) vaccine  If recommended by your health care provider, you may need three doses over 6 months. You may receive vaccines as individual doses or as more than one vaccine together in one shot (combination vaccines). Talk with your health care provider about the risks and benefits of combination vaccines. What tests do I need? Blood tests  Lipid and cholesterol levels. These may be checked every 5 years, or more frequently if you are over 51 years old.  Hepatitis C test.  Hepatitis B  test. Screening  Lung cancer screening. You may have this screening every year starting at age 13 if you have a 30-pack-year history of smoking and currently smoke or have quit within the past 15 years.  Colorectal cancer screening. All adults should have this screening starting at age 51 and continuing until age 58. Your health care provider may recommend screening at age 37 if you  are at increased risk. You will have tests every 1-10 years, depending on your results and the type of screening test.  Diabetes screening. This is done by checking your blood sugar (glucose) after you have not eaten for a while (fasting). You may have this done every 1-3 years.  Mammogram. This may be done every 1-2 years. Talk with your health care provider about when you should start having regular mammograms. This may depend on whether you have a family history of breast cancer.  BRCA-related cancer screening. This may be done if you have a family history of breast, ovarian, tubal, or peritoneal cancers.  Pelvic exam and Pap test. This may be done every 3 years starting at age 51. Starting at age 51, this may be done every 5 years if you have a Pap test in combination with an HPV test. Other tests  Sexually transmitted disease (STD) testing.  Bone density scan. This is done to screen for osteoporosis. You may have this scan if you are at high risk for osteoporosis. Follow these instructions at home: Eating and drinking  Eat a diet that includes fresh fruits and vegetables, whole grains, lean protein, and low-fat dairy.  Take vitamin and mineral supplements as recommended by your health care provider.  Do not drink alcohol if: ? Your health care provider tells you not to drink. ? You are pregnant, may be pregnant, or are planning to become pregnant.  If you drink alcohol: ? Limit how much you have to 0-1 drink a day. ? Be aware of how much alcohol is in your drink. In the [place] one drink equals one 12 oz bottle of beer (355 mL), one 5 oz glass of wine (148 mL), or one 1 oz glass of hard liquor (44 mL). Lifestyle  Take daily care of your teeth and gums.  Stay active. Exercise for at least 30 minutes on 5 or more days each week.  Do not use any products that contain nicotine or tobacco, such as cigarettes, e-cigarettes, and chewing tobacco. If you need help quitting, ask your  health care provider.  If you are sexually active, practice safe sex. Use a condom or other form of birth control (contraception) in order to prevent pregnancy and STIs (sexually transmitted infections).  If told by your health care provider, take low-dose aspirin daily starting at age 16. What's next?  Visit your health care provider once a year for a well check visit.  Ask your health care provider how often you should have your eyes and teeth checked.  Stay up to date on all vaccines. This information is not intended to replace advice given to you by your health care provider. Make sure you discuss any questions you have with your health care provider. Document Revised: 09/17/2017 Document Reviewed: 09/17/2017 Elsevier Patient Education  2020 [employer].

## 2019-10-17 NOTE — Progress Notes (Signed)
Subjective:    Patient ID: Karen Merritt, female    DOB: 08/28/1970, 49 y.o.   MRN: [id number]  HPI  Pt in for follow up. For ha.  She also wants to do wellness exam.  Pt will get flu vaccine early October.  Pt will get pap with her gyn. I ordered mammogram today.  Pt has appointment to see GI MD.   Pt states now she only has faint level 1-2/10 level occipital area ha and pressure almost gone. This occurred after bump of head. Falling back and hitting head on back of picnic.   Apparent possible prolonged post concussion type symptoms. CT was negative.  I had prescribed flexeril to use at night. Also toradol on last visit. Flexeril seemed to help.    Pt states next day felt loopy after taking flexeril.  Review of Systems  Constitutional: Negative for chills, fatigue and fever.  HENT: Negative for dental problem.   Respiratory: Negative for chest tightness, shortness of breath and wheezing.   Cardiovascular: Negative for chest pain and palpitations.  Gastrointestinal: Negative for abdominal pain.  Musculoskeletal: Negative for back pain.  Skin: Negative for rash.  Neurological: Negative for dizziness, numbness and headaches.  Hematological: Does not bruise/bleed easily.  Psychiatric/Behavioral: Negative for behavioral problems and confusion.   Past Medical History:  Diagnosis Date  . Chest pain    a. non-cardiac 2005, 2012.  Marland Kitchen Hemorrhoids 11-21-2009   colonoscopy  . 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 Hypothyroidism    a. s/p thyroidectomy ~ 1998  . Palpitations      Social History   Socioeconomic History  . Marital status: Divorced    Spouse name: Not on file  . Number of children: 2  . Years of education: 59  . Highest education level: Some college, no degree  Occupational History  . Occupation: Ecologist: [employer]  Tobacco Use  . Smoking status: Never Smoker  . Smokeless tobacco: Never Used  Substance and Sexual  Activity  . Alcohol use: No  . Drug use: No  . Sexual activity: Not on file  Other Topics Concern  . Not on file  Social History Narrative   Lives in [place].  Active, walks/jogs.  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   Social Determinants of Health   Financial Resource Strain:   . Difficulty of Paying Living Expenses: Not on file  Food Insecurity:   . Worried About Charity fundraiser in the Last Year: Not on file  . Ran Out of Food in the Last Year: Not on file  Transportation Needs:   . Lack of Transportation (Medical): Not on file  . Lack of Transportation (Non-Medical): Not on file  Physical Activity:   . Days of Exercise per Week: Not on file  . Minutes of Exercise per Session: Not on file  Stress:   . Feeling of Stress : Not on file  Social Connections:   . Frequency of Communication with Friends and Family: Not on file  . Frequency of Social Gatherings with Friends and Family: Not on file  . Attends Religious Services: Not on file  . Active Member of Clubs or Organizations: Not on file  . Attends Archivist Meetings: Not on file  . Marital Status: Not on file  Intimate Partner Violence:   . Fear of Current or Ex-Partner: Not on file  . Emotionally Abused: Not on file  . Physically Abused: Not on file  . Sexually Abused: Not on file    Past Surgical History:  Procedure Laterality Date  . bone spur  6/11   right big toe  . SHOULDER ARTHROSCOPY WITH SUBACROMIAL DECOMPRESSION AND OPEN ROTATOR C Right 05/23/2013   Procedure: RIGHT SHOULDER ARTHROSCOPY WITH SUBACROMIAL DECOMPRESSION AND MINI OPEN ROTATOR CUFF REPAIR, AND DISTAL CLAVICLE RESECTION;  Surgeon: Augustin Schooling, MD;  Location: [facility];  Service: Orthopedics;  Laterality: Right;  . stress cardiolite  05/23/03  . THYROIDECTOMY  12/29/96  . TOTAL ABDOMINAL  HYSTERECTOMY  2008    Family History  Problem Relation Age of Onset  . Hypertension Mother   . Colon polyps Mother   . Coronary artery disease Mother        alive @ 96.  Marland Kitchen Heart disease Mother 80       10/20/13  . Stroke Mother   . Heart attack Mother   . Other Father        alive and well @ 82.  Marland Kitchen Hypertension Father   . Hypertension Sister   . Migraines Brother   . Migraines Sister   . Bone cancer Maternal Aunt   . Thyroid disease Maternal Aunt   . Liver cancer Maternal Aunt   . Hypertension Maternal Grandmother   . Diabetes Maternal Grandmother   . Coronary artery disease Maternal Grandfather   . Heart attack Maternal Grandfather 60  . Coronary artery disease Maternal Uncle   . Hypertension Maternal Uncle   . Heart disease Maternal Aunt   . Glaucoma Paternal Grandmother   . Liver disease Paternal Grandfather     No Known Allergies  Current Outpatient Medications on File Prior to Visit  Medication Sig Dispense Refill  . acetaminophen (TYLENOL) 500 MG tablet Take 500 mg by mouth every 6 (six) hours as needed for headache (pain).    . cyclobenzaprine (FLEXERIL) 5 MG tablet Take 1 tablet (5 mg total) by mouth at bedtime. 4 tablet 0  . dibucaine (NUPERCAINAL) 1 % ointment Apply topically 3 (three) times daily as needed for pain. 30 g 0  . ibuprofen (ADVIL,MOTRIN) 200 MG tablet Take 400 mg by mouth every 6 (six) hours as needed for headache (pain).     Marland Kitchen levothyroxine (SYNTHROID) 100 MCG tablet TAKE 1 TABLET BY MOUTH  DAILY 90 tablet 0  . Melatonin 3 MG TABS Take 3 mg by mouth at bedtime as needed (sleep).      No current facility-administered medications on file prior to visit.    BP 127/77 (BP Location: Left Arm, Patient Position: Sitting, Cuff Size: Normal)   Pulse 73   Resp 16   Ht 5\' 6"  (1.676 m)   Wt 200 lb (90.7 kg)   SpO2 99%   BMI 32.28 kg/m       Objective:   Physical Exam  General Mental Status- Alert. General Appearance- Not in acute distress.    Skin General: Color- Normal Color. Moisture- Normal Moisture.  Neck Carotid Arteries- Normal color. Moisture- Normal Moisture. No carotid bruits. No JVD.  Chest and Lung Exam Auscultation: Breath Sounds:-Normal.  Cardiovascular Auscultation:Rythm- Regular. Murmurs & Other Heart Sounds:Auscultation of the heart reveals- No Murmurs.  Abdomen Inspection:-Inspeection Normal. Palpation/Percussion:Note:No mass. Palpation and Percussion of the abdomen reveal- Non Tender, Non Distended + BS, no rebound or guarding.   Neurologic Cranial  Nerve exam:- CN III-XII intact(No nystagmus), symmetric smile. Strength:- 5/5 equal and symmetric strength both upper and lower extremities.      Assessment & Plan:  or you wellness exam today I have ordered cbc, cmp and lipid panel.  Flu vaccine will be done in October.  Recommend exercise and healthy diet.  We will let you know lab results as they come in.  Pt referred to GI for colonocopy.  Pt will self refer to gyn for papsmear.  I placed mammogram to get scheduled.   Follow up date appointment will be determined after lab review.   I think you had combination of tension ha and post concussion symptoms. Sounds like you are now almost completely resolved. If another week passes and lingering symptoms please let me know.  [id number] as did address and follow up regarding tension HA and post concussion type symptoms.

## 2019-10-20 ENCOUNTER — Other Ambulatory Visit: Payer: Self-pay

## 2019-10-20 ENCOUNTER — Ambulatory Visit (HOSPITAL_BASED_OUTPATIENT_CLINIC_OR_DEPARTMENT_OTHER)
Admission: RE | Admit: 2019-10-20 | Discharge: 2019-10-20 | Disposition: A | Discharge status: Home / Self Care | Payer: 59 | Source: Ambulatory Visit | Attending: Medical | Admitting: Medical

## 2019-10-20 DIAGNOSIS — Z1231 Encounter for screening mammogram for malignant neoplasm of breast: Secondary | ICD-10-CM

## 2019-10-29 ENCOUNTER — Other Ambulatory Visit: Payer: Self-pay | Admitting: Endocrinology

## 2019-11-14 ENCOUNTER — Encounter (HOSPITAL_BASED_OUTPATIENT_CLINIC_OR_DEPARTMENT_OTHER): Payer: Self-pay

## 2019-11-14 ENCOUNTER — Ambulatory Visit (HOSPITAL_BASED_OUTPATIENT_CLINIC_OR_DEPARTMENT_OTHER)
Admission: RE | Admit: 2019-11-14 | Discharge: 2019-11-14 | Disposition: A | Discharge status: Home / Self Care | Payer: 59 | Source: Ambulatory Visit | Attending: Medical | Admitting: Medical

## 2019-11-14 ENCOUNTER — Other Ambulatory Visit: Payer: Self-pay

## 2019-11-14 DIAGNOSIS — Z1231 Encounter for screening mammogram for malignant neoplasm of breast: Secondary | ICD-10-CM | POA: Insufficient documentation

## 2019-11-17 ENCOUNTER — Encounter: Payer: Self-pay | Admitting: Gastroenterology

## 2019-11-17 ENCOUNTER — Ambulatory Visit (INDEPENDENT_AMBULATORY_CARE_PROVIDER_SITE_OTHER): Payer: 59 | Admitting: Gastroenterology

## 2019-11-17 VITALS — BP 116/72 | HR 79 | Ht 66.0 in | Wt 201.2 lb

## 2019-11-17 DIAGNOSIS — Z1212 Encounter for screening for malignant neoplasm of rectum: Secondary | ICD-10-CM | POA: Diagnosis not present

## 2019-11-17 DIAGNOSIS — K594 Anal spasm: Secondary | ICD-10-CM

## 2019-11-17 DIAGNOSIS — K2100 Gastro-esophageal reflux disease with esophagitis, without bleeding: Secondary | ICD-10-CM | POA: Diagnosis not present

## 2019-11-17 DIAGNOSIS — Z1211 Encounter for screening for malignant neoplasm of colon: Secondary | ICD-10-CM | POA: Diagnosis not present

## 2019-11-17 MED ORDER — AMBULATORY NON FORMULARY MEDICATION: 0 refills | Status: DC

## 2019-11-17 MED ORDER — CLENPIQ 10-3.5-12 MG-GM -GM/160ML PO SOLN: 1.0000 | ORAL | 0 refills | Status: DC

## 2019-11-17 NOTE — Progress Notes (Signed)
Chief Complaint: Rectal pain  Referring Provider:     Mackie Pai, PA-C    HPI:     Karen Merritt is a 49 y.o. female referred to the [facility] for evaluation of rectal pain.  Has a longstanding history of intermittent, intense rectal pain, it started after hysterectomy 2007.  Has been previously seen by Dr. Sharlett Iles for this issue, and was also referred to Dr. Virgia Land at [facility] in 02/2013.  History of prior anal fissure but none present at that time.  Diagnosed with proctalgia and treated with nifedipine topical as needed, Flexeril prn, with consideration for Valium 5 mg suppository as needed.  Otherwise, did not recommend rectal EUS or Botox.  Today, she states sxs had been every 3-4 months, described as acute onset, severe, debilitating pain, lasting 5-15 mins.  Symptoms now occurring once/month.  She is asymptomatic in between episodes.  Symptoms can wake from sleep. 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  Has had syncope from the pain.  Did go to pelvic floor PT a few years ago.   Will use Dibucainne topical prn, Flexeril prn.  Has trialed Sitz baths, fiber supplement.   Separately, history of GERD with erosive esophagitis, treated with Nexium 40 mg/day.  Last seen by Dr. Sharlett Iles in 06/2012 for this issue. Reflux well controlled with dietary mods alone now; no longer taking acid suppression therapy.   Also with a history of benign hepatic hemangioma.  Last imaging 3 mm hemangioma on ultrasound in 03/2011.  Prior to that confirmed hemangioma on MRI 05/2008.  Endoscopic History: -Colonoscopy (2011, Dr. Sharlett Iles): External hemorrhoids, hypertrophied anal papilla -EGD (5/14, Dr. Sharlett Iles): LA Grade A esophagitis, small hiatal hernia, otherwise normal stomach/duodenum   CBC    Component Value Date/Time   WBC 5.5 10/17/2019 0831   RBC 4.98 10/17/2019 0831   HGB 14.0  10/17/2019 0831   HCT 40.7 10/17/2019 0831   PLT 208 10/17/2019 0831   MCV 81.7 10/17/2019 0831   MCH 28.1 10/17/2019 0831   MCHC 34.4 10/17/2019 0831   RDW 14.4 10/17/2019 0831   LYMPHSABS 2,145 10/17/2019 0831   MONOABS 0.8 08/31/2017 1837   EOSABS 182 10/17/2019 0831   BASOSABS 39 10/17/2019 0831    CMP     Component Value Date/Time   NA 141 10/17/2019 0831   K 4.9 10/17/2019 0831   CL 107 10/17/2019 0831   CO2 26 10/17/2019 0831   GLUCOSE 94 10/17/2019 0831   BUN 11 10/17/2019 0831   CREATININE 0.76 10/17/2019 0831   CALCIUM 9.3 10/17/2019 0831   PROT 6.8 10/17/2019 0831   ALBUMIN 4.0 08/24/2017 0954   AST 21 10/17/2019 0831   ALT 21 10/17/2019 0831   ALKPHOS 65 08/24/2017 0954   BILITOT 0.5 10/17/2019 0831   GFRNONAA >60 08/31/2017 1837   GFRAA >60 08/31/2017 1837      Past Medical History:  Diagnosis Date  . Chest pain    a. non-cardiac 2005, 2012.  Marland Kitchen Hemorrhoids 11-21-2009   colonoscopy  . 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 Hypothyroidism    a. s/p thyroidectomy ~ 1998  . Palpitations      Past Surgical History:  Procedure Laterality Date  . bone spur  6/11   right big toe  . SHOULDER ARTHROSCOPY WITH SUBACROMIAL DECOMPRESSION AND OPEN ROTATOR C Right 05/23/2013  Procedure: RIGHT SHOULDER ARTHROSCOPY WITH SUBACROMIAL DECOMPRESSION AND MINI OPEN ROTATOR CUFF REPAIR, AND DISTAL CLAVICLE RESECTION;  Surgeon: Augustin Schooling, MD;  Location: [facility];  Service: Orthopedics;  Laterality: Right;  . stress cardiolite  05/23/03  . THYROIDECTOMY  12/29/96  . TOTAL ABDOMINAL HYSTERECTOMY  2008   Family History  Problem Relation Age of Onset  . Hypertension Mother   . Colon polyps Mother   . Coronary artery disease Mother        alive @ 88.  Marland Kitchen Heart disease Mother 58       10/20/13  . Stroke Mother   . Heart attack Mother   . Other Father        alive and well @ 59.  Marland Kitchen Hypertension Father   . Hypertension Sister   . Migraines Brother     . Migraines Sister   . Bone cancer Maternal Aunt   . Thyroid disease Maternal Aunt   . Thyroid cancer Maternal Aunt   . Liver cancer Maternal Aunt   . Hypertension Maternal Grandmother   . Diabetes Maternal Grandmother   . Coronary artery disease Maternal Grandfather   . Heart attack Maternal Grandfather 60  . Coronary artery disease Maternal Uncle   . Hypertension Maternal Uncle   . Heart disease Maternal Aunt   . Glaucoma Paternal Grandmother   . Liver disease Paternal Grandfather   . Colon cancer Neg Hx    Social History   Tobacco Use  . Smoking status: Never Smoker  . Smokeless tobacco: Never Used  Vaping Use  . Vaping Use: Never used  Substance Use Topics  . Alcohol use: No  . Drug use: No   Current Outpatient Medications  Medication Sig Dispense Refill  . acetaminophen (TYLENOL) 500 MG tablet Take 500 mg by mouth every 6 (six) hours as needed for headache (pain).    . cyclobenzaprine (FLEXERIL) 5 MG tablet Take 1 tablet (5 mg total) by mouth at bedtime. (Patient taking differently: Take 5 mg by mouth as needed. ) 4 tablet 0  . ibuprofen (ADVIL,MOTRIN) 200 MG tablet Take 400 mg by mouth every 6 (six) hours as needed for headache (pain).     Marland Kitchen levothyroxine (SYNTHROID) 100 MCG tablet TAKE 1 TABLET BY MOUTH  DAILY 90 tablet 3  . Melatonin 3 MG TABS Take 3 mg by mouth at bedtime as needed (sleep).     . dibucaine (NUPERCAINAL) 1 % OINT as needed.     No current facility-administered medications for this visit.   No Known Allergies   Review of Systems: All systems reviewed and negative except where noted in HPI.     Physical Exam:    Wt Readings from Last 3 Encounters:  11/17/19 201 lb 4 oz (91.3 kg)  10/17/19 200 lb (90.7 kg)  10/10/19 201 lb 12.8 oz (91.5 kg)    BP 116/72   Pulse 79   Ht 5\' 6"  (1.676 m)   Wt 201 lb 4 oz (91.3 kg)   BMI 32.48 kg/m  Constitutional:  Pleasant, in no acute distress. Psychiatric: Normal mood and affect. Behavior is  normal. EENT: Pupils normal.  Conjunctivae are normal. No scleral icterus. Neck supple. No cervical LAD. Cardiovascular: Normal rate, regular rhythm. No edema Pulmonary/chest: Effort normal and breath sounds normal. No wheezing, rales or rhonchi. Abdominal: Soft, nondistended, nontender. Bowel sounds active throughout. There are no masses palpable. No hepatomegaly. Neurological: Alert and oriented to person place and time. Skin: Skin is warm and  dry. No rashes noted. Rectal exam: Sensation intact and preserved anal wink.  External skin tags.  No external anal fissures, external hemorrhoids. Normal sphincter tone. No palpable mass. No blood on the exam glove. No pain elicited with palpation of coccyx or manipulation of levator ani. (Chaperone: Lanny Hurst, CMA).    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  Discussed complete DDx, to include levator ani syndrome, coccydynia, but history and exam not consistent with either Crohn's.  No prior history of IBD.  Plan for the following: -No response to previous trial of topical diltiazem -Okay to resume topical dibucaine as needed -Trial topical NTG 0.2% to be used as needed for recurrence of pain -Could consider referral back to PT for pelvic floor biofeedback - Discussed referral for consideration of Botox, but would like to hold off for now - Diary of sxs -Can retrial sitz bath's pending response to the above  2) Colon Cancer screening: - Due to age appropriate, ongoing CRC screening - Schedule colonoscopy  3) GERD with previous erosive esophagitis -Reflux symptoms well controlled with dietary modifications alone now  The indications, risks, and benefits of colonoscopy were explained to the patient in detail. Risks include but are not limited to bleeding, perforation, adverse reaction to medications, and cardiopulmonary compromise. Sequelae include but are not limited to the  possibility of surgery, hospitalization, and mortality. The patient verbalized understanding and wished to proceed. All questions answered, referred to the scheduler and bowel prep ordered. Further recommendations pending results of the exam.    Lavena Bullion, DO, FACG  11/17/2019, 2:08 PM   Saguier, Percell Miller, PA-C

## 2019-11-17 NOTE — Patient Instructions (Signed)
If you are age 50 or older, your body mass index should be between 23-30. Your Body mass index is 32.48 kg/m. If this is out of the aforementioned range listed, please consider follow up with your Primary Care Provider.  If you are age 66 or younger, your body mass index should be between 19-25. Your Body mass index is 32.48 kg/m. If this is out of the aformentioned range listed, please consider follow up with your Primary Care Provider.     [facility]'s information is below: Address: [address], [place], [place] [address]  [phone]  *Please DO NOT go directly from our office to pick up this medication! Give the pharmacy 1 day to process the prescription as this is compounded and takes time to make.  Due to recent changes in healthcare laws, you may see the results of your imaging and laboratory studies on MyChart before your provider has had a chance to review them.  We understand that in some cases there may be results that are confusing or concerning to you. Not all laboratory results come back in the same time frame and the provider may be waiting for multiple results in order to interpret others.  Please give [place] 48 hours in order for your provider to thoroughly review all the results before contacting the office for clarification of your results.

## 2019-11-18 ENCOUNTER — Telehealth: Payer: Self-pay | Admitting: Gastroenterology

## 2019-11-18 NOTE — Telephone Encounter (Signed)
LMOM for patient to call back.

## 2019-11-21 ENCOUNTER — Other Ambulatory Visit: Payer: Self-pay

## 2019-11-21 ENCOUNTER — Ambulatory Visit (INDEPENDENT_AMBULATORY_CARE_PROVIDER_SITE_OTHER): Payer: 59 | Admitting: Endocrinology

## 2019-11-21 ENCOUNTER — Encounter: Payer: Self-pay | Admitting: Endocrinology

## 2019-11-21 VITALS — BP 120/80 | HR 65 | Ht 66.0 in | Wt 198.0 lb

## 2019-11-21 DIAGNOSIS — E890 Postprocedural hypothyroidism: Secondary | ICD-10-CM | POA: Diagnosis not present

## 2019-11-21 LAB — T4, FREE: Free T4: 0.84 ng/dL (ref 0.60–1.60)

## 2019-11-21 LAB — TSH: TSH: 2.77 u[IU]/mL (ref 0.35–4.50)

## 2019-11-21 NOTE — Patient Instructions (Signed)
Blood tests are requested for you today.  We'll let you know about the results.  It is best to never miss the medication.  However, if you do miss it, next best is to double up the next time.  Please come back for a follow-up appointment in 1 year.   

## 2019-11-21 NOTE — Progress Notes (Signed)
Subjective:    Patient ID: Karen Merritt, female    DOB: Feb 25, 1970, 49 y.o.   MRN: [id number]  HPI Pt returns for f/u of postsurgical hypothyroidism (pt had thyroidectomy in 1998 due to the mass effect of a multinodular goiter; CT (2019) showed status post thyroidectomy). pt states she feels well in general.  She says she never misses the synthroid.   Past Medical History:  Diagnosis Date  . Chest pain    a. non-cardiac 2005, 2012.  Marland Kitchen Hemorrhoids 11-21-2009   colonoscopy  . 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 Hypothyroidism    a. s/p thyroidectomy ~ 1998  . Palpitations     Past Surgical History:  Procedure Laterality Date  . bone spur  6/11   right big toe  . SHOULDER ARTHROSCOPY WITH SUBACROMIAL DECOMPRESSION AND OPEN ROTATOR C Right 05/23/2013   Procedure: RIGHT SHOULDER ARTHROSCOPY WITH SUBACROMIAL DECOMPRESSION AND MINI OPEN ROTATOR CUFF REPAIR, AND DISTAL CLAVICLE RESECTION;  Surgeon: Augustin Schooling, MD;  Location: [facility];  Service: Orthopedics;  Laterality: Right;  . stress cardiolite  05/23/03  . THYROIDECTOMY  12/29/96  . TOTAL ABDOMINAL HYSTERECTOMY  2008    Social History   Socioeconomic History  . Marital status: Divorced    Spouse name: Not on file  . Number of children: 2  . Years of education: 32  . Highest education level: Some college, no degree  Occupational History  . Occupation: Ecologist: [employer]  Tobacco Use  . Smoking status: Never Smoker  . Smokeless tobacco: Never Used  Vaping Use  . Vaping Use: Never used  Substance and Sexual Activity  . Alcohol use: No  . Drug use: No  . Sexual activity: Not on file  Other Topics Concern  . Not on file  Social History Narrative   Lives in [place].  Active, walks/jogs.  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   Social  Determinants of Health   Financial Resource Strain:   . Difficulty of Paying Living Expenses: Not on file  Food Insecurity:   . Worried About Charity fundraiser in the Last Year: Not on file  . Ran Out of Food in the Last Year: Not on file  Transportation Needs:   . Lack of Transportation (Medical): Not on file  . Lack of Transportation (Non-Medical): Not on file  Physical Activity:   . Days of Exercise per Week: Not on file  . Minutes of Exercise per Session: Not on file  Stress:   . Feeling of Stress : Not on file  Social Connections:   . Frequency of Communication with Friends and Family: Not on file  . Frequency of Social Gatherings with Friends and Family: Not on file  . Attends Religious Services: Not on file  . Active Member of Clubs or Organizations: Not on file  . Attends Archivist Meetings: Not on file  . Marital Status: Not on file  Intimate Partner Violence:   . Fear of Current or Ex-Partner: Not on file  . Emotionally Abused: Not on file  . Physically Abused: Not on file  . Sexually Abused: Not on file    Current Outpatient Medications on File Prior to Visit  Medication Sig Dispense Refill  . acetaminophen (TYLENOL) 500 MG tablet Take 500  mg by mouth every 6 (six) hours as needed for headache (pain).    . AMBULATORY NON FORMULARY MEDICATION Nitroglycerine ointment 0.125 %  Apply a pea sized amount internally four times daily. Dispense 30 GM zero refill 30 g 0  . cyclobenzaprine (FLEXERIL) 5 MG tablet Take 1 tablet (5 mg total) by mouth at bedtime. (Patient taking differently: Take 5 mg by mouth as needed. ) 4 tablet 0  . dibucaine (NUPERCAINAL) 1 % OINT as needed.    Marland Kitchen ibuprofen (ADVIL,MOTRIN) 200 MG tablet Take 400 mg by mouth every 6 (six) hours as needed for headache (pain).     Marland Kitchen levothyroxine (SYNTHROID) 100 MCG tablet TAKE 1 TABLET BY MOUTH  DAILY 90 tablet 3  . Melatonin 3 MG TABS Take 3 mg by mouth at bedtime as needed (sleep).     . Sod  Picosulfate-Mag Ox-Cit Acd (CLENPIQ) 10-3.5-12 MG-GM -GM/160ML SOLN Take 1 kit by mouth as directed. 320 mL 0   No current facility-administered medications on file prior to visit.    No Known Allergies  Family History  Problem Relation Age of Onset  . Hypertension Mother   . Colon polyps Mother   . Coronary artery disease Mother        alive @ 66.  Marland Kitchen Heart disease Mother 25       10/20/13  . Stroke Mother   . Heart attack Mother   . Other Father        alive and well @ 45.  Marland Kitchen Hypertension Father   . Hypertension Sister   . Migraines Brother   . Migraines Sister   . Bone cancer Maternal Aunt   . Thyroid disease Maternal Aunt   . Thyroid cancer Maternal Aunt   . Liver cancer Maternal Aunt   . Hypertension Maternal Grandmother   . Diabetes Maternal Grandmother   . Coronary artery disease Maternal Grandfather   . Heart attack Maternal Grandfather 60  . Coronary artery disease Maternal Uncle   . Hypertension Maternal Uncle   . Heart disease Maternal Aunt   . Glaucoma Paternal Grandmother   . Liver disease Paternal Grandfather   . Colon cancer Neg Hx     BP 120/80   Pulse 65   Ht _0  (1.676 m)   Wt 198 lb (89.8 kg)   SpO2 98%   BMI 31.96 kg/m    Review of Systems     Objective:   Physical Exam VITAL SIGNS:  See vs page GENERAL: no distress.   Neck: a healed scar is present.  I do not appreciate a nodule in the thyroid or elsewhere in the neck.    Lab Results  Component Value Date   TSH 2.77 11/21/2019       Assessment & Plan:  Hypothyroidism: well-replaced.  Please continue the same synthroid.

## 2019-11-21 NOTE — Telephone Encounter (Signed)
Left voice mail for patient to call.

## 2019-11-22 ENCOUNTER — Other Ambulatory Visit: Payer: Self-pay | Admitting: General Surgery

## 2019-11-22 MED ORDER — CLENPIQ 10-3.5-12 MG-GM -GM/160ML PO SOLN: 1.0000 | ORAL | 0 refills | Status: DC

## 2019-11-22 NOTE — Telephone Encounter (Signed)
The NTG is tobe used as needed. Try to apply at onset of symptoms or if feeling symptoms coming on. Thanks for coordinating the prep!

## 2019-11-22 NOTE — Telephone Encounter (Signed)
Spoke with Caren Griffins regarding the [employer] prescription. She states even with her coupon [facility] wanted 75.00 for her Copay. She asked if I would send the rx to [facility] so she can use the 50.00 coupon. I agreed I would cancel the rx to [facility] and send it to [facility].

## 2019-11-22 NOTE — Telephone Encounter (Signed)
Patient was curious if she should use the nitroglycerin daily or as needed for pain. I explained to her how the script was written but she wanted me to check with Dr Bryan Lemma because the medication was not covered by insurance and it is costly.

## 2019-11-22 NOTE — Telephone Encounter (Signed)
Pt calling you back, has questions about medication

## 2019-11-24 ENCOUNTER — Telehealth: Payer: Self-pay | Admitting: Gastroenterology

## 2019-11-29 ENCOUNTER — Encounter: Payer: 59 | Admitting: Gastroenterology

## 2019-12-01 ENCOUNTER — Encounter: Payer: Self-pay | Admitting: Medical

## 2019-12-01 NOTE — Telephone Encounter (Signed)
Patient calling to follow up on changing the prep  Medication she had to reschedule her procedure because of this please advise

## 2019-12-01 NOTE — Telephone Encounter (Signed)
Patient called and stated she changed her date for her colonoscopy. Left a Clenpiq sample at front desk with a new set of instructions.12/21/2019

## 2019-12-21 ENCOUNTER — Encounter: Payer: 59 | Admitting: Gastroenterology

## 2019-12-21 ENCOUNTER — Ambulatory Visit (AMBULATORY_SURGERY_CENTER): Payer: 59 | Admitting: Gastroenterology

## 2019-12-21 ENCOUNTER — Encounter: Payer: Self-pay | Admitting: Gastroenterology

## 2019-12-21 ENCOUNTER — Other Ambulatory Visit: Payer: Self-pay

## 2019-12-21 VITALS — BP 132/79 | HR 69 | Temp 98.0°F | Resp 15 | Ht 66.0 in | Wt 201.0 lb

## 2019-12-21 DIAGNOSIS — Z1211 Encounter for screening for malignant neoplasm of colon: Secondary | ICD-10-CM

## 2019-12-21 DIAGNOSIS — D123 Benign neoplasm of transverse colon: Secondary | ICD-10-CM

## 2019-12-21 DIAGNOSIS — D125 Benign neoplasm of sigmoid colon: Secondary | ICD-10-CM

## 2019-12-21 DIAGNOSIS — K641 Second degree hemorrhoids: Secondary | ICD-10-CM

## 2019-12-21 DIAGNOSIS — D128 Benign neoplasm of rectum: Secondary | ICD-10-CM | POA: Diagnosis not present

## 2019-12-21 MED ORDER — SODIUM CHLORIDE 0.9 % IV SOLN: 500.0000 mL | INTRAVENOUS | Status: DC

## 2019-12-21 NOTE — Progress Notes (Signed)
Called to room to assist during endoscopic procedure.  Patient ID and intended procedure confirmed with present staff. Received instructions for my participation in the procedure from the performing physician.  

## 2019-12-21 NOTE — Op Note (Signed)
[facility] Patient Name: Karen Merritt Procedure Date: 12/21/2019 2:49 PM MRN: [id number] Endoscopist: Gerrit Heck , MD Age: 49 Referring MD:  Date of Birth: 1970-03-18 Gender: Female Account #: [id number] Procedure:                Colonoscopy Indications:              Screening for colorectal malignant neoplasm, This                            is the patient's first colonoscopy Medicines:                Monitored Anesthesia Care Procedure:                Pre-Anesthesia Assessment:                           - Prior to the procedure, a History and Physical                            was performed, and patient medications and                            allergies were reviewed. The patient's tolerance of                            previous anesthesia was also reviewed. The risks                            and benefits of the procedure and the sedation                            options and risks were discussed with the patient.                            All questions were answered, and informed consent                            was obtained. Prior Anticoagulants: The patient has                            taken no previous anticoagulant or antiplatelet                            agents. ASA Grade Assessment: II - A patient with                            mild systemic disease. After reviewing the risks                            and benefits, the patient was deemed in                            satisfactory condition to undergo the procedure.  After obtaining informed consent, the colonoscope                            was passed under direct vision. Throughout the                            procedure, the patient's blood pressure, pulse, and                            oxygen saturations were monitored continuously. The                            Colonoscope was introduced through the anus and                            advanced to the the  terminal ileum. The colonoscopy                            was performed without difficulty. The patient                            tolerated the procedure well. The quality of the                            bowel preparation was good. The terminal ileum,                            ileocecal valve, appendiceal orifice, and rectum                            were photographed. Scope In: 3:00:17 PM Scope Out: 3:29:51 PM Scope Withdrawal Time: 0 hours 25 minutes 31 seconds  Total Procedure Duration: 0 hours 29 minutes 34 seconds  Findings:                 Hemorrhoids were found on perianal exam.                           Seven sessile polyps were found in the transverse                            colon (6) and hepatic flexure (1). The polyps were                            3 to 5 mm in size. These polyps were removed with a                            cold snare. Resection and retrieval were complete.                            Estimated blood loss was minimal.                           Five sessile polyps were found in the  proximal                            sigmoid colon. The polyps were 1 to 2 mm in size.                            These polyps were removed with a cold biopsy                            forceps. Resection and retrieval were complete.                            Estimated blood loss was minimal.                           Two sessile polyps were found in the distal sigmoid                            colon. The polyps were 3 to 4 mm in size. These                            polyps were removed with a cold snare. Resection                            and retrieval were complete. Estimated blood loss                            was minimal.                           A 4 mm polyp was found in the rectum. The polyp was                            sessile. The polyp was removed with a cold snare.                            Resection and retrieval were complete. Estimated                             blood loss was minimal.                           Non-bleeding internal hemorrhoids were found during                            retroflexion. The hemorrhoids were medium-sized and                            Grade II (internal hemorrhoids that prolapse but                            reduce spontaneously).  The terminal ileum appeared normal. Complications:            No immediate complications. Estimated Blood Loss:     Estimated blood loss was minimal. Impression:               - Hemorrhoids found on perianal exam.                           - Seven 3 to 5 mm polyps in the transverse colon                            and at the hepatic flexure, removed with a cold                            snare. Resected and retrieved.                           - Five 1 to 2 mm polyps in the proximal sigmoid                            colon, removed with a cold biopsy forceps. Resected                            and retrieved.                           - Two 3 to 4 mm polyps in the distal sigmoid colon,                            removed with a cold snare. Resected and retrieved.                           - One 4 mm polyp in the rectum, removed with a cold                            snare. Resected and retrieved.                           - Non-bleeding internal hemorrhoids.                           - The examined portion of the ileum was normal. Recommendation:           - Patient has a contact number available for                            emergencies. The signs and symptoms of potential                            delayed complications were discussed with the                            patient. Return to normal activities tomorrow.  Written discharge instructions were provided to the                            patient.                           - Resume previous diet.                           - Continue present medications.                            - Await pathology results.                           - Repeat colonoscopy for surveillance based on                            pathology results.                           - Return to GI office PRN.                           - Use fiber, for example Citrucel, Fibercon, Konsyl                            or Metamucil.                           - Internal hemorrhoids were noted on this study and                            may be amenable to hemorrhoid band ligation. If you                            are interested in further treatment of these                            hemorrhoids with band ligation, please contact my                            clinic to set up an appointment for evaluation and                            treatment. Gerrit Heck, MD 12/21/2019 3:36:59 PM

## 2019-12-21 NOTE — Patient Instructions (Signed)
Handouts on polyps and hemorrhoids given to you today  Await pathology results on polyps removed   Use fiber such as Metamucil or Fibercon ,Konsyl or Citrucel  Handout on banding procedure for hemorrhoids if you are interested make an appointment for this procedure     YOU HAD AN ENDOSCOPIC PROCEDURE TODAY AT [facility]:   Refer to the procedure report that was given to you for any specific questions about what was found during the examination.  If the procedure report does not answer your questions, please call your gastroenterologist to clarify.  If you requested that your care partner not be given the details of your procedure findings, then the procedure report has been included in a sealed envelope for you to review at your convenience later.  YOU SHOULD EXPECT: Some feelings of bloating in the abdomen. Passage of more gas than usual.  Walking can help get rid of the air that was put into your GI tract during the procedure and reduce the bloating. If you had a lower endoscopy (such as a colonoscopy or flexible sigmoidoscopy) you may notice spotting of blood in your stool or on the toilet paper. If you underwent a bowel prep for your procedure, you may not have a normal bowel movement for a few days.  Please Note:  You might notice some irritation and congestion in your nose or some drainage.  This is from the oxygen used during your procedure.  There is no need for concern and it should clear up in a day or so.  SYMPTOMS TO REPORT IMMEDIATELY:   Following lower endoscopy (colonoscopy or flexible sigmoidoscopy):  Excessive amounts of blood in the stool  Significant tenderness or worsening of abdominal pains  Swelling of the abdomen that is new, acute  Fever of 100F or higher    For urgent or emergent issues, a gastroenterologist can be reached at any hour by calling [phone]. Do not use MyChart messaging for urgent concerns.    DIET:  We do recommend a  small meal at first, but then you may proceed to your regular diet.  Drink plenty of fluids but you should avoid alcoholic beverages for 24 hours.  ACTIVITY:  You should plan to take it easy for the rest of today and you should NOT DRIVE or use heavy machinery until tomorrow (because of the sedation medicines used during the test).    FOLLOW UP: Our staff will call the number listed on your records 48-72 hours following your procedure to check on you and address any questions or concerns that you may have regarding the information given to you following your procedure. If we do not reach you, we will leave a message.  We will attempt to reach you two times.  During this call, we will ask if you have developed any symptoms of COVID 19. If you develop any symptoms (ie: fever, flu-like symptoms, shortness of breath, cough etc.) before then, please call [phone].  If you test positive for Covid 19 in the 2 weeks post procedure, please call and report this information to [place].    If any biopsies were taken you will be contacted by phone or by letter within the next 1-3 weeks.  Please call us at [phone] if you have not heard about the biopsies in 3 weeks.    SIGNATURES/CONFIDENTIALITY: You and/or your care partner have signed paperwork which will be entered into your electronic medical record.  These signatures attest to the  fact that that the information above on your After Visit Summary has been reviewed and is understood.  Full responsibility of the confidentiality of this discharge information lies with you and/or your care-partner.

## 2019-12-21 NOTE — Progress Notes (Signed)
pt tolerated well. VSS. awake and to recovery. Report given to RN.  

## 2019-12-21 NOTE — Progress Notes (Signed)
Vs CW ° °

## 2019-12-23 ENCOUNTER — Telehealth: Payer: Self-pay | Admitting: *Deleted

## 2019-12-23 ENCOUNTER — Telehealth: Payer: Self-pay

## 2019-12-23 NOTE — Telephone Encounter (Signed)
  Follow up Call-  Call back number 12/21/2019  Post procedure Call Back phone  # [phone]  Permission to leave phone message Yes  Some recent data might be hidden     1st follow up call made.  NALM

## 2019-12-23 NOTE — Telephone Encounter (Signed)
  Follow up Call-  Call back number 12/21/2019  Post procedure Call Back phone  # [phone]  Permission to leave phone message Yes  Some recent data might be hidden     Patient questions:  Message left to call us if necessary.  Second call.

## 2019-12-28 ENCOUNTER — Encounter: Payer: Self-pay | Admitting: Gastroenterology

## 2020-01-16 ENCOUNTER — Other Ambulatory Visit: Payer: 59

## 2020-01-17 ENCOUNTER — Other Ambulatory Visit (INDEPENDENT_AMBULATORY_CARE_PROVIDER_SITE_OTHER): Payer: 59

## 2020-01-17 ENCOUNTER — Other Ambulatory Visit: Payer: Self-pay

## 2020-01-17 DIAGNOSIS — E785 Hyperlipidemia, unspecified: Secondary | ICD-10-CM

## 2020-01-17 LAB — LIPID PANEL
Cholesterol: 230 mg/dL — ABNORMAL HIGH (ref 0–200)
HDL: 36.3 mg/dL — ABNORMAL LOW (ref >39.00)
LDL Cholesterol: 168 mg/dL — ABNORMAL HIGH (ref 0–99)
NonHDL: 194.15
Total CHOL/HDL Ratio: 6
Triglycerides: 131 mg/dL (ref 0.0–149.0)
VLDL: 26.2 mg/dL (ref 0.0–40.0)

## 2020-01-18 ENCOUNTER — Telehealth: Payer: Self-pay | Admitting: Medical

## 2020-01-18 MED ORDER — ATORVASTATIN CALCIUM 10 MG PO TABS: 10.0000 mg | ORAL_TABLET | Freq: Every day | ORAL | 3 refills | Status: DC

## 2020-01-18 NOTE — Telephone Encounter (Signed)
Rx atorvastatin sent to pt pharmacy. 

## 2020-01-23 ENCOUNTER — Other Ambulatory Visit: Payer: 59

## 2020-04-19 ENCOUNTER — Ambulatory Visit: Payer: 59

## 2020-04-20 ENCOUNTER — Telehealth: Payer: Self-pay | Admitting: Medical

## 2020-04-20 ENCOUNTER — Other Ambulatory Visit: Payer: Self-pay

## 2020-04-20 ENCOUNTER — Ambulatory Visit (HOSPITAL_BASED_OUTPATIENT_CLINIC_OR_DEPARTMENT_OTHER)
Admission: RE | Admit: 2020-04-20 | Discharge: 2020-04-20 | Disposition: A | Discharge status: Home / Self Care | Payer: 59 | Source: Ambulatory Visit | Attending: Medical | Admitting: Medical

## 2020-04-20 ENCOUNTER — Telehealth (INDEPENDENT_AMBULATORY_CARE_PROVIDER_SITE_OTHER): Payer: 59 | Admitting: Medical

## 2020-04-20 DIAGNOSIS — M25559 Pain in unspecified hip: Secondary | ICD-10-CM | POA: Diagnosis present

## 2020-04-20 NOTE — Telephone Encounter (Signed)
Referral to sports med placed.

## 2020-04-20 NOTE — Patient Instructions (Addendum)
For rt hip pain would recommend getting xray of rt hip to assess joint space. For pain can use low dose ibuprofen or can use tylenol as well.  Will follow xray and let you the results. If xray negative could refer you to sports med.  Follow up date to be determined after xray review.

## 2020-04-20 NOTE — Progress Notes (Signed)
   Subjective:    Patient ID: Karen Merritt, female    DOB: November 25, 1970, 50 y.o.   MRN: [id number]  HPI  Virtual Visit via Video Note  I connected with Zada Finders on 04/20/20 at 10:00 AM EDT by a video enabled telemedicine application and verified that I am speaking with the correct person using two identifiers.  Location: Patient: home Provider: office    Participants- pt and myself.  Pt did not check her blood pressure or vitals. Will go to family house and borrow bp cuff. Then my chart me later with vitals.  I discussed the limitations of evaluation and management by telemedicine and the availability of in person appointments. The patient expressed understanding and agreed to proceed.   History of Present Illness:   Rt hip pain for 2 weeks. Pt states pain is constant. Thought intensity varies depending on movement/position. Pt states yesterday pain was in hip and little bit down her thigh. No injury or trauma. Pt tried icey hot patch on area.  Pt early on used otc ibuprofen 200 mg and it helped some.  Pt states 5/10 level pain.   Observations/Objective: General-no acute distress, pleasant, oriented. Lungs- on inspection lungs appear unlabored. Neck- no tracheal deviation or jvd on inspection. Neuro- gross motor function appears intact. Rt hip- pt point to lateral hip area as source of pain. Some pain on rotation.    Assessment and Plan: For rt hip pain would recommend getting xray of rt hip to assess joint space. For pain can use low dose ibuprofen or can use tylenol as well.  Will follow xray and let you the results. If xray negative could refer you to sports med.  Follow up date to be determined after xray review.  Follow Up Instructions:    I discussed the assessment and treatment plan with the patient. The patient was provided an opportunity to ask questions and all were answered. The patient agreed with the plan and demonstrated an understanding of the  instructions.   The patient was advised to call back or seek an in-person evaluation if the symptoms worsen or if the condition fails to improve as anticipated.     Mackie Pai, PA-C   Time spent with patient today was 20  minutes which consisted of chart revdiew, discussing diagnosis, work up treatment and documentation.  Review of Systems     Objective:   Physical Exam        Assessment & Plan:

## 2020-06-01 ENCOUNTER — Ambulatory Visit: Payer: 59 | Attending: Internal Medicine

## 2020-06-01 ENCOUNTER — Other Ambulatory Visit: Payer: Self-pay

## 2020-06-01 ENCOUNTER — Other Ambulatory Visit (HOSPITAL_BASED_OUTPATIENT_CLINIC_OR_DEPARTMENT_OTHER): Payer: Self-pay

## 2020-06-01 DIAGNOSIS — Z23 Encounter for immunization: Secondary | ICD-10-CM

## 2020-06-01 MED ORDER — PFIZER-BIONT COVID-19 VAC-TRIS 30 MCG/0.3ML IM SUSP
INTRAMUSCULAR | 0 refills | Status: DC
Start: 2020-06-01 — End: 2022-10-27
  Filled 2020-06-01: qty 0.3, 1d supply, fill #0

## 2020-06-01 NOTE — Progress Notes (Signed)
   Covid-19 Vaccination Clinic  Name:  Karen Merritt    MRN: [id number] DOB: 1970-04-19  06/01/2020  [name] was observed post Covid-19 immunization for 15 minutes without incident. She was provided with Vaccine Information Sheet and instruction to access the V-Safe system.   [name] was instructed to call 911 with any severe reactions post vaccine: Marland Kitchen Difficulty breathing  . Swelling of face and throat  . A fast heartbeat  . A bad rash all over body  . Dizziness and weakness   Immunizations Administered    Name Date Dose VIS Date Route   PFIZER Comrnaty(Gray TOP) Covid-19 Vaccine 06/01/2020 10:43 AM 0.3 mL 12/29/2019 Intramuscular   Manufacturer: [employer], [employer]   Lot: [id number]   NDC: [phone]

## 2020-06-13 ENCOUNTER — Ambulatory Visit (INDEPENDENT_AMBULATORY_CARE_PROVIDER_SITE_OTHER): Payer: 59 | Admitting: Family Medicine

## 2020-06-13 ENCOUNTER — Other Ambulatory Visit: Payer: Self-pay

## 2020-06-13 VITALS — BP 118/82 | Ht 66.0 in | Wt 190.0 lb

## 2020-06-13 DIAGNOSIS — M5431 Sciatica, right side: Secondary | ICD-10-CM | POA: Diagnosis not present

## 2020-06-13 MED ORDER — MELOXICAM 15 MG PO TABS: ORAL_TABLET | ORAL | 0 refills | Status: DC

## 2020-06-13 NOTE — Progress Notes (Signed)
PCP: Mackie Pai, PA-C  Subjective:   HPI: Patient is a 50 y.o. female here for hip pain and knee pain.  Right hip and knee pain 2 months of right hip pain on back side of hip. Pain on the medial side of knee. Pain occurs with pushing off with foot on sleeping on that side. Endorses numbness that radiates to anterior thigh with these movements.  She has tried icy hot and voltaren gel with some relief. Denies any recent injury or trauma. [place] for hobby. Pain is mostly posterior right hip and worse with sitting. Difficulty getting out of the car at times. She denies any groin pain. No loss of bowel or bladder function. No trauma or fall or inciting event. Started insidiously about 2 months ago in March. She has never had pain like this before.  Past Medical History:  Diagnosis Date  . Chest pain    a. non-cardiac 2005, 2012.  Marland Kitchen Hemorrhoids 11-21-2009   colonoscopy  . 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 Hypothyroidism    a. s/p thyroidectomy ~ 1998  . Palpitations     Current Outpatient Medications on File Prior to Visit  Medication Sig Dispense Refill  . acetaminophen (TYLENOL) 500 MG tablet Take 500 mg by mouth every 6 (six) hours as needed for headache (pain). (Patient not taking: Reported on 12/21/2019)    . AMBULATORY NON FORMULARY MEDICATION Nitroglycerine ointment 0.125 %  Apply a pea sized amount internally four times daily. Dispense 30 GM zero refill (Patient not taking: Reported on 12/21/2019) 30 g 0  . atorvastatin (LIPITOR) 10 MG tablet Take 1 tablet (10 mg total) by mouth daily. 90 tablet 3  . COVID-19 mRNA Vac-TriS, Pfizer, (PFIZER-BIONT COVID-19 VAC-TRIS) SUSP injection Inject into the muscle. 0.3 mL 0  . cyclobenzaprine (FLEXERIL) 5 MG tablet Take 1 tablet (5 mg total) by mouth at bedtime. (Patient not taking: Reported on 12/21/2019) 4 tablet 0  . dibucaine (NUPERCAINAL) 1 % OINT as needed. (Patient not taking: Reported on 12/21/2019)    . ibuprofen  (ADVIL,MOTRIN) 200 MG tablet Take 400 mg by mouth every 6 (six) hours as needed for headache (pain).  (Patient not taking: Reported on 12/21/2019)    . levothyroxine (SYNTHROID) 100 MCG tablet TAKE 1 TABLET BY MOUTH  DAILY 90 tablet 3  . Melatonin 3 MG TABS Take 3 mg by mouth at bedtime as needed (sleep).     . Sod Picosulfate-Mag Ox-Cit Acd (CLENPIQ) 10-3.5-12 MG-GM -GM/160ML SOLN Take 1 kit by mouth as directed. 320 mL 0   Current Facility-Administered Medications on File Prior to Visit  Medication Dose Route Frequency Provider Last Rate Last Admin  . 0.9 %  sodium chloride infusion  500 mL Intravenous Continuous Cirigliano, Vito V, DO        Past Surgical History:  Procedure Laterality Date  . bone spur  6/11   right big toe  . SHOULDER ARTHROSCOPY WITH SUBACROMIAL DECOMPRESSION AND OPEN ROTATOR C Right 05/23/2013   Procedure: RIGHT SHOULDER ARTHROSCOPY WITH SUBACROMIAL DECOMPRESSION AND MINI OPEN ROTATOR CUFF REPAIR, AND DISTAL CLAVICLE RESECTION;  Surgeon: Augustin Schooling, MD;  Location: [facility];  Service: Orthopedics;  Laterality: Right;  . stress cardiolite  05/23/03  . THYROIDECTOMY  12/29/96  . TOTAL ABDOMINAL HYSTERECTOMY  2008    No Known Allergies  Social History   Socioeconomic History  . Marital status: Divorced    Spouse name: Not on file  . Number of children: 2  .  Years of education: 15  . Highest education level: Some college, no degree  Occupational History  . Occupation: Ecologist: [employer]  Tobacco Use  . Smoking status: Never Smoker  . Smokeless tobacco: Never Used  Vaping Use  . Vaping Use: Never used  Substance and Sexual Activity  . Alcohol use: No  . Drug use: No  . Sexual activity: Not on file  Other Topics Concern  . Not on file  Social History Narrative   Lives in [place].  Active, walks/jogs.  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   Right-handed   Caffeine: occasionally      Was recently participating in water aerobics,  though recently has not been able, due to headaches.   Social Determinants of Health   Financial Resource Strain: Not on file  Food Insecurity: Not on file  Transportation Needs: Not on file  Physical Activity: Not on file  Stress: Not on file  Social Connections: Not on file  Intimate Partner Violence: Not on file    Family History  Problem Relation Age of Onset  . Hypertension Mother   . Colon polyps Mother   . Coronary artery disease Mother        alive @ 62.  Marland Kitchen Heart disease Mother 9       10/20/13  . Stroke Mother   . Heart attack Mother   . Other Father        alive and well @ 27.  Marland Kitchen Hypertension Father   . Hypertension Sister   . Migraines Brother   . Migraines Sister   . Bone cancer Maternal Aunt   . Thyroid disease Maternal Aunt   . Thyroid cancer Maternal Aunt   . Liver cancer Maternal Aunt   . Hypertension Maternal Grandmother   . Diabetes Maternal Grandmother   . Coronary artery disease Maternal Grandfather   . Heart attack Maternal Grandfather 60  . Coronary artery disease Maternal Uncle   . Hypertension Maternal Uncle   . Heart disease Maternal Aunt   . Glaucoma Paternal Grandmother   . Liver disease Paternal Grandfather   . Colon cancer Neg Hx     BP 118/82   Ht _0  (1.676 m)   Wt 190 lb (86.2 kg)   BMI 30.67 kg/m   No flowsheet data found.  No flowsheet data found.  Review of Systems: See HPI above.     Objective:  Physical Exam:  Gen: NAD, comfortable in exam room  Right hip No deformity. FROM with 5/5 strength. tenderness to palpation over piriformis muscles NVI distally. Negative logroll, negative SLR test. Negative faber, fadir. Pain reproduced to anterior thigh with piriformis stretches.  Right knee No gross deformity, ecchymoses, swelling. No TTP at joint like. Tender to palpation at the insertion location of vastus medialis. FROM with normal strength. Negative ant/post drawers. Negative valgus/varus testing. Negative  mcmurrays, apleys. NV intact distally.  Assessment & Plan:  1. Right hip, knee pain Duration of 2 months without known injury/trauma and without much improvement. Consider sciatic nerve impingement v. Nerve impingement at level of lumbar spine (L3-4). Will treat conservative at this time given reassuring exam w/o red flag findings. Consider MRI if no improvement at follow up.   Plan:  -PT -Meloxicam -F/u 4 weeks; consider MRI  Resident Attestation   I saw and evaluated the patient, performing the key elements of the service.I  personally performed or re-performed the history, physical exam, and medical decision  making activities of this service and have verified that the service and findings are accurately documented in the resident's note. I developed the management plan that is described in the resident's note, and I agree with the content, with my edits above.  Harolyn Rutherford, DO [facility], PGY-4  Addendum:  I was the preceptor for this visit and available for immediate consultation.  Karlton Lemon MD Kirt Boys

## 2020-06-13 NOTE — Patient Instructions (Signed)
It was great to meet you today! Thank you for letting me participate in your care!  Today, we discussed your right hip pain which I think could be Sciatica or coming from your low back. I want to try some conservative management with formal physical therapy and Meloxciam. Don't take any other oral NSAIDs with Meloxicam and take it as directed. If you get worse call me before 4 weeks and I will order an MRI. If you are getting better just plan on seeing me in 4 weeks.  Be well, Harolyn Rutherford, DO PGY-4, Sports Medicine Fellow [place]

## 2020-07-18 ENCOUNTER — Ambulatory Visit: Payer: 59 | Admitting: Family Medicine

## 2020-09-16 ENCOUNTER — Other Ambulatory Visit: Payer: Self-pay | Admitting: Endocrinology

## 2020-10-24 ENCOUNTER — Ambulatory Visit: Payer: 59 | Attending: Internal Medicine

## 2020-10-24 ENCOUNTER — Other Ambulatory Visit (HOSPITAL_BASED_OUTPATIENT_CLINIC_OR_DEPARTMENT_OTHER): Payer: Self-pay

## 2020-10-24 DIAGNOSIS — Z23 Encounter for immunization: Secondary | ICD-10-CM

## 2020-10-24 MED ORDER — PFIZER COVID-19 VAC BIVALENT 30 MCG/0.3ML IM SUSP
INTRAMUSCULAR | 0 refills | Status: DC
Start: 2020-10-24 — End: 2023-08-25
  Filled 2020-10-24: qty 0.3, 1d supply, fill #0

## 2020-10-24 NOTE — Progress Notes (Signed)
   Covid-19 Vaccination Clinic  Name:  Karen Merritt    MRN: [id number] DOB: [birth year]  10/24/2020  [name] was observed post Covid-19 immunization for 15 minutes without incident. She was provided with Vaccine Information Sheet and instruction to access the V-Safe system.   [name] was instructed to call 911 with any severe reactions post vaccine: Difficulty breathing  Swelling of face and throat  A fast heartbeat  A bad rash all over body  Dizziness and weakness

## 2020-11-26 ENCOUNTER — Ambulatory Visit (INDEPENDENT_AMBULATORY_CARE_PROVIDER_SITE_OTHER): Payer: 59 | Admitting: Endocrinology

## 2020-11-26 ENCOUNTER — Other Ambulatory Visit: Payer: Self-pay

## 2020-11-26 VITALS — BP 120/78 | HR 69 | Ht 66.0 in | Wt 207.0 lb

## 2020-11-26 DIAGNOSIS — E890 Postprocedural hypothyroidism: Secondary | ICD-10-CM

## 2020-11-26 LAB — T4, FREE: Free T4: 1.06 ng/dL (ref 0.60–1.60)

## 2020-11-26 LAB — TSH: TSH: 7.54 u[IU]/mL — ABNORMAL HIGH (ref 0.35–5.50)

## 2020-11-26 MED ORDER — LEVOTHYROXINE SODIUM 112 MCG PO TABS: 112.0000 ug | ORAL_TABLET | Freq: Every day | ORAL | 3 refills | Status: DC

## 2020-11-26 NOTE — Patient Instructions (Addendum)
Blood tests are requested for you today.  We'll let you know about the results.   It is best to never miss the medication.  However, if you do miss it, next best is to double up the next time.   Please come back for a follow-up appointment in 1 year.

## 2020-11-26 NOTE — Progress Notes (Signed)
Subjective:    Patient ID: Karen Merritt, female    DOB: 10-12-1970, 50 y.o.   MRN: [id number]  HPI Pt returns for f/u of postsurgical hypothyroidism (pt had thyroidectomy in 1998 due to the mass effect of a multinodular goiter; CT (2019) showed status post thyroidectomy). pt states she feels well in general.  She says she never misses the synthroid.    Past Medical History:  Diagnosis Date   Chest pain    a. non-cardiac 2005, 2012.   Hemorrhoids 11-21-2009   colonoscopy   Hypertrophy, anal papillae 11-21-2009   colonoscopy   Hypothyroidism    a. s/p thyroidectomy ~ 1998   Palpitations     Past Surgical History:  Procedure Laterality Date   bone spur  6/11   right big toe   SHOULDER ARTHROSCOPY WITH SUBACROMIAL DECOMPRESSION AND OPEN ROTATOR C Right 05/23/2013   Procedure: RIGHT SHOULDER ARTHROSCOPY WITH SUBACROMIAL DECOMPRESSION AND MINI OPEN ROTATOR CUFF REPAIR, AND DISTAL CLAVICLE RESECTION;  Surgeon: Augustin Schooling, MD;  Location: [facility];  Service: Orthopedics;  Laterality: Right;   stress cardiolite  05/23/03   THYROIDECTOMY  12/29/96   TOTAL ABDOMINAL HYSTERECTOMY  2008    Social History   Socioeconomic History   Marital status: Divorced    Spouse name: Not on file   Number of children: 2   Years of education: 14   Highest education level: Some college, no degree  Occupational History   Occupation: Ecologist: [employer]  Tobacco Use   Smoking status: Never   Smokeless tobacco: Never  Vaping Use   Vaping Use: Never used  Substance and Sexual Activity   Alcohol use: No   Drug use: No   Sexual activity: Not on file  Other Topics Concern   Not on file  Social History Narrative   Lives in [place].  Active, walks/jogs.  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   Social Determinants of Health    Financial Resource Strain: Not on file  Food Insecurity: Not on file  Transportation Needs: Not on file  Physical Activity: Not on file  Stress: Not on file  Social Connections: Not on file  Intimate Partner Violence: Not on file    Current Outpatient Medications on File Prior to Visit  Medication Sig Dispense Refill   acetaminophen (TYLENOL) 500 MG tablet Take 500 mg by mouth every 6 (six) hours as needed for headache (pain).     AMBULATORY NON FORMULARY MEDICATION Nitroglycerine ointment 0.125 %  Apply a pea sized amount internally four times daily. Dispense 30 GM zero refill 30 g 0   atorvastatin (LIPITOR) 10 MG tablet Take 1 tablet (10 mg total) by mouth daily. 90 tablet 3   COVID-19 mRNA bivalent vaccine, Pfizer, (PFIZER COVID-19 VAC BIVALENT) injection Inject into the muscle. 0.3 mL 0   COVID-19 mRNA Vac-TriS, Pfizer, (PFIZER-BIONT COVID-19 VAC-TRIS) SUSP injection Inject into the muscle. 0.3 mL 0   cyclobenzaprine (FLEXERIL) 5 MG tablet Take 1 tablet (5 mg total) by mouth at bedtime. 4 tablet 0   dibucaine (NUPERCAINAL) 1 % OINT as needed.     ibuprofen (ADVIL,MOTRIN) 200 MG tablet Take 400 mg by mouth every 6 (six) hours as needed for headache (pain).     Melatonin 3 MG TABS Take 3 mg by mouth  at bedtime as needed (sleep).      meloxicam (MOBIC) 15 MG tablet Take 1 tablet daily with food for 7 days. Then take as needed. 40 tablet 0   Sod Picosulfate-Mag Ox-Cit Acd (CLENPIQ) 10-3.5-12 MG-GM -GM/160ML SOLN Take 1 kit by mouth as directed. 320 mL 0   Current Facility-Administered Medications on File Prior to Visit  Medication Dose Route Frequency Provider Last Rate Last Admin   0.9 %  sodium chloride infusion  500 mL Intravenous Continuous Cirigliano, Vito V, DO        No Known Allergies  Family History  Problem Relation Age of Onset   Hypertension Mother    Colon polyps Mother    Coronary artery disease Mother        alive @ 42.   Heart disease Mother 23       10/20/13    Stroke Mother    Heart attack Mother    Other Father        alive and well @ 33.   Hypertension Father    Hypertension Sister    Migraines Brother    Migraines Sister    Bone cancer Maternal Aunt    Thyroid disease Maternal Aunt    Thyroid cancer Maternal Aunt    Liver cancer Maternal Aunt    Hypertension Maternal Grandmother    Diabetes Maternal Grandmother    Coronary artery disease Maternal Grandfather    Heart attack Maternal Grandfather 60   Coronary artery disease Maternal Uncle    Hypertension Maternal Uncle    Heart disease Maternal Aunt    Glaucoma Paternal Grandmother    Liver disease Paternal Grandfather    Colon cancer Neg Hx     BP 120/78 (BP Location: Right Arm, Patient Position: Sitting, Cuff Size: Large)   Pulse 69   Ht 5' 6"  (1.676 m)   Wt 207 lb (93.9 kg)   SpO2 93%   BMI 33.41 kg/m    Review of Systems     Objective:   Physical Exam VITAL SIGNS:  See vs page.  GENERAL: no distress.  Neck: a healed scar is present.  I do not appreciate a nodule in the thyroid or elsewhere in the neck  Lab Results  Component Value Date   TSH 7.54 (H) 11/26/2020       Assessment & Plan:  Hypothyroidism: uncontrolled.  I have sent a prescription to your pharmacy, to increase synthroid.

## 2021-03-29 ENCOUNTER — Telehealth: Payer: Self-pay

## 2021-03-29 NOTE — Telephone Encounter (Signed)
I have called patient regarding recall colonoscopy, unable to leave message due to mailbox being full. ?

## 2021-06-10 ENCOUNTER — Ambulatory Visit: Payer: 59 | Admitting: Endocrinology

## 2021-07-19 ENCOUNTER — Ambulatory Visit (INDEPENDENT_AMBULATORY_CARE_PROVIDER_SITE_OTHER): Payer: 59 | Admitting: Medical

## 2021-07-19 VITALS — BP 124/82 | HR 56 | Resp 18 | Ht 66.0 in | Wt 177.6 lb

## 2021-07-19 DIAGNOSIS — Z0000 Encounter for general adult medical examination without abnormal findings: Secondary | ICD-10-CM

## 2021-07-19 LAB — LIPID PANEL
Cholesterol: 190 mg/dL (ref 0–200)
HDL: 37.5 mg/dL — ABNORMAL LOW (ref >39.00)
LDL Cholesterol: 137 mg/dL — ABNORMAL HIGH (ref 0–99)
NonHDL: 152.64
Total CHOL/HDL Ratio: 5
Triglycerides: 80 mg/dL (ref 0.0–149.0)
VLDL: 16 mg/dL (ref 0.0–40.0)

## 2021-07-19 LAB — COMPREHENSIVE METABOLIC PANEL
ALT: 16 U/L (ref 0–35)
AST: 16 U/L (ref 0–37)
Albumin: 4.1 g/dL (ref 3.5–5.2)
Alkaline Phosphatase: 70 U/L (ref 39–117)
BUN: 12 mg/dL (ref 6–23)
CO2: 31 mEq/L (ref 19–32)
Calcium: 9.2 mg/dL (ref 8.4–10.5)
Chloride: 108 mEq/L (ref 96–112)
Creatinine, Ser: 0.89 mg/dL (ref 0.40–1.20)
GFR: 74.15 mL/min (ref >60.00)
Glucose, Bld: 92 mg/dL (ref 70–99)
Potassium: 4.2 mEq/L (ref 3.5–5.1)
Sodium: 142 mEq/L (ref 135–145)
Total Bilirubin: 0.4 mg/dL (ref 0.2–1.2)
Total Protein: 6.5 g/dL (ref 6.0–8.3)

## 2021-07-19 LAB — CBC WITH DIFFERENTIAL/PLATELET
Basophils Absolute: 0 10*3/uL (ref 0.0–0.1)
Basophils Relative: 0.7 % (ref 0.0–3.0)
Eosinophils Absolute: 0.1 10*3/uL (ref 0.0–0.7)
Eosinophils Relative: 1.9 % (ref 0.0–5.0)
HCT: 40.1 % (ref 36.0–46.0)
Hemoglobin: 13.6 g/dL (ref 12.0–15.0)
Lymphocytes Relative: 38.5 % (ref 12.0–46.0)
Lymphs Abs: 2.4 10*3/uL (ref 0.7–4.0)
MCHC: 33.8 g/dL (ref 30.0–36.0)
MCV: 82.4 fl (ref 78.0–100.0)
Monocytes Absolute: 0.4 10*3/uL (ref 0.1–1.0)
Monocytes Relative: 7.3 % (ref 3.0–12.0)
Neutro Abs: 3.2 10*3/uL (ref 1.4–7.7)
Neutrophils Relative %: 51.6 % (ref 43.0–77.0)
Platelets: 200 10*3/uL (ref 150.0–400.0)
RBC: 4.87 Mil/uL (ref 3.87–5.11)
RDW: 14.3 % (ref 11.5–15.5)
WBC: 6.1 10*3/uL (ref 4.0–10.5)

## 2021-07-19 MED ORDER — DIBUCAINE (PERIANAL) 1 % EX OINT: 1.0000 | TOPICAL_OINTMENT | CUTANEOUS | 1 refills | Status: DC | PRN

## 2021-07-19 NOTE — Patient Instructions (Addendum)
For you wellness exam today I have ordered cbc, cmp and lipid panel.  Not decided yet on shingrix vaccine. Can call back later to get scheduled for series.  Recommend exercise and healthy diet.  We will let you know lab results as they come in.  Follow up date appointment will be determined after lab review.    Preventive Care 29-53 Years Old, Female Preventive care refers to lifestyle choices and visits with your health care provider that can promote health and wellness. Preventive care visits are also called wellness exams. What can I expect for my preventive care visit? Counseling Your health care provider may ask you questions about your: Medical history, including: Past medical problems. Family medical history. Pregnancy history. Current health, including: Menstrual cycle. Method of birth control. Emotional well-being. Home life and relationship well-being. Sexual activity and sexual health. Lifestyle, including: Alcohol, nicotine or tobacco, and drug use. Access to firearms. Diet, exercise, and sleep habits. Work and work Statistician. Sunscreen use. Safety issues such as seatbelt and bike helmet use. Physical exam Your health care provider will check your: Height and weight. These may be used to calculate your BMI (body mass index). BMI is a measurement that tells if you are at a healthy weight. Waist circumference. This measures the distance around your waistline. This measurement also tells if you are at a healthy weight and may help predict your risk of certain diseases, such as type 2 diabetes and high blood pressure. Heart rate and blood pressure. Body temperature. Skin for abnormal spots. What immunizations do I need?  Vaccines are usually given at various ages, according to a schedule. Your health care provider will recommend vaccines for you based on your age, medical history, and lifestyle or other factors, such as travel or where you work. What tests do I  need? Screening Your health care provider may recommend screening tests for certain conditions. This may include: Lipid and cholesterol levels. Diabetes screening. This is done by checking your blood sugar (glucose) after you have not eaten for a while (fasting). Pelvic exam and Pap test. Hepatitis B test. Hepatitis C test. HIV (human immunodeficiency virus) test. STI (sexually transmitted infection) testing, if you are at risk. Lung cancer screening. Colorectal cancer screening. Mammogram. Talk with your health care provider about when you should start having regular mammograms. This may depend on whether you have a family history of breast cancer. BRCA-related cancer screening. This may be done if you have a family history of breast, ovarian, tubal, or peritoneal cancers. Bone density scan. This is done to screen for osteoporosis. Talk with your health care provider about your test results, treatment options, and if necessary, the need for more tests. Follow these instructions at home: Eating and drinking  Eat a diet that includes fresh fruits and vegetables, whole grains, lean protein, and low-fat dairy products. Take vitamin and mineral supplements as recommended by your health care provider. Do not drink alcohol if: Your health care provider tells you not to drink. You are pregnant, may be pregnant, or are planning to become pregnant. If you drink alcohol: Limit how much you have to 0-1 drink a day. Know how much alcohol is in your drink. In the [place] one drink equals one 12 oz bottle of beer (355 mL), one 5 oz glass of wine (148 mL), or one 1 oz glass of hard liquor (44 mL). Lifestyle Brush your teeth every morning and night with fluoride toothpaste. Floss one time each day. Exercise for at least  30 minutes 5 or more days each week. Do not use any products that contain nicotine or tobacco. These products include cigarettes, chewing tobacco, and vaping devices, such as  e-cigarettes. If you need help quitting, ask your health care provider. Do not use drugs. If you are sexually active, practice safe sex. Use a condom or other form of protection to prevent STIs. If you do not wish to become pregnant, use a form of birth control. If you plan to become pregnant, see your health care provider for a prepregnancy visit. Take aspirin only as told by your health care provider. Make sure that you understand how much to take and what form to take. Work with your health care provider to find out whether it is safe and beneficial for you to take aspirin daily. Find healthy ways to manage stress, such as: Meditation, yoga, or listening to music. Journaling. Talking to a trusted person. Spending time with friends and family. Minimize exposure to UV radiation to reduce your risk of skin cancer. Safety Always wear your seat belt while driving or riding in a vehicle. Do not drive: If you have been drinking alcohol. Do not ride with someone who has been drinking. When you are tired or distracted. While texting. If you have been using any mind-altering substances or drugs. Wear a helmet and other protective equipment during sports activities. If you have firearms in your house, make sure you follow all gun safety procedures. Seek help if you have been physically or sexually abused. What's next? Visit your health care provider once a year for an annual wellness visit. Ask your health care provider how often you should have your eyes and teeth checked. Stay up to date on all vaccines. This information is not intended to replace advice given to you by your health care provider. Make sure you discuss any questions you have with your health care provider. Document Revised: 07/04/2020 Document Reviewed: 07/04/2020 Elsevier Patient Education  [place].

## 2021-07-19 NOTE — Progress Notes (Signed)
Subjective:    Patient ID: Karen Merritt, female    DOB: [birth year], 51 y.o.   MRN: [id number]  HPI Her for wellness exam. Pt does walk dog daily. Trying to eat healthy. Less sugar and low cholesterol diet. 1 cup coffee a day.    Up to date on colonoscopy.  Up to date on mammogram and pap.  Pt not up to date on shingles. She is not ready to get yet.    Review of Systems  Constitutional:  Negative for chills, fatigue and fever.  Respiratory:  Negative for cough, chest tightness, shortness of breath and wheezing.   Cardiovascular:  Negative for chest pain and palpitations.  Gastrointestinal:  Positive for nausea. Negative for abdominal pain.  Musculoskeletal:  Negative for back pain, joint swelling, myalgias and neck stiffness.  Skin:  Negative for pallor and rash.  Neurological:  Negative for dizziness, speech difficulty, weakness, numbness and headaches.  Psychiatric/Behavioral:  Negative for behavioral problems, decreased concentration and dysphoric mood.     Past Medical History:  Diagnosis Date   Chest pain    a. non-cardiac 2005, 2012.   Hemorrhoids 11-21-2009   colonoscopy   Hypertrophy, anal papillae 11-21-2009   colonoscopy   Hypothyroidism    a. s/p thyroidectomy ~ 1998   Palpitations      Social History   Socioeconomic History   Marital status: Divorced    Spouse name: Not on file   Number of children: 2   Years of education: 14   Highest education level: Some college, no degree  Occupational History   Occupation: Ecologist: [employer]  Tobacco Use   Smoking status: Never   Smokeless tobacco: Never  Vaping Use   Vaping Use: Never used  Substance and Sexual Activity   Alcohol use: No   Drug use: No   Sexual activity: Not on file  Other Topics Concern   Not on file  Social History Narrative   Lives in [place].  Active, walks/jogs.  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   Right-handed   Caffeine: occasionally      Was  recently participating in water aerobics, though recently has not been able, due to headaches.   Social Determinants of Health   Financial Resource Strain: Not on file  Food Insecurity: Not on file  Transportation Needs: Not on file  Physical Activity: Not on file  Stress: Not on file  Social Connections: Not on file  Intimate Partner Violence: Not on file    Past Surgical History:  Procedure Laterality Date   bone spur  6/11   right big toe   SHOULDER ARTHROSCOPY WITH SUBACROMIAL DECOMPRESSION AND OPEN ROTATOR C Right 05/23/2013   Procedure: RIGHT SHOULDER ARTHROSCOPY WITH SUBACROMIAL DECOMPRESSION AND MINI OPEN ROTATOR CUFF REPAIR, AND DISTAL CLAVICLE RESECTION;  Surgeon: Augustin Schooling, MD;  Location: [facility];  Service: Orthopedics;  Laterality: Right;   stress cardiolite  05/23/03   THYROIDECTOMY  12/29/96   TOTAL ABDOMINAL HYSTERECTOMY  2008    Family History  Problem Relation Age of Onset   Hypertension Mother    Colon polyps Mother    Coronary artery disease Mother        alive @ 88.   Heart disease Mother 37       10/20/13   Stroke Mother    Heart attack Mother    Other Father        alive and well @ 11.  Hypertension Father    Hypertension Sister    Migraines Brother    Migraines Sister    Bone cancer Maternal Aunt    Thyroid disease Maternal Aunt    Thyroid cancer Maternal Aunt    Liver cancer Maternal Aunt    Hypertension Maternal Grandmother    Diabetes Maternal Grandmother    Coronary artery disease Maternal Grandfather    Heart attack Maternal Grandfather 60   Coronary artery disease Maternal Uncle    Hypertension Maternal Uncle    Heart disease Maternal Aunt    Glaucoma Paternal Grandmother    Liver disease Paternal Grandfather    Colon cancer Neg Hx     No Known Allergies  Current Outpatient Medications on File Prior to Visit  Medication Sig Dispense Refill   acetaminophen (TYLENOL) 500 MG tablet Take 500 mg by mouth every 6 (six) hours as needed  for headache (pain).     AMBULATORY NON FORMULARY MEDICATION Nitroglycerine ointment 0.125 %  Apply a pea sized amount internally four times daily. Dispense 30 GM zero refill 30 g 0   atorvastatin (LIPITOR) 10 MG tablet Take 1 tablet (10 mg total) by mouth daily. 90 tablet 3   COVID-19 mRNA bivalent vaccine, Pfizer, (PFIZER COVID-19 VAC BIVALENT) injection Inject into the muscle. 0.3 mL 0   COVID-19 mRNA Vac-TriS, Pfizer, (PFIZER-BIONT COVID-19 VAC-TRIS) SUSP injection Inject into the muscle. 0.3 mL 0   cyclobenzaprine (FLEXERIL) 5 MG tablet Take 1 tablet (5 mg total) by mouth at bedtime. 4 tablet 0   dibucaine (NUPERCAINAL) 1 % OINT as needed.     ibuprofen (ADVIL,MOTRIN) 200 MG tablet Take 400 mg by mouth every 6 (six) hours as needed for headache (pain).     levothyroxine (SYNTHROID) 112 MCG tablet Take 1 tablet (112 mcg total) by mouth daily. 90 tablet 3   Melatonin 3 MG TABS Take 3 mg by mouth at bedtime as needed (sleep).      meloxicam (MOBIC) 15 MG tablet Take 1 tablet daily with food for 7 days. Then take as needed. 40 tablet 0   Sod Picosulfate-Mag Ox-Cit Acd (CLENPIQ) 10-3.5-12 MG-GM -GM/160ML SOLN Take 1 kit by mouth as directed. 320 mL 0   Current Facility-Administered Medications on File Prior to Visit  Medication Dose Route Frequency Provider Last Rate Last Admin   0.9 %  sodium chloride infusion  500 mL Intravenous Continuous Cirigliano, Vito V, DO        BP 124/82   Pulse (!) 56   Resp 18   Ht 5' 6"  (1.676 m)   Wt 177 lb 9.6 oz (80.6 kg)   SpO2 100%   BMI 28.67 kg/m        Objective:   Physical Exam  General Mental Status- Alert. General Appearance- Not in acute distress.   Skin General: Color- Normal Color. Moisture- Normal Moisture.  Neck Carotid Arteries- Normal color. Moisture- Normal Moisture. No carotid bruits. No JVD.  Chest and Lung Exam Auscultation: Breath Sounds:-Normal.  Cardiovascular Auscultation:Rythm- Regular. Murmurs & Other Heart  Sounds:Auscultation of the heart reveals- No Murmurs.  Abdomen Inspection:-Inspeection Normal. Palpation/Percussion:Note:No mass. Palpation and Percussion of the abdomen reveal- Non Tender, Non Distended + BS, no rebound or guarding.    Neurologic Cranial Nerve exam:- CN III-XII intact(No nystagmus), symmetric smile. Drift Test:- No drift. Finger to Nose:- Normal/Intact Strength:- 5/5 equal and symmetric strength both upper and lower extremities.       Assessment & Plan:   Patient Instructions  For you  wellness exam today I have ordered cbc, cmp and lipid panel.  Not decided yet on shingrix vaccine. Can call back later to get scheduled for series.  Recommend exercise and healthy diet.  We will let you know lab results as they come in.  Follow up date appointment will be determined after lab review.      Mackie Pai, PA-C

## 2021-07-19 NOTE — Addendum Note (Signed)
Addended by: Anabel Halon on: 07/19/2021 08:32 AM   Modules accepted: Orders

## 2021-08-07 ENCOUNTER — Encounter: Payer: Self-pay | Admitting: Internal Medicine

## 2021-08-07 ENCOUNTER — Ambulatory Visit (INDEPENDENT_AMBULATORY_CARE_PROVIDER_SITE_OTHER): Payer: 59 | Admitting: Internal Medicine

## 2021-08-07 VITALS — BP 120/76 | HR 70 | Ht 66.0 in | Wt 176.8 lb

## 2021-08-07 DIAGNOSIS — E890 Postprocedural hypothyroidism: Secondary | ICD-10-CM

## 2021-08-07 LAB — TSH: TSH: 12.02 u[IU]/mL — ABNORMAL HIGH (ref 0.35–5.50)

## 2021-08-07 LAB — T4, FREE: Free T4: 0.92 ng/dL (ref 0.60–1.60)

## 2021-08-07 NOTE — Patient Instructions (Addendum)
Please continue Levothyroxine 100 mcg daily.  Take the thyroid hormone every day, with water, at least 30 minutes before breakfast, separated by at least 4 hours from: - acid reflux medications - calcium - iron - multivitamins  Please stop at the lab.  You should have an endocrinology follow-up appointment in 6 months. 

## 2021-08-07 NOTE — Progress Notes (Signed)
Patient ID: Karen Merritt, female   DOB: 11-17-1970, 51 y.o.   MRN: [id number]  HPI  Karen Merritt is a 51 y.o.-year-old female, returning for follow-up for postsurgical hypothyroidism.  She previously saw Dr. Loanne Drilling, last visit 1 year ago.  Pt. 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  Pt is on levothyroxine 100 mcg daily (Dr. Loanne Drilling recommended to increase the dose in 11/2020 - did not get the new dose...), taken: - in am - fasting - at least 30 min from b'fast - no calcium - no iron - no multivitamins - no PPIs - not on Biotin  I reviewed pt's thyroid tests: Lab Results  Component Value Date   TSH 7.54 (H) 11/26/2020   TSH 2.77 11/21/2019   TSH 2.37 11/15/2018   TSH 0.44 08/24/2017   TSH 1.90 10/14/2016   TSH 8.63 (H) 07/01/2016   TSH 2.16 12/28/2015   TSH 0.31 (L) 11/27/2015   TSH 0.31 (L) 10/29/2015   TSH 0.24 (L) 09/28/2015   FREET4 1.06 11/26/2020   FREET4 0.84 11/21/2019   FREET4 0.92 11/15/2018   FREET4 0.81 10/14/2016   FREET4 0.81 09/28/2015   FREET4 0.85 07/28/2014   FREET4 0.97 07/18/2014   FREET4 1.42 06/29/2009   T3FREE 3.0 09/28/2015   T3FREE 3.3 07/28/2014   T3FREE 3.0 07/18/2014   T3FREE 3.4 06/29/2009   Antithyroid antibodies: No results found for: "THGAB" No components found for: "TPOAB"  Pt denies: - weight gain - fatigue - cold intolerance - depression - constipation - dry skin  She has: - hair loss  Pt denies feeling nodules in neck, hoarseness, dysphagia/odynophagia.  She has + FH of thyroid disease in: Maternal aunt >> thyroid cancer.  No h/o radiation tx to head or neck. No use of iodine supplements or steroids  ROS: + See HPI  Past Medical History:  Diagnosis Date   Chest pain    a. non-cardiac 2005, 2012.   Hemorrhoids 11-21-2009   colonoscopy   Hypertrophy, anal papillae 11-21-2009   colonoscopy    Hypothyroidism    a. s/p thyroidectomy ~ 1998   Palpitations    Past Surgical History:  Procedure Laterality Date   bone spur  6/11   right big toe   SHOULDER ARTHROSCOPY WITH SUBACROMIAL DECOMPRESSION AND OPEN ROTATOR C Right 05/23/2013   Procedure: RIGHT SHOULDER ARTHROSCOPY WITH SUBACROMIAL DECOMPRESSION AND MINI OPEN ROTATOR CUFF REPAIR, AND DISTAL CLAVICLE RESECTION;  Surgeon: Augustin Schooling, MD;  Location: [facility];  Service: Orthopedics;  Laterality: Right;   stress cardiolite  05/23/03   THYROIDECTOMY  12/29/96   TOTAL ABDOMINAL HYSTERECTOMY  2008   Social History   Socioeconomic History   Marital status: Divorced    Spouse name: Not on file   Number of children: 2   Years of education: 14   Highest education level: Some college, no degree  Occupational History   Occupation: Ecologist: [employer]  Tobacco Use   Smoking status: Never   Smokeless tobacco: Never  Vaping Use   Vaping Use: Never used  Substance and Sexual Activity   Alcohol use: No   Drug use: No   Sexual activity: Not on file  Other Topics Concern   Not on file  Social History Narrative   Lives in [place].  Active, walks/jogs.  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   Social Determinants of Health   Financial Resource Strain: Not on file  Food Insecurity: Not on file  Transportation Needs: Not on file  Physical Activity: Not on file  Stress: Not on file  Social Connections: Not on file  Intimate Partner Violence: Not on file   Current Outpatient Medications on File Prior to Visit  Medication Sig Dispense Refill   acetaminophen (TYLENOL) 500 MG tablet Take 500 mg by mouth every 6 (six) hours as needed for headache (pain).     AMBULATORY NON FORMULARY MEDICATION Nitroglycerine ointment 0.125 %  Apply a pea sized amount internally four  times daily. Dispense 30 GM zero refill 30 g 0   atorvastatin (LIPITOR) 10 MG tablet Take 1 tablet (10 mg total) by mouth daily. 90 tablet 3   COVID-19 mRNA bivalent vaccine, Pfizer, (PFIZER COVID-19 VAC BIVALENT) injection Inject into the muscle. 0.3 mL 0   COVID-19 mRNA Vac-TriS, Pfizer, (PFIZER-BIONT COVID-19 VAC-TRIS) SUSP injection Inject into the muscle. 0.3 mL 0   cyclobenzaprine (FLEXERIL) 5 MG tablet Take 1 tablet (5 mg total) by mouth at bedtime. 4 tablet 0   dibucaine (NUPERCAINAL) 1 % OINT Place 1 Application rectally as needed for anal irritation. 28 g 1   ibuprofen (ADVIL,MOTRIN) 200 MG tablet Take 400 mg by mouth every 6 (six) hours as needed for headache (pain).     levothyroxine (SYNTHROID) 112 MCG tablet Take 1 tablet (112 mcg total) by mouth daily. 90 tablet 3   Melatonin 3 MG TABS Take 3 mg by mouth at bedtime as needed (sleep).      meloxicam (MOBIC) 15 MG tablet Take 1 tablet daily with food for 7 days. Then take as needed. 40 tablet 0   Sod Picosulfate-Mag Ox-Cit Acd (CLENPIQ) 10-3.5-12 MG-GM -GM/160ML SOLN Take 1 kit by mouth as directed. 320 mL 0   Current Facility-Administered Medications on File Prior to Visit  Medication Dose Route Frequency Provider Last Rate Last Admin   0.9 %  sodium chloride infusion  500 mL Intravenous Continuous Cirigliano, Vito V, DO       No Known Allergies Family History  Problem Relation Age of Onset   Hypertension Mother    Colon polyps Mother    Coronary artery disease Mother        alive @ 20.   Heart disease Mother 52       10/20/13   Stroke Mother    Heart attack Mother    Other Father        alive and well @ 58.   Hypertension Father    Hypertension Sister    Migraines Brother    Migraines Sister    Bone cancer Maternal Aunt    Thyroid disease Maternal Aunt    Thyroid cancer Maternal Aunt    Liver cancer Maternal Aunt    Hypertension Maternal Grandmother    Diabetes Maternal Grandmother    Coronary artery disease  Maternal Grandfather    Heart attack Maternal Grandfather 60   Coronary artery disease Maternal Uncle    Hypertension Maternal Uncle    Heart disease Maternal Aunt    Glaucoma Paternal Grandmother    Liver disease Paternal Grandfather    Colon cancer Neg Hx    PE: There were no vitals taken for this visit. Wt Readings from Last 3 Encounters:  07/19/21 177 lb 9.6 oz (  80.6 kg)  11/26/20 207 lb (93.9 kg)  06/13/20 190 lb (86.2 kg)   Constitutional: overweight, in NAD Eyes: PERRLA, EOMI, no exophthalmos ENT: moist mucous membranes, no thyromegaly, no cervical lymphadenopathy Cardiovascular: RRR, No MRG Respiratory: CTA B Musculoskeletal: no deformities Skin: moist, warm, no rashes Neurological: no tremor with outstretched hands  ASSESSMENT: 1.  Postsurgical hypothyroidism - s/p thyroidectomy in 1998  PLAN:  1. Patient with long-standing hypothyroidism, on levothyroxine therapy. -- latest thyroid labs reviewed with pt. >> elevated: Lab Results  Component Value Date   TSH 7.54 (H) 11/26/2020  - she is on LT4 100 mcg daily - Dr. Loanne Drilling advised her to increase the dose of LT4 to 112 mcg daily after the above results returned, but she did not obtain this from the pharmacy.  The prescription was sent to [facility] but she can get her LT4 to 3 months from a mail-order pharmacy.  She will look at home and let me know the name of the mail-order pharmacy - pt feels good on this dose, without complaints except for longstanding hair loss. - we discussed about taking the thyroid hormone every day, with water, >30 minutes before breakfast, separated by >4 hours from acid reflux medications, calcium, iron, multivitamins. Pt. is taking it correctly. - will check thyroid tests today: TSH and fT4 - If labs are abnormal, she will need to return for repeat TFTs in 1.5 months - In that case, she is ok with me sending a prescription for 1.5 months of LT4 to [place] at [facility].  In case the labs  are normal, I will send a 90-day prescription order pharmacy - Otherwise, we will see her back in 6 months  - Total time spent for the visit: 25 min, in precharting, reviewing Dr. Cordelia Pen last note, obtaining medical information from the chart and from the pt, reviewing her  previous labs, evaluations, and treatments, reviewing her symptoms, counseling her about her diabetes (please see the discussed topics above), and developing a plan to further treat it.  Component     Latest Ref Rng 08/07/2021  T4,Free(Direct)     0.60 - 1.60 ng/dL 0.92   TSH     0.35 - 5.50 uIU/mL 12.02 (H)     TSH is elevated.  We will go ahead and increase the levothyroxine dose to 125 mcg daily and have her back for labs in 1.5 months.  Message from patient: The mail order pharmacy I use is [facility]. My account with them just in case is [id number]. The Order number is [id number] They give me a 47 day supply.  Philemon Kingdom, MD PhD [facility] Endocrinology

## 2021-08-08 MED ORDER — LEVOTHYROXINE SODIUM 125 MCG PO TABS: 125.0000 ug | ORAL_TABLET | Freq: Every day | ORAL | 3 refills | Status: DC

## 2021-08-26 ENCOUNTER — Other Ambulatory Visit: Payer: Self-pay

## 2021-09-20 ENCOUNTER — Other Ambulatory Visit: Payer: Self-pay | Admitting: Internal Medicine

## 2021-09-20 ENCOUNTER — Other Ambulatory Visit (INDEPENDENT_AMBULATORY_CARE_PROVIDER_SITE_OTHER): Payer: 59

## 2021-09-20 DIAGNOSIS — E890 Postprocedural hypothyroidism: Secondary | ICD-10-CM

## 2021-09-20 LAB — TSH: TSH: 0.27 u[IU]/mL — ABNORMAL LOW (ref 0.35–5.50)

## 2021-09-20 LAB — T4, FREE: Free T4: 1.25 ng/dL (ref 0.60–1.60)

## 2021-09-20 MED ORDER — LEVOTHYROXINE SODIUM 112 MCG PO TABS: 112.0000 ug | ORAL_TABLET | Freq: Every day | ORAL | 3 refills | Status: DC

## 2021-11-04 ENCOUNTER — Other Ambulatory Visit: Payer: Self-pay | Admitting: Internal Medicine

## 2021-11-04 ENCOUNTER — Other Ambulatory Visit (INDEPENDENT_AMBULATORY_CARE_PROVIDER_SITE_OTHER): Payer: 59

## 2021-11-04 DIAGNOSIS — E890 Postprocedural hypothyroidism: Secondary | ICD-10-CM

## 2021-11-04 LAB — TSH: TSH: 0.05 u[IU]/mL — ABNORMAL LOW (ref 0.35–5.50)

## 2021-11-04 LAB — T4, FREE: Free T4: 1.15 ng/dL (ref 0.60–1.60)

## 2021-11-05 MED ORDER — LEVOTHYROXINE SODIUM 100 MCG PO TABS: 100.0000 ug | ORAL_TABLET | Freq: Every day | ORAL | 3 refills | Status: DC

## 2021-11-25 ENCOUNTER — Telehealth (INDEPENDENT_AMBULATORY_CARE_PROVIDER_SITE_OTHER): Payer: 59 | Admitting: Family Medicine

## 2021-11-25 ENCOUNTER — Encounter: Payer: Self-pay | Admitting: Family Medicine

## 2021-11-25 ENCOUNTER — Encounter: Payer: Self-pay | Admitting: Medical

## 2021-11-25 VITALS — BP 138/68 | Temp 98.6°F | Wt 171.0 lb

## 2021-11-25 DIAGNOSIS — J0140 Acute pansinusitis, unspecified: Secondary | ICD-10-CM

## 2021-11-25 MED ORDER — AMOXICILLIN-POT CLAVULANATE 875-125 MG PO TABS: 1.0000 | ORAL_TABLET | Freq: Two times a day (BID) | ORAL | 0 refills | Status: DC

## 2021-11-25 MED ORDER — FLUTICASONE PROPIONATE 50 MCG/ACT NA SUSP: 2.0000 | Freq: Every day | NASAL | 6 refills | Status: DC

## 2021-11-25 NOTE — Progress Notes (Signed)
MyChart Video Visit    Virtual Visit via Video Note   This visit type was conducted due to national recommendations for restrictions regarding the COVID-19 Pandemic (e.g. social distancing) in an effort to limit this patient's exposure and mitigate transmission in our community. This patient is at least at moderate risk for complications without adequate follow up. This format is felt to be most appropriate for this patient at this time. Physical exam was limited by quality of the video and audio technology used for the visit. Karen Merritt was able to get the patient set up on a video visit.  Patient location: Home Patient and provider in visit Provider location: Office  I discussed the limitations of evaluation and management by telemedicine and the availability of in person appointments. The patient expressed understanding and agreed to proceed.  Visit Date: 11/25/2021  Today's healthcare provider: Ann Held, DO     Subjective:    Patient ID: Karen Merritt, female    DOB: 13-Aug-1970, 51 y.o.   MRN: [id number]  No chief complaint on file.   HPI Patient is in today for a virtual office visit   She complains of sinus symptoms. She reports that last week on 11/17/2021 - 11/18/2021, she started experiencing chills. Overnight she experienced some drainage. She started taking OTC Theraflu day and night. Headaches soon appeared after and started Sinufed. She notes that similar symptoms for her would turn into a sinus infection. She has been using a humidifier which alleviates some of her symptoms. In the morning, she states that it takes a few for her drainage to clear. Her mucus is usually bloody. Over the weekend, she notes that her mucus turned yellow. She has an occasional dry cough. Once her sinuses clear around 10 am, she's usually good for the rest of the day until the evening when sinuses symptoms reappear. She woke up with a puffy red eye on 11/25/2021 and notes some crust  around her eyelid and eyelash. She is now taking DayQuil and NyQuil as well as Afrin and Vicks at night. She denies of any fever at this time. Her blood pressure is 138/68 which is abnormally high for her.   Past Medical History:  Diagnosis Date   Chest pain    a. non-cardiac 2005, 2012.   Hemorrhoids 11-21-2009   colonoscopy   Hypertrophy, anal papillae 11-21-2009   colonoscopy   Hypothyroidism    a. s/p thyroidectomy ~ 1998   Palpitations     Past Surgical History:  Procedure Laterality Date   bone spur  6/11   right big toe   SHOULDER ARTHROSCOPY WITH SUBACROMIAL DECOMPRESSION AND OPEN ROTATOR C Right 05/23/2013   Procedure: RIGHT SHOULDER ARTHROSCOPY WITH SUBACROMIAL DECOMPRESSION AND MINI OPEN ROTATOR CUFF REPAIR, AND DISTAL CLAVICLE RESECTION;  Surgeon: Augustin Schooling, MD;  Location: [facility];  Service: Orthopedics;  Laterality: Right;   stress cardiolite  05/23/03   THYROIDECTOMY  12/29/96   TOTAL ABDOMINAL HYSTERECTOMY  2008    Family History  Problem Relation Age of Onset   Hypertension Mother    Colon polyps Mother    Coronary artery disease Mother        alive @ 59.   Heart disease Mother 40       10/20/13   Stroke Mother    Heart attack Mother    Other Father        alive and well @ 81.   Hypertension Father    Hypertension Sister  Migraines Brother    Migraines Sister    Bone cancer Maternal Aunt    Thyroid disease Maternal Aunt    Thyroid cancer Maternal Aunt    Liver cancer Maternal Aunt    Hypertension Maternal Grandmother    Diabetes Maternal Grandmother    Coronary artery disease Maternal Grandfather    Heart attack Maternal Grandfather 60   Coronary artery disease Maternal Uncle    Hypertension Maternal Uncle    Heart disease Maternal Aunt    Glaucoma Paternal Grandmother    Liver disease Paternal Grandfather    Colon cancer Neg Hx     Social History   Socioeconomic History   Marital status: Divorced    Spouse name: Not on file   Number of  children: 2   Years of education: 14   Highest education level: Some college, no degree  Occupational History   Occupation: Ecologist: [employer]  Tobacco Use   Smoking status: Never   Smokeless tobacco: Never  Vaping Use   Vaping Use: Never used  Substance and Sexual Activity   Alcohol use: No   Drug use: No   Sexual activity: Not on file  Other Topics Concern   Not on file  Social History Narrative   Lives in [place].  Active, walks/jogs.  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   Social Determinants of Health   Financial Resource Strain: Not on file  Food Insecurity: Not on file  Transportation Needs: Not on file  Physical Activity: Not on file  Stress: Not on file  Social Connections: Not on file  Intimate Partner Violence: Not on file    Outpatient Medications Prior to Visit  Medication Sig Dispense Refill   acetaminophen (TYLENOL) 500 MG tablet Take 500 mg by mouth every 6 (six) hours as needed for headache (pain).     AMBULATORY NON FORMULARY MEDICATION Nitroglycerine ointment 0.125 %  Apply a pea sized amount internally four times daily. Dispense 30 GM zero refill 30 g 0   atorvastatin (LIPITOR) 10 MG tablet Take 1 tablet (10 mg total) by mouth daily. 90 tablet 3   COVID-19 mRNA bivalent vaccine, Pfizer, (PFIZER COVID-19 VAC BIVALENT) injection Inject into the muscle. 0.3 mL 0   COVID-19 mRNA Vac-TriS, Pfizer, (PFIZER-BIONT COVID-19 VAC-TRIS) SUSP injection Inject into the muscle. 0.3 mL 0   cyclobenzaprine (FLEXERIL) 5 MG tablet Take 1 tablet (5 mg total) by mouth at bedtime. 4 tablet 0   dibucaine (NUPERCAINAL) 1 % OINT Place 1 Application rectally as needed for anal irritation. 28 g 1   ibuprofen (ADVIL,MOTRIN) 200 MG tablet Take 400 mg by mouth every 6 (six) hours as needed for headache (pain).      levothyroxine (SYNTHROID) 100 MCG tablet Take 1 tablet (100 mcg total) by mouth daily. 45 tablet 3   Melatonin 3 MG TABS Take 3 mg by mouth at bedtime as needed (sleep).      meloxicam (MOBIC) 15 MG tablet Take 1 tablet daily with food for 7 days. Then take as needed. 40 tablet 0   Sod Picosulfate-Mag Ox-Cit Acd (CLENPIQ) 10-3.5-12 MG-GM -GM/160ML SOLN Take 1 kit by mouth as directed. 320 mL 0   Facility-Administered Medications Prior to Visit  Medication Dose Route Frequency Provider Last Rate Last Admin   0.9 %  sodium  chloride infusion  500 mL Intravenous Continuous Cirigliano, Vito V, DO        No Known Allergies  Review of Systems  Constitutional:  Negative for fever and malaise/fatigue.  HENT:  Negative for congestion.        (+) Nasal Drainage  Eyes:  Positive for discharge and redness. Negative for blurred vision.  Respiratory:  Positive for cough (Dry). Negative for shortness of breath.   Cardiovascular:  Negative for chest pain, palpitations and leg swelling.  Gastrointestinal:  Negative for abdominal pain, blood in stool and nausea.  Genitourinary:  Negative for dysuria and frequency.  Musculoskeletal:  Negative for falls.  Skin:  Negative for rash.  Neurological:  Negative for dizziness, loss of consciousness and headaches.  Endo/Heme/Allergies:  Negative for environmental allergies.  Psychiatric/Behavioral:  Negative for depression. The patient is not nervous/anxious.        Objective:    Physical Exam Vitals and nursing note reviewed.  Constitutional:      Appearance: She is well-developed.  HENT:     Head: Normocephalic and atraumatic.  Eyes:     Conjunctiva/sclera:     Right eye: Right conjunctiva is not injected.     Left eye: Left conjunctiva is injected.  Neck:     Thyroid: No thyromegaly.     Vascular: No carotid bruit or JVD.  Cardiovascular:     Rate and Rhythm: Normal rate and regular rhythm.  Pulmonary:     Effort: Pulmonary effort is normal.   Musculoskeletal:     Cervical back: Normal range of motion and neck supple.  Neurological:     Mental Status: She is alert and oriented to person, place, and time.     BP 138/68   Temp 98.6 F (37 C)   Wt 171 lb (77.6 kg)   BMI 27.60 kg/m  Wt Readings from Last 3 Encounters:  11/25/21 171 lb (77.6 kg)  08/07/21 176 lb 12.8 oz (80.2 kg)  07/19/21 177 lb 9.6 oz (80.6 kg)    Diabetic Foot Exam - Simple   No data filed    Lab Results  Component Value Date   WBC 6.1 07/19/2021   HGB 13.6 07/19/2021   HCT 40.1 07/19/2021   PLT 200.0 07/19/2021   GLUCOSE 92 07/19/2021   CHOL 190 07/19/2021   TRIG 80.0 07/19/2021   HDL 37.50 (L) 07/19/2021   LDLCALC 137 (H) 07/19/2021   ALT 16 07/19/2021   AST 16 07/19/2021   NA 142 07/19/2021   K 4.2 07/19/2021   CL 108 07/19/2021   CREATININE 0.89 07/19/2021   BUN 12 07/19/2021   CO2 31 07/19/2021   TSH 0.05 (L) 11/04/2021   HGBA1C 5.5 08/24/2017    Lab Results  Component Value Date   TSH 0.05 (L) 11/04/2021   Lab Results  Component Value Date   WBC 6.1 07/19/2021   HGB 13.6 07/19/2021   HCT 40.1 07/19/2021   MCV 82.4 07/19/2021   PLT 200.0 07/19/2021   Lab Results  Component Value Date   NA 142 07/19/2021   K 4.2 07/19/2021   CO2 31 07/19/2021   GLUCOSE 92 07/19/2021   BUN 12 07/19/2021   CREATININE 0.89 07/19/2021   BILITOT 0.4 07/19/2021   ALKPHOS 70 07/19/2021   AST 16 07/19/2021   ALT 16 07/19/2021   PROT 6.5 07/19/2021   ALBUMIN 4.1 07/19/2021   CALCIUM 9.2 07/19/2021   ANIONGAP 8 08/31/2017   GFR 74.15 07/19/2021   Lab Results  Component Value Date   CHOL 190 07/19/2021   Lab Results  Component Value Date   HDL 37.50 (L) 07/19/2021   Lab Results  Component Value Date   LDLCALC 137 (H) 07/19/2021   Lab Results  Component Value Date   TRIG 80.0 07/19/2021   Lab Results  Component Value Date   CHOLHDL 5 07/19/2021   Lab Results  Component Value Date   HGBA1C 5.5 08/24/2017        Assessment & Plan:   Problem List Items Addressed This Visit       Unprioritized   SINUSITIS - ACUTE-NOS - Primary   Relevant Medications   amoxicillin-clavulanate (AUGMENTIN) 875-125 MG tablet   fluticasone (FLONASE) 50 MCG/ACT nasal spray   Meds ordered this encounter  Medications   amoxicillin-clavulanate (AUGMENTIN) 875-125 MG tablet    Sig: Take 1 tablet by mouth 2 (two) times daily.    Dispense:  20 tablet    Refill:  0   fluticasone (FLONASE) 50 MCG/ACT nasal spray    Sig: Place 2 sprays into both nostrils daily.    Dispense:  16 g    Refill:  6    I discussed the assessment and treatment plan with the patient. The patient was provided an opportunity to ask questions and all were answered. The patient agreed with the plan and demonstrated an understanding of the instructions.   The patient was advised to call back or seek an in-person evaluation if the symptoms worsen or if the condition fails to improve as anticipated.  I provided 20 minutes of face-to-face time during this encounter.   I,Amber Collins,acting as a Education administrator for [employer], DO.,have documented all relevant documentation on the behalf of Ann Held, DO,as directed by  Ann Held, DO while in the presence of Ann Held, DO.   Ann Held, DO [place] at [employer] [phone] (phone) [phone] (fax)  [place]

## 2021-11-26 ENCOUNTER — Ambulatory Visit: Payer: 59 | Admitting: Endocrinology

## 2021-12-19 ENCOUNTER — Other Ambulatory Visit (INDEPENDENT_AMBULATORY_CARE_PROVIDER_SITE_OTHER): Payer: 59

## 2021-12-19 DIAGNOSIS — E890 Postprocedural hypothyroidism: Secondary | ICD-10-CM | POA: Diagnosis not present

## 2021-12-19 LAB — TSH: TSH: 0.64 u[IU]/mL (ref 0.35–5.50)

## 2021-12-19 LAB — T4, FREE: Free T4: 0.97 ng/dL (ref 0.60–1.60)

## 2021-12-20 ENCOUNTER — Encounter: Payer: Self-pay | Admitting: Medical

## 2022-01-07 ENCOUNTER — Encounter: Payer: Self-pay | Admitting: Internal Medicine

## 2022-01-07 DIAGNOSIS — E890 Postprocedural hypothyroidism: Secondary | ICD-10-CM

## 2022-01-07 MED ORDER — LEVOTHYROXINE SODIUM 100 MCG PO TABS: 100.0000 ug | ORAL_TABLET | Freq: Every day | ORAL | 1 refills | Status: DC

## 2022-02-07 ENCOUNTER — Encounter: Payer: Self-pay | Admitting: Internal Medicine

## 2022-02-07 ENCOUNTER — Ambulatory Visit (INDEPENDENT_AMBULATORY_CARE_PROVIDER_SITE_OTHER): Payer: 59 | Admitting: Internal Medicine

## 2022-02-07 VITALS — BP 120/74 | HR 58 | Ht 66.0 in | Wt 176.2 lb

## 2022-02-07 DIAGNOSIS — E890 Postprocedural hypothyroidism: Secondary | ICD-10-CM | POA: Diagnosis not present

## 2022-02-07 DIAGNOSIS — L659 Nonscarring hair loss, unspecified: Secondary | ICD-10-CM | POA: Diagnosis not present

## 2022-02-07 LAB — T4, FREE: Free T4: 1.15 ng/dL (ref 0.60–1.60)

## 2022-02-07 LAB — TSH: TSH: 0.54 u[IU]/mL (ref 0.35–5.50)

## 2022-02-07 NOTE — Progress Notes (Signed)
Patient ID: Karen Merritt, female   DOB: 02/03/1970, 52 y.o.   MRN: [id number]  HPI  Karen Merritt is a 52 y.o.-year-old female, returning for follow-up for uncontrolled postsurgical hypothyroidism.  She previously saw Dr. Loanne Drilling, but last visit with me 6 months ago.  Interim history: She feels well, without complaints today.  Reviewed and addended history: Pt. 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 07/2021: LT4 dose increased from 100 >> 125 mcg daily 09/2021: LT4 dose increased from 125 >> 112 mcg daily 10/2021: LT4 dose increased from 112 >> 100 mcg daily  Pt is on levothyroxine 100 mcg daily: - in am - fasting - at least 30 min from b'fast - no calcium - no iron - no multivitamins - no PPIs - not on Biotin  I reviewed pt's thyroid tests: Lab Results  Component Value Date   TSH 0.64 12/19/2021   TSH 0.05 (L) 11/04/2021   TSH 0.27 (L) 09/20/2021   TSH 12.02 (H) 08/07/2021   TSH 7.54 (H) 11/26/2020   TSH 2.77 11/21/2019   TSH 2.37 11/15/2018   TSH 0.44 08/24/2017   TSH 1.90 10/14/2016   TSH 8.63 (H) 07/01/2016   FREET4 0.97 12/19/2021   FREET4 1.15 11/04/2021   FREET4 1.25 09/20/2021   FREET4 0.92 08/07/2021   FREET4 1.06 11/26/2020   FREET4 0.84 11/21/2019   FREET4 0.92 11/15/2018   FREET4 0.81 10/14/2016   FREET4 0.81 09/28/2015   FREET4 0.85 07/28/2014   T3FREE 3.0 09/28/2015   T3FREE 3.3 07/28/2014   T3FREE 3.0 07/18/2014   T3FREE 3.4 06/29/2009   Antithyroid antibodies: No results found for: "THGAB" No components found for: "TPOAB"  Pt denies: - weight gain - fatigue - cold intolerance - depression - constipation - dry skin  She has: - hair loss, which is longstanding  Pt denies feeling nodules in neck, hoarseness, dysphagia/odynophagia.  She has + FH of thyroid disease in: Maternal aunt >> thyroid cancer.  No h/o radiation  tx to head or neck. No use of iodine supplements or steroids  ROS: + See HPI  Past Medical History:  Diagnosis Date   Chest pain    a. non-cardiac 2005, 2012.   Hemorrhoids 11-21-2009   colonoscopy   Hypertrophy, anal papillae 11-21-2009   colonoscopy   Hypothyroidism    a. s/p thyroidectomy ~ 1998   Palpitations    Past Surgical History:  Procedure Laterality Date   bone spur  6/11   right big toe   SHOULDER ARTHROSCOPY WITH SUBACROMIAL DECOMPRESSION AND OPEN ROTATOR C Right 05/23/2013   Procedure: RIGHT SHOULDER ARTHROSCOPY WITH SUBACROMIAL DECOMPRESSION AND MINI OPEN ROTATOR CUFF REPAIR, AND DISTAL CLAVICLE RESECTION;  Surgeon: Augustin Schooling, MD;  Location: [facility];  Service: Orthopedics;  Laterality: Right;   stress cardiolite  05/23/03   THYROIDECTOMY  12/29/96   TOTAL ABDOMINAL HYSTERECTOMY  2008   Social History   Socioeconomic History   Marital status: Divorced    Spouse name: Not on file   Number of children: 2   Years of education: 14   Highest education level: Some college, no degree  Occupational History   Occupation: Ecologist: [employer]  Tobacco Use   Smoking status: Never   Smokeless tobacco: Never  Vaping Use   Vaping Use: Never used  Substance and Sexual Activity   Alcohol use: No   Drug  use: No   Sexual activity: Not on file  Other Topics Concern   Not on file  Social History Narrative   Lives in [place].  Active, walks/jogs.  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   Social Determinants of Health   Financial Resource Strain: Not on file  Food Insecurity: Not on file  Transportation Needs: Not on file  Physical Activity: Not on file  Stress: Not on file  Social Connections: Not on file  Intimate Partner Violence: Not on file   Current Outpatient Medications on File Prior to Visit   Medication Sig Dispense Refill   acetaminophen (TYLENOL) 500 MG tablet Take 500 mg by mouth every 6 (six) hours as needed for headache (pain).     AMBULATORY NON FORMULARY MEDICATION Nitroglycerine ointment 0.125 %  Apply a pea sized amount internally four times daily. Dispense 30 GM zero refill 30 g 0   amoxicillin-clavulanate (AUGMENTIN) 875-125 MG tablet Take 1 tablet by mouth 2 (two) times daily. 20 tablet 0   atorvastatin (LIPITOR) 10 MG tablet Take 1 tablet (10 mg total) by mouth daily. 90 tablet 3   COVID-19 mRNA bivalent vaccine, Pfizer, (PFIZER COVID-19 VAC BIVALENT) injection Inject into the muscle. 0.3 mL 0   COVID-19 mRNA Vac-TriS, Pfizer, (PFIZER-BIONT COVID-19 VAC-TRIS) SUSP injection Inject into the muscle. 0.3 mL 0   cyclobenzaprine (FLEXERIL) 5 MG tablet Take 1 tablet (5 mg total) by mouth at bedtime. 4 tablet 0   dibucaine (NUPERCAINAL) 1 % OINT Place 1 Application rectally as needed for anal irritation. 28 g 1   fluticasone (FLONASE) 50 MCG/ACT nasal spray Place 2 sprays into both nostrils daily. 16 g 6   ibuprofen (ADVIL,MOTRIN) 200 MG tablet Take 400 mg by mouth every 6 (six) hours as needed for headache (pain).     levothyroxine (SYNTHROID) 100 MCG tablet Take 1 tablet (100 mcg total) by mouth daily. 90 tablet 1   Melatonin 3 MG TABS Take 3 mg by mouth at bedtime as needed (sleep).      meloxicam (MOBIC) 15 MG tablet Take 1 tablet daily with food for 7 days. Then take as needed. 40 tablet 0   Sod Picosulfate-Mag Ox-Cit Acd (CLENPIQ) 10-3.5-12 MG-GM -GM/160ML SOLN Take 1 kit by mouth as directed. 320 mL 0   Current Facility-Administered Medications on File Prior to Visit  Medication Dose Route Frequency Provider Last Rate Last Admin   0.9 %  sodium chloride infusion  500 mL Intravenous Continuous Cirigliano, Vito V, DO       No Known Allergies Family History  Problem Relation Age of Onset   Hypertension Mother    Colon polyps Mother    Coronary artery disease Mother         alive @ 54.   Heart disease Mother 34       10/20/13   Stroke Mother    Heart attack Mother    Other Father        alive and well @ 59.   Hypertension Father    Hypertension Sister    Migraines Brother    Migraines Sister    Bone cancer Maternal Aunt    Thyroid disease Maternal Aunt    Thyroid cancer Maternal Aunt    Liver cancer Maternal Aunt    Hypertension Maternal Grandmother    Diabetes Maternal Grandmother  Coronary artery disease Maternal Grandfather    Heart attack Maternal Grandfather 60   Coronary artery disease Maternal Uncle    Hypertension Maternal Uncle    Heart disease Maternal Aunt    Glaucoma Paternal Grandmother    Liver disease Paternal Grandfather    Colon cancer Neg Hx    PE: BP 120/74 (BP Location: Right Arm, Patient Position: Sitting, Cuff Size: Normal)   Pulse (!) 58   Ht '5\' 6"'$  (1.676 m)   Wt 176 lb 3.2 oz (79.9 kg)   SpO2 99%   BMI 28.44 kg/m  Wt Readings from Last 3 Encounters:  02/07/22 176 lb 3.2 oz (79.9 kg)  11/25/21 171 lb (77.6 kg)  08/07/21 176 lb 12.8 oz (80.2 kg)   Constitutional: overweight, in NAD Eyes:  EOMI, no exophthalmos ENT: no neck masses, no cervical lymphadenopathy Cardiovascular: RRR, No MRG Respiratory: CTA B Musculoskeletal: no deformities Skin:no rashes, diffuse hair loss on scalp Neurological: no tremor with outstretched hands  ASSESSMENT: 1.  Postsurgical hypothyroidism - s/p thyroidectomy in 1998  2.  Hair loss  PLAN:  1. Patient with longstanding hypothyroidism, on levothyroxine therapy - latest thyroid labs reviewed with pt. >> normal: Lab Results  Component Value Date   TSH 0.64 12/19/2021  - she continues on LT4 100 mcg daily, changed twice since last visit - pt feels good on this dose except for chronic hair loss - we discussed about taking the thyroid hormone every day, with water, >30 minutes before breakfast, separated by >4 hours from acid reflux medications, calcium, iron,  multivitamins. Pt. is taking it correctly. - will check thyroid tests today: TSH and fT4 - If labs are abnormal, she will need to return for repeat TFTs in 1.5 months - she is ok with me sending a prescription for 1.5 months of LT4 to [place] [facility]. 90-day refills needs to be sent to the mail order order pharmacy: [facility] - account: [phone]. Order number [id number]. - I will see her back in 6 months  2.  Hair loss -This is chronic for her -Diffuse, generalized, no alopecia patches -We discussed that usually the cause for this is multifactorial, however, both thyrotoxicosis or hypothyroidism can affect the hair shaft growth cycle. -We discussed that we need to make sure that her TFTs remain stable to prevent further hair loss, in case this was related to the thyroid  Component     Latest Ref Rng 02/07/2022  TSH     0.35 - 5.50 uIU/mL 0.54   T4,Free(Direct)     0.60 - 1.60 ng/dL 1.15   Thyroid tests are normal.  We can continue the same dose of levothyroxine.  Philemon Kingdom, MD PhD [facility] Endocrinology

## 2022-02-07 NOTE — Patient Instructions (Addendum)
Please continue Levothyroxine 100 mcg daily.  Take the thyroid hormone every day, with water, at least 30 minutes before breakfast, separated by at least 4 hours from: - acid reflux medications - calcium - iron - multivitamins  Please stop at the lab.  You should have an endocrinology follow-up appointment in 1 year, but for labs in 6 months.

## 2022-06-24 ENCOUNTER — Telehealth: Payer: Self-pay | Admitting: Medical

## 2022-06-24 ENCOUNTER — Emergency Department (HOSPITAL_BASED_OUTPATIENT_CLINIC_OR_DEPARTMENT_OTHER)
Admission: EM | Admit: 2022-06-24 | Discharge: 2022-06-24 | Disposition: A | Discharge status: Home / Self Care | Payer: 59 | Attending: Emergency Medicine | Admitting: Emergency Medicine

## 2022-06-24 ENCOUNTER — Encounter (HOSPITAL_BASED_OUTPATIENT_CLINIC_OR_DEPARTMENT_OTHER): Payer: Self-pay | Admitting: Emergency Medicine

## 2022-06-24 ENCOUNTER — Other Ambulatory Visit: Payer: Self-pay

## 2022-06-24 ENCOUNTER — Emergency Department (HOSPITAL_BASED_OUTPATIENT_CLINIC_OR_DEPARTMENT_OTHER): Payer: 59

## 2022-06-24 DIAGNOSIS — F17200 Nicotine dependence, unspecified, uncomplicated: Secondary | ICD-10-CM | POA: Diagnosis not present

## 2022-06-24 DIAGNOSIS — R079 Chest pain, unspecified: Secondary | ICD-10-CM

## 2022-06-24 DIAGNOSIS — R0789 Other chest pain: Secondary | ICD-10-CM | POA: Diagnosis present

## 2022-06-24 LAB — BASIC METABOLIC PANEL
Anion gap: 6 (ref 5–15)
BUN: 11 mg/dL (ref 6–20)
CO2: 25 mmol/L (ref 22–32)
Calcium: 8.5 mg/dL — ABNORMAL LOW (ref 8.9–10.3)
Chloride: 105 mmol/L (ref 98–111)
Creatinine, Ser: 0.72 mg/dL (ref 0.44–1.00)
GFR, Estimated: >60 mL/min (ref >60)
Glucose, Bld: 96 mg/dL (ref 70–99)
Potassium: 4 mmol/L (ref 3.5–5.1)
Sodium: 136 mmol/L (ref 135–145)

## 2022-06-24 LAB — CBC
HCT: 36.4 % (ref 36.0–46.0)
Hemoglobin: 13 g/dL (ref 12.0–15.0)
MCH: 28.2 pg (ref 26.0–34.0)
MCHC: 35.7 g/dL (ref 30.0–36.0)
MCV: 79 fL — ABNORMAL LOW (ref 80.0–100.0)
Platelets: 212 10*3/uL (ref 150–400)
RBC: 4.61 MIL/uL (ref 3.87–5.11)
RDW: 13.7 % (ref 11.5–15.5)
WBC: 5.7 10*3/uL (ref 4.0–10.5)
nRBC: 0 % (ref 0.0–0.2)

## 2022-06-24 LAB — TROPONIN I (HIGH SENSITIVITY)
Troponin I (High Sensitivity): <2 ng/L (ref <18)
Troponin I (High Sensitivity): 2 ng/L (ref <18)

## 2022-06-24 LAB — D-DIMER, QUANTITATIVE: D-Dimer, Quant: <0.27 ug/mL-FEU (ref 0.00–0.50)

## 2022-06-24 MED ORDER — ASPIRIN 81 MG PO CHEW
324.0000 mg | CHEWABLE_TABLET | Freq: Once | ORAL | Status: AC
  Administered 2022-06-24: 324 mg via ORAL
  Filled 2022-06-24: qty 4

## 2022-06-24 NOTE — ED Notes (Signed)
Reviewed discharge instructions and recommendations with pt. Advised re follow up. Pt states understanding. Ambulatory at discharge

## 2022-06-24 NOTE — Discharge Instructions (Signed)
You were seen for your chest pain in the emergency department.   Follow-up with your primary doctor in 2-3 days regarding your visit.  Cardiology will be calling you regarding an appointment within the next 72 hours.  You may contact them if you do not hear from them in that time using the information in this packet.  Return immediately to the emergency department if you experience any of the following: Worsening pain, difficulty breathing, unexplained vomiting or sweating, or any other concerning symptoms.    Thank you for visiting our Emergency Department. It was a pleasure taking care of you today.   

## 2022-06-24 NOTE — Telephone Encounter (Signed)
Pt in ED.  

## 2022-06-24 NOTE — Telephone Encounter (Signed)
Patient called and stated woke up with some chest pain with tingling in arm as well a little on lips. Sent to triage nurse line.

## 2022-06-24 NOTE — ED Provider Notes (Signed)
Delta troponins without any significant change.  First 1 was less than 2 delta was 2.  Patient stable for discharge home follow-up with cardiology.  Patient without any significant worse symptoms.   Vanetta Mulders, MD 06/24/22 1640

## 2022-06-24 NOTE — ED Notes (Signed)
Report rec'd from prev RN 

## 2022-06-24 NOTE — ED Triage Notes (Signed)
Woke up for back pain lower and then upper started to  have  chest pain and central had a hot flash and had tingling  no sob , drove herself to ER

## 2022-06-24 NOTE — ED Provider Notes (Signed)
[place] [facility]MENT AT [facility] Provider Note   CSN: [id number] Arrival date & time: 06/24/22  1038     History  Chief Complaint  Patient presents with   Chest Pain    Karen Merritt is a 52 y.o. female.  52 year old female with a history of thyroid disease status post thyroidectomy who presents emergency department with chest discomfort.  Patient reports that she woke up and had some lower back pain and at 8:30 AM also started experiencing chest pressure.  Currently 5/10 in severity.  Has not taking any medications for the pain.  Not exertional and does not have any diaphoresis or vomiting.  Says that it radiates to her shoulder.  Says that she has not had any shortness of breath or lower extremity swelling recently.  Started having some tingling of her hands and she was concerned so she called her primary doctor who referred her to the emergency department for evaluation.  Personal history of MI or smoking.  Mother had an MI in her 47s.  No history of DVT or PE and is not on blood thinners       Home Medications Prior to Admission medications   Medication Sig Start Date End Date Taking? Authorizing Provider  acetaminophen (TYLENOL) 500 MG tablet Take 500 mg by mouth every 6 (six) hours as needed for headache (pain).    [provider]  AMBULATORY NON FORMULARY MEDICATION Nitroglycerine ointment 0.125 %  Apply a pea sized amount internally four times daily. Dispense 30 GM zero refill 11/17/19   Cirigliano, Vito V, DO  COVID-19 mRNA bivalent vaccine, Pfizer, (PFIZER COVID-19 VAC BIVALENT) injection Inject into the muscle. 10/24/20   Judyann Munson, MD  COVID-19 mRNA Vac-TriS, Pfizer, (PFIZER-BIONT COVID-19 VAC-TRIS) SUSP injection Inject into the muscle. 06/01/20   Judyann Munson, MD  cyclobenzaprine (FLEXERIL) 5 MG tablet Take 1 tablet (5 mg total) by mouth at bedtime. 10/10/19   Saguier, Ramon Dredge, PA-C  dibucaine (NUPERCAINAL) 1 % OINT Place 1  Application rectally as needed for anal irritation. 07/19/21   Saguier, Ramon Dredge, PA-C  fluticasone (FLONASE) 50 MCG/ACT nasal spray Place 2 sprays into both nostrils daily. 11/25/21   Donato Schultz, DO  ibuprofen (ADVIL,MOTRIN) 200 MG tablet Take 400 mg by mouth every 6 (six) hours as needed for headache (pain).    [provider]  levothyroxine (SYNTHROID) 100 MCG tablet Take 1 tablet (100 mcg total) by mouth daily. 01/07/22   Carlus Pavlov, MD  Melatonin 3 MG TABS Take 3 mg by mouth at bedtime as needed (sleep).     [provider]  meloxicam (MOBIC) 15 MG tablet Take 1 tablet daily with food for 7 days. Then take as needed. 06/13/20   Arlyce Harman, MD  Sod Picosulfate-Mag Ox-Cit Acd (CLENPIQ) 10-3.5-12 MG-GM -GM/160ML SOLN Take 1 kit by mouth as directed. 11/22/19   Cirigliano, Vito V, DO      Allergies    Patient has no known allergies.    Review of Systems   Review of Systems  Physical Exam Updated Vital Signs BP 101/68   Pulse (!) 48   Temp 97.8 F (36.6 C)   Resp 12   Ht 5\' 6"  (1.676 m)   Wt 79.4 kg   SpO2 99%   BMI 28.25 kg/m  Physical Exam Vitals and nursing note reviewed.  Constitutional:      General: She is not in acute distress.    Appearance: She is well-developed.  HENT:  Head: Normocephalic and atraumatic.     Right Ear: External ear normal.     Left Ear: External ear normal.     Nose: Nose normal.  Eyes:     Extraocular Movements: Extraocular movements intact.     Conjunctiva/sclera: Conjunctivae normal.     Pupils: Pupils are equal, round, and reactive to light.  Cardiovascular:     Rate and Rhythm: Normal rate and regular rhythm.     Heart sounds: No murmur heard.    Comments: Radial pulses 2+ bilaterally Pulmonary:     Effort: Pulmonary effort is normal. No respiratory distress.     Breath sounds: Normal breath sounds.  Musculoskeletal:     Cervical back: Normal range of motion and neck supple.     Right lower leg:  No edema.     Left lower leg: No edema.  Skin:    General: Skin is warm and dry.  Neurological:     Mental Status: She is alert and oriented to person, place, and time. Mental status is at baseline.  Psychiatric:        Mood and Affect: Mood normal.     ED Results / Procedures / Treatments   Labs (all labs ordered are listed, but only abnormal results are displayed) Labs Reviewed  BASIC METABOLIC PANEL - Abnormal; Notable for the following components:      Result Value   Calcium 8.5 (*)    All other components within normal limits  CBC - Abnormal; Notable for the following components:   MCV 79.0 (*)    All other components within normal limits  D-DIMER, QUANTITATIVE  TROPONIN I (HIGH SENSITIVITY)  TROPONIN I (HIGH SENSITIVITY)    EKG EKG Interpretation  Date/Time:  Tuesday June 24 2022 10:46:17 EDT Ventricular Rate:  69 PR Interval:  152 QRS Duration: 93 QT Interval:  373 QTC Calculation: 400 R Axis:   70 Text Interpretation: Sinus rhythm Confirmed by Vonita Moss [id number]) on 06/24/2022 12:03:03 PM  Radiology DG Chest 2 View  Result Date: 06/24/2022 CLINICAL DATA:  Upper back pain and chest pain EXAM: CHEST - 2 VIEW COMPARISON:  Chest x-ray 07/24/2016 FINDINGS: No consolidation, pneumothorax or effusion. Normal cardiopericardial silhouette without edema. Surgical clips at the thoracic inlet. Mild degenerative changes along the spine. IMPRESSION: No acute cardiopulmonary disease. Electronically Signed   By: Karen Kays M.D.   On: 06/24/2022 12:17    Procedures Procedures    Medications Ordered in ED Medications  aspirin chewable tablet 324 mg (324 mg Oral Given 06/24/22 1214)    ED Course/ Medical Decision Making/ A&P Clinical Course as of 06/24/22 1540  Tue Jun 24, 2022  1524 Signed out to Dr Deretha Emory.  [RP]    Clinical Course User Index [RP] Rondel Baton, MD                             Medical Decision Making Amount and/or Complexity of Data  Reviewed Labs: ordered. Radiology: ordered.  Risk OTC drugs.   Karen Merritt is a 52 y.o. female with comorbidities that complicate the patient evaluation including thyroid disease status post thyroidectomy who presents with chest pain  Initial Ddx:  MI, PE, pneumonia, dissection, pericarditis, costochondritis, reflux  MDM:  Patient is being not clearly consistent with any of the above diagnoses.  Was having some tingling of her hands which I suspect may be due to anxiety.  With the patient's chest discomfort will  obtain EKG and troponins to evaluate for MI.  Also considering pulmonary embolism but patient is not high risk so we will obtain a D-dimer at this time.  Considered dissection but with their symmetric pulses, history, and description of the pain feel it is less likely.  If chest x-ray reveals widened mediastinum or any other concerning findings will consider CTA.  Also considered pericarditis but description is unlikely and they do not have risk factors for this diagnosis.  Chest pain not reproducible so feel it costochondritis less likely.  No infectious symptoms to suggest pneumonia at this time that would be causing pleuritic chest pain.  Plan:  Labs Troponin D-dimer EKG Chest x-ray Aspirin  ED Summary/Re-evaluation:  Patient went the above workup which did not reveal any concerning abnormalities.  Initial troponin EKG without ischemic changes.  D-dimer WNL.  Is awaiting a repeat troponin at this time since her pain started just prior to her ED visit.  If normal will have her follow-up with cardiology as an outpatient as well as her primary doctor.  This patient presents to the ED for concern of complaints listed in HPI, this involves an extensive number of treatment options, and is a complaint that carries with it a high risk of complications and morbidity. Disposition including potential need for admission considered.   Dispo: DC Home. Return precautions discussed  including, but not limited to, those listed in the AVS. Allowed pt time to ask questions which were answered fully prior to dc.  Records reviewed Outpatient Clinic Notes The following labs were independently interpreted: Chemistry and show no acute abnormality I independently reviewed the following imaging with scope of interpretation limited to determining acute life threatening conditions related to emergency care: Chest x-ray and agree with the radiologist interpretation with the following exceptions: none I personally reviewed and interpreted cardiac monitoring: sinus bradycardia I personally reviewed and interpreted the pt's EKG: see above for interpretation  I have reviewed the patients home medications and made adjustments as needed       Final Clinical Impression(s) / ED Diagnoses Final diagnoses:  Chest pain, unspecified type    Rx / DC Orders ED Discharge Orders          Ordered    Ambulatory referral to Cardiology        06/24/22 1525              Rondel Baton, MD 06/24/22 1540

## 2022-06-24 NOTE — Telephone Encounter (Signed)
Initial Comment Caller states she woke up with chest pain and tingling on her right arm. Caller states on the back to work she started having pain in his back and tingling on her lips. Caller states he blood pressure was high. Translation No Nurse Assessment Nurse: Suezanne Jacquet, RN, Riley Lam Date/Time (Eastern Time): 06/24/2022 10:10:45 AM Confirm and document reason for call. If symptomatic, describe symptoms. ---Chest pain started on the way to work this morning . Noticed when she woke up she had low back pain. Got out o f bed and noticed in the right breast to the center of the chest and walking around didnt help. Worse with deep breath. Had been working in the yard yesterday but didnt work very hard. Had used the shovel some but was not for very long. Right hand is feeling numb right now. Lips are now tingling. Does the patient have any new or worsening symptoms? ---Yes Will a triage be completed? ---Yes Related visit to physician within the last 2 weeks? ---No Does the PT have any chronic conditions? (i.e. diabetes, asthma, this includes High risk factors for pregnancy, etc.) ---Yes List chronic conditions. ---hypothyroid takes synthroid. Is the patient pregnant or possibly pregnant? (Ask all females between the ages of 23-55) ---No Is this a behavioral health or substance abuse call? ---No PLEASE NOTE: All timestamps contained within this report are represented as [place] Standard Time. CONFIDENTIALTY NOTICE: This fax transmission is intended only for the addressee. It contains information that is legally privileged, confidential or otherwise protected from use or disclosure. If you are not the intended recipient, you are strictly prohibited from reviewing, disclosing, copying using or disseminating any of this information or taking any action in reliance on or regarding this information. If you have received this fax in error, please notify us immediately by telephone so that we can  arrange for its return to [place]. Phone: [phone], Toll-Free: [phone], Fax: [phone] Page: 2 of 2 Call Id: [id number] Guidelines Guideline Title Affirmed Question Affirmed Notes Nurse Date/Time Lamount Cohen Time) Chest Pain [1] Chest pain lasts > 5 minutes AND [2] age > 3 Suezanne Jacquet, RN, Riley Lam 06/24/2022 10:13:27 AM Disp. Time Lamount Cohen Time) Disposition Final User 06/24/2022 10:04:28 AM Send to Urgent Marrian Salvage, [place] 06/24/2022 10:15:06 AM Call EMS 911 Now Yes Suezanne Jacquet, RN, [facility] 06/24/2022 10:18:11 AM 911 Outcome Documentation Suezanne Jacquet, RN, Riley Lam Reason: Coworker driving Final Disposition 06/24/2022 10:15:06 AM Call EMS 911 Now Yes Suezanne Jacquet, RN, [place] [facility] Disagree/Comply Comply Caller Understands Yes PreDisposition InappropriateToAsk Care Advice Given Per Guideline CALL EMS 911 NOW: * Dosage: 162 mg to 325 mg, by mouth, chewed and swallowed. * Available: As either 81 mg (taking two = 162 mg), 162 mg, or 325 mg tablets. CARE ADVICE given per Chest Pain (Adult) guideline. Referrals [facility] - ED

## 2022-06-25 ENCOUNTER — Ambulatory Visit: Payer: 59 | Attending: Internal Medicine | Admitting: Internal Medicine

## 2022-06-25 ENCOUNTER — Encounter: Payer: Self-pay | Admitting: Internal Medicine

## 2022-06-25 VITALS — BP 120/76 | HR 52 | Ht 66.0 in | Wt 177.2 lb

## 2022-06-25 DIAGNOSIS — R0781 Pleurodynia: Secondary | ICD-10-CM

## 2022-06-25 NOTE — Progress Notes (Signed)
Cardiology Office Note:    Date:  06/25/2022   ID:  Karen Merritt, DOB 14-Nov-1970, MRN [id number]  PCP:  Maisie Fus, MD   [facility] Providers Cardiologist:  None     Referring MD: Esperanza Richters, PA-C   No chief complaint on file. Pleuritic Chest pain  History of Present Illness:    Karen Merritt is a 52 y.o. female with a hx of hypothyroidism s/p thyroidectomy 1998, referral for chest pressure yesterday. Prior to the ED she was working her garden prior. That AM she woke up then noted she felt pain in her back. She then felt persistent CP that was worse when she breathed in. She then felt pain down her arm, Felt tingling in her lips.  She went to the ED, There, she was HDS. Her EKG showed normal rhythm. Troponin x2.  No hx of RA. No coronary dx hx.  She walks daily with her dog, and she works in the yard. No symptoms with activity.  Mother had MI, stroke in her 47s. No smoking or DM2.   Past Medical History:  Diagnosis Date   Chest pain    a. non-cardiac 2005, 2012.   Hemorrhoids 11-21-2009   colonoscopy   Hypertrophy, anal papillae 11-21-2009   colonoscopy   Hypothyroidism    a. s/p thyroidectomy ~ 1998   Palpitations     Past Surgical History:  Procedure Laterality Date   bone spur  6/11   right big toe   SHOULDER ARTHROSCOPY WITH SUBACROMIAL DECOMPRESSION AND OPEN ROTATOR C Right 05/23/2013   Procedure: RIGHT SHOULDER ARTHROSCOPY WITH SUBACROMIAL DECOMPRESSION AND MINI OPEN ROTATOR CUFF REPAIR, AND DISTAL CLAVICLE RESECTION;  Surgeon: Verlee Rossetti, MD;  Location: [facility] OR;  Service: Orthopedics;  Laterality: Right;   stress cardiolite  05/23/03   THYROIDECTOMY  12/29/96   TOTAL ABDOMINAL HYSTERECTOMY  2008    Current Medications: Current Meds  Medication Sig   acetaminophen (TYLENOL) 500 MG tablet Take 500 mg by mouth every 6 (six) hours as needed for headache (pain).   AMBULATORY NON FORMULARY MEDICATION Nitroglycerine ointment 0.125 %  Apply a pea  sized amount internally four times daily. Dispense 30 GM zero refill   COVID-19 mRNA bivalent vaccine, Pfizer, (PFIZER COVID-19 VAC BIVALENT) injection Inject into the muscle.   COVID-19 mRNA Vac-TriS, Pfizer, (PFIZER-BIONT COVID-19 VAC-TRIS) SUSP injection Inject into the muscle.   dibucaine (NUPERCAINAL) 1 % OINT Place 1 Application rectally as needed for anal irritation.   fluticasone (FLONASE) 50 MCG/ACT nasal spray Place 2 sprays into both nostrils daily.   ibuprofen (ADVIL,MOTRIN) 200 MG tablet Take 400 mg by mouth every 6 (six) hours as needed for headache (pain).   levothyroxine (SYNTHROID) 100 MCG tablet Take 1 tablet (100 mcg total) by mouth daily.   Melatonin 3 MG TABS Take 3 mg by mouth at bedtime as needed (sleep).    Current Facility-Administered Medications for the 06/25/22 encounter (Office Visit) with Maisie Fus, MD  Medication   0.9 %  sodium chloride infusion     Allergies:   Patient has no known allergies.   Social History   Socioeconomic History   Marital status: Divorced    Spouse name: Not on file   Number of children: 2   Years of education: 14   Highest education level: Some college, no degree  Occupational History   Occupation: Quarry manager: [employer]  Tobacco Use   Smoking status: Never  Smokeless tobacco: Never  Vaping Use   Vaping Use: Never used  Substance and Sexual Activity   Alcohol use: No   Drug use: No   Sexual activity: Not on file  Other Topics Concern   Not on file  Social History Narrative   Lives in [place].  Active, walks/jogs.  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   Social Determinants of Health   Financial Resource Strain: Not on file  Food Insecurity: Not on file  Transportation Needs: Not on file  Physical Activity: Not on file  Stress: Not on file  Social  Connections: Not on file     Family History: The patient's family history includes Bone cancer in her maternal aunt; Colon polyps in her mother; Coronary artery disease in her maternal grandfather, maternal uncle, and mother; Diabetes in her maternal grandmother; Glaucoma in her paternal grandmother; Heart attack in her mother; Heart attack (age of onset: 16) in her maternal grandfather; Heart disease in her maternal aunt; Heart disease (age of onset: 67) in her mother; Hypertension in her father, maternal grandmother, maternal uncle, mother, and sister; Liver cancer in her maternal aunt; Liver disease in her paternal grandfather; Migraines in her brother and sister; Other in her father; Stroke in her mother; Thyroid cancer in her maternal aunt; Thyroid disease in her maternal aunt. There is no history of Colon cancer.  ROS:   Please see the history of present illness.     All other systems reviewed and are negative.  EKGs/Labs/Other Studies Reviewed:    The following studies were reviewed today:   EKG:  EKG is  ordered today.  The ekg ordered today demonstrates   06/25/2022- Sinus bradycardia   Recent Labs: 07/19/2021: ALT 16 02/07/2022: TSH 0.54 06/24/2022: BUN 11; Creatinine, Ser 0.72; Hemoglobin 13.0; Platelets 212; Potassium 4.0; Sodium 136   Recent Lipid Panel    Component Value Date/Time   CHOL 190 07/19/2021 0832   TRIG 80.0 07/19/2021 0832   HDL 37.50 (L) 07/19/2021 0832   CHOLHDL 5 07/19/2021 0832   VLDL 16.0 07/19/2021 0832   LDLCALC 137 (H) 07/19/2021 0832   LDLCALC 161 (H) 10/17/2019 0831     Risk Assessment/Calculations:     Physical Exam:    VS:  BP 120/76 (BP Location: Left Arm, Patient Position: Sitting, Cuff Size: Normal)   Pulse (!) 52   Ht 5\' 6"  (1.676 m)   Wt 177 lb 3.2 oz (80.4 kg)   SpO2 98%   BMI 28.60 kg/m     Wt Readings from Last 3 Encounters:  06/25/22 177 lb 3.2 oz (80.4 kg)  06/24/22 175 lb (79.4 kg)  02/07/22 176 lb 3.2 oz (79.9 kg)      GEN:  Well nourished, well developed in no acute distress HEENT: Normal LYMPHATICS: No lymphadenopathy CARDIAC: RRR, no murmurs, rubs, gallops RESPIRATORY:  Clear to auscultation without rales, wheezing or rhonchi  ABDOMEN: Soft, non-tender, non-distended MUSCULOSKELETAL:  No edema; No deformity  SKIN: Warm and dry NEUROLOGIC:  Alert and oriented x 3 PSYCHIATRIC:  Normal affect   ASSESSMENT:    Pleuritic CP: DDX includes MSK vs pericarditis. She has no signs of pericarditis on her EKG. Will get inflammatory markers. Otherwise, no signs of ACS We discussed trying naproxen or tylenol to see if this relieves the pain.  PLAN:  In order of problems listed above:  ESR, CRP Follow up PRN           Medication Adjustments/Labs and Tests Ordered: Current medicines are reviewed at length with the patient today.  Concerns regarding medicines are outlined above.  Orders Placed This Encounter  Procedures   Sedimentation rate   C-reactive protein   EKG 12-Lead   No orders of the defined types were placed in this encounter.   Patient Instructions  Medication Instructions:  Your physician recommends that you continue on your current medications as directed. Please refer to the Current Medication list given to you today.  *If you need a refill on your cardiac medications before your next appointment, please call your pharmacy*   Lab Work: ESR, CRP  If you have labs (blood work) drawn today and your tests are completely normal, you will receive your results only by: MyChart Message (if you have MyChart) OR A paper copy in the mail If you have any lab test that is abnormal or we need to change your treatment, we will call you to review the results.  Follow-Up: At [facility], you and your health needs are our priority.  As part of our continuing mission to provide you with exceptional heart care, we have created designated Provider Care Teams.  These Care Teams include  your primary Cardiologist (physician) and Advanced Practice Providers (APPs -  Physician Assistants and Nurse Practitioners) who all work together to provide you with the care you need, when you need it.  We recommend signing up for the patient portal called "MyChart".  Sign up information is provided on this After Visit Summary.  MyChart is used to connect with patients for Virtual Visits (Telemedicine).  Patients are able to view lab/test results, encounter notes, upcoming appointments, etc.  Non-urgent messages can be sent to your provider as well.   To learn more about what you can do with MyChart, go to [email or web address].    Your next appointment:   AS NEEDED   Provider:   Dr. Carolan Clines         Signed, Maisie Fus, MD  06/25/2022 2:17 PM    [facility]

## 2022-06-25 NOTE — Patient Instructions (Signed)
Medication Instructions:  Your physician recommends that you continue on your current medications as directed. Please refer to the Current Medication list given to you today.  *If you need a refill on your cardiac medications before your next appointment, please call your pharmacy*   Lab Work: ESR, CRP  If you have labs (blood work) drawn today and your tests are completely normal, you will receive your results only by: MyChart Message (if you have MyChart) OR A paper copy in the mail If you have any lab test that is abnormal or we need to change your treatment, we will call you to review the results.  Follow-Up: At [facility], you and your health needs are our priority.  As part of our continuing mission to provide you with exceptional heart care, we have created designated Provider Care Teams.  These Care Teams include your primary Cardiologist (physician) and Advanced Practice Providers (APPs -  Physician Assistants and Nurse Practitioners) who all work together to provide you with the care you need, when you need it.  We recommend signing up for the patient portal called "MyChart".  Sign up information is provided on this After Visit Summary.  MyChart is used to connect with patients for Virtual Visits (Telemedicine).  Patients are able to view lab/test results, encounter notes, upcoming appointments, etc.  Non-urgent messages can be sent to your provider as well.   To learn more about what you can do with MyChart, go to [email or web address].    Your next appointment:   AS NEEDED   Provider:   Dr. Carolan Clines

## 2022-06-26 LAB — SEDIMENTATION RATE: Sed Rate: 2 mm/hr (ref 0–40)

## 2022-06-26 LAB — C-REACTIVE PROTEIN: CRP: <1 mg/L (ref 0–10)

## 2022-06-27 ENCOUNTER — Ambulatory Visit (INDEPENDENT_AMBULATORY_CARE_PROVIDER_SITE_OTHER): Payer: 59 | Admitting: Medical

## 2022-06-27 VITALS — BP 118/72 | HR 62 | Resp 18 | Ht 66.0 in | Wt 172.8 lb

## 2022-06-27 DIAGNOSIS — K6289 Other specified diseases of anus and rectum: Secondary | ICD-10-CM | POA: Diagnosis not present

## 2022-06-27 DIAGNOSIS — M940 Chondrocostal junction syndrome [Tietze]: Secondary | ICD-10-CM

## 2022-06-27 MED ORDER — DIBUCAINE (PERIANAL) 1 % EX OINT: 1.0000 | TOPICAL_OINTMENT | CUTANEOUS | 1 refills | Status: AC | PRN

## 2022-06-27 MED ORDER — METHYLPREDNISOLONE 4 MG PO TABS: ORAL_TABLET | ORAL | 0 refills | Status: DC

## 2022-06-27 NOTE — Patient Instructions (Addendum)
Rt sided chest pain/reproducible on palpation today( think costochondritis). Work up in ED negative and evaluated by cardiologist yesterday. No further cardiac work up ordered. Non cardiac differential dx.   Discussed nsaid vs low dose medrol. Benefit vs risk. We decided to rx medrol 6 day taper.   Still want to follow cardiac risk factors schedule wellness exam in 4-6 weeks. Get fasting lipid panel.  If you have any cardiac or pulmonary symptoms in future try to be seen same day. Depending on symptom might need to recommend ED evaluation vs in office visit.  For rectal pain hx evaluated by GI MD refilled your dubicaine for occasional intermittent pain.  For hx of colon polyps recommend you call your gi office and ask when you are to repeat.  Follow up wellness exam 4-6 weeks or sooner if needed.

## 2022-06-27 NOTE — Progress Notes (Signed)
Subjective:    Patient ID: Karen Merritt, female    DOB: [birth year], 52 y.o.   MRN: [id number]  HPI Pt went to ED the other day.   Arrival date & time: 06/24/22  1038     History      Chief Complaint  Patient presents with   Chest Pain      Karen Merritt is a 52 y.o. female.   52 year old female with a history of thyroid disease status post thyroidectomy who presents emergency department with chest discomfort.  Patient reports that she woke up and had some lower back pain and at 8:30 AM also started experiencing chest pressure.  Currently 5/10 in severity.  Has not taking any medications for the pain.  Not exertional and does not have any diaphoresis or vomiting.  Says that it radiates to her shoulder.  Says that she has not had any shortness of breath or lower extremity swelling recently.  Started having some tingling of her hands and she was concerned so she called her primary doctor who referred her to the emergency department for evaluation.  Personal history of MI or smoking.  Mother had an MI in her 47s.  No history of DVT or PE and is not on blood thinners   Arrival date & time: 06/24/22  1038     History      Chief Complaint  Patient presents with   Chest Pain      Karen Merritt is a 52 y.o. female.   52 year old female with a history of thyroid disease status post thyroidectomy who presents emergency department with chest discomfort.  Patient reports that she woke up and had some lower back pain and at 8:30 AM also started experiencing chest pressure.  Currently 5/10 in severity.  Has not taking any medications for the pain.  Not exertional and does not have any diaphoresis or vomiting.  Says that it radiates to her shoulder.  Says that she has not had any shortness of breath or lower extremity swelling recently.  Started having some tingling of her hands and she was concerned so she called her primary doctor who referred her to the emergency department for  evaluation.  Personal history of MI or smoking.  Mother had an MI in her 32s.  No history of DVT or PE and is not on blood thinners    Arrival date & time: 06/24/22  1038     History      Chief Complaint  Patient presents with   Chest Pain      Karen Merritt is a 52 y.o. female.   52 year old female with a history of thyroid disease status post thyroidectomy who presents emergency department with chest discomfort.  Patient reports that she woke up and had some lower back pain and at 8:30 AM also started experiencing chest pressure.  Currently 5/10 in severity.  Has not taking any medications for the pain.  Not exertional and does not have any diaphoresis or vomiting.  Says that it radiates to her shoulder.  Says that she has not had any shortness of breath or lower extremity swelling recently.  Started having some tingling of her hands and she was concerned so she called her primary doctor who referred her to the emergency department for evaluation.  Personal history of MI or smoking.  Mother had an MI in her 12s.  No history of DVT or PE and is not on blood thinners  Initial Ddx:  MI, PE, pneumonia, dissection, pericarditis, costochondritis, reflux   MDM:  Patient is being not clearly consistent with any of the above diagnoses.  Was having some tingling of her hands which I suspect may be due to anxiety.  With the patient's chest discomfort will obtain EKG and troponins to evaluate for MI.  Also considering pulmonary embolism but patient is not high risk so we will obtain a D-dimer at this time.  Considered dissection but with their symmetric pulses, history, and description of the pain feel it is less likely.  If chest x-ray reveals widened mediastinum or any other concerning findings will consider CTA.  Also considered pericarditis but description is unlikely and they do not have risk factors for this diagnosis.  Chest pain not reproducible so feel it costochondritis less likely.  No  infectious symptoms to suggest pneumonia at this time that would be causing pleuritic chest pain.   Plan:  Labs Troponin D-dimer EKG Chest x-ray Aspirin   ED Summary/Re-evaluation:  Patient went the above workup which did not reveal any concerning abnormalities.  Initial troponin EKG without ischemic changes.  D-dimer WNL.  Is awaiting a repeat troponin at this time since her pain started just prior to her ED visit.  If normal will have her follow-up with cardiology as an outpatient as well as her primary doctor.    Pt states by time she left ED she was feeling a lot better. Majority of all her symptoms seems to have gotten better.    Pericarditis was considered but not likely on ED summary. Pt notes she had pain on deep inspiration and pt states she had pain on palpating her chest wall. That has gotten better and she states that has improved a lot/almost gone on rt side of her chest/consochandral. Pt tells me that pain was never midline or on left side.  She went to cardiologist yesterday.   Cardiologist thought pleuritic chest pain.  ASSESSMENT:     Pleuritic CP: DDX includes MSK vs pericarditis. She has no signs of pericarditis on her EKG. Will get inflammatory markers. Otherwise, no signs of ACS We discussed trying naproxen or tylenol to see if this relieves the pain.    PLAN:     In order of problems listed above:   ESR, CRP Follow up PRN  Pt tried tylenol last night and she states that seemed to help with residual pain/  Sed rate and c reactive protein ordered by cardiologist was negative.   On review past year ldl was high. Hdl was low.   The 10-year ASCVD risk score (Arnett DK, et al., 2019) is: 2.1%   Values used to calculate the score:     Age: 33 years     Sex: Female     Is Non-Hispanic African American: No     Diabetic: No     Tobacco smoker: No     Systolic Blood Pressure: 118 mmHg     Is BP treated: No     HDL Cholesterol: 37.5 mg/dL     Total  Cholesterol: 190 mg/dL     Review of Systems  Constitutional:  Negative for chills, fatigue and fever.  Respiratory:  Negative for cough, chest tightness and shortness of breath.   Cardiovascular:  Negative for chest pain and palpitations.  Gastrointestinal:  Negative for abdominal pain.       Rectal pain. Evaluated by gi md need refill of dibucaine.  Genitourinary:  Negative for dysuria, flank pain and frequency.  Musculoskeletal:  Costochandral type pain. Pain reproducible on palpation.  Neurological:  Negative for dizziness, light-headedness and headaches.  Hematological:  Negative for adenopathy. Does not bruise/bleed easily.  Psychiatric/Behavioral:  Negative for behavioral problems.     Past Medical History:  Diagnosis Date   Chest pain    a. non-cardiac 2005, 2012.   Hemorrhoids 11-21-2009   colonoscopy   Hypertrophy, anal papillae 11-21-2009   colonoscopy   Hypothyroidism    a. s/p thyroidectomy ~ 1998   Palpitations      Social History   Socioeconomic History   Marital status: Divorced    Spouse name: Not on file   Number of children: 2   Years of education: 14   Highest education level: Some college, no degree  Occupational History   Occupation: Quarry manager: [employer]  Tobacco Use   Smoking status: Never   Smokeless tobacco: Never  Vaping Use   Vaping Use: Never used  Substance and Sexual Activity   Alcohol use: No   Drug use: No   Sexual activity: Not on file  Other Topics Concern   Not on file  Social History Narrative   Lives in [place].  Active, walks/jogs.  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   Social Determinants of Health   Financial Resource Strain: Not on file  Food Insecurity: Not on file  Transportation Needs: Not on file  Physical Activity: Not on file  Stress: Not  on file  Social Connections: Not on file  Intimate Partner Violence: Not on file    Past Surgical History:  Procedure Laterality Date   bone spur  6/11   right big toe   SHOULDER ARTHROSCOPY WITH SUBACROMIAL DECOMPRESSION AND OPEN ROTATOR C Right 05/23/2013   Procedure: RIGHT SHOULDER ARTHROSCOPY WITH SUBACROMIAL DECOMPRESSION AND MINI OPEN ROTATOR CUFF REPAIR, AND DISTAL CLAVICLE RESECTION;  Surgeon: Verlee Rossetti, MD;  Location: [facility] OR;  Service: Orthopedics;  Laterality: Right;   stress cardiolite  05/23/03   THYROIDECTOMY  12/29/96   TOTAL ABDOMINAL HYSTERECTOMY  2008    Family History  Problem Relation Age of Onset   Hypertension Mother    Colon polyps Mother    Coronary artery disease Mother        alive @ 42.   Heart disease Mother 21       10/20/13   Stroke Mother    Heart attack Mother    Other Father        alive and well @ 21.   Hypertension Father    Hypertension Sister    Migraines Brother    Migraines Sister    Bone cancer Maternal Aunt    Thyroid disease Maternal Aunt    Thyroid cancer Maternal Aunt    Liver cancer Maternal Aunt    Hypertension Maternal Grandmother    Diabetes Maternal Grandmother    Coronary artery disease Maternal Grandfather    Heart attack Maternal Grandfather 60   Coronary artery disease Maternal Uncle    Hypertension Maternal Uncle    Heart disease Maternal Aunt    Glaucoma Paternal Grandmother    Liver disease Paternal Grandfather    Colon cancer Neg Hx     No Known Allergies  Current Outpatient Medications on File Prior to Visit  Medication Sig Dispense Refill   acetaminophen (TYLENOL) 500  MG tablet Take 500 mg by mouth every 6 (six) hours as needed for headache (pain).     AMBULATORY NON FORMULARY MEDICATION Nitroglycerine ointment 0.125 %  Apply a pea sized amount internally four times daily. Dispense 30 GM zero refill 30 g 0   COVID-19 mRNA bivalent vaccine, Pfizer, (PFIZER COVID-19 VAC BIVALENT) injection Inject into the  muscle. 0.3 mL 0   COVID-19 mRNA Vac-TriS, Pfizer, (PFIZER-BIONT COVID-19 VAC-TRIS) SUSP injection Inject into the muscle. 0.3 mL 0   cyclobenzaprine (FLEXERIL) 5 MG tablet Take 1 tablet (5 mg total) by mouth at bedtime. (Patient not taking: Reported on 06/25/2022) 4 tablet 0   dibucaine (NUPERCAINAL) 1 % OINT Place 1 Application rectally as needed for anal irritation. 28 g 1   fluticasone (FLONASE) 50 MCG/ACT nasal spray Place 2 sprays into both nostrils daily. 16 g 6   ibuprofen (ADVIL,MOTRIN) 200 MG tablet Take 400 mg by mouth every 6 (six) hours as needed for headache (pain).     levothyroxine (SYNTHROID) 100 MCG tablet Take 1 tablet (100 mcg total) by mouth daily. 90 tablet 1   Melatonin 3 MG TABS Take 3 mg by mouth at bedtime as needed (sleep).      meloxicam (MOBIC) 15 MG tablet Take 1 tablet daily with food for 7 days. Then take as needed. (Patient not taking: Reported on 06/25/2022) 40 tablet 0   Sod Picosulfate-Mag Ox-Cit Acd (CLENPIQ) 10-3.5-12 MG-GM -GM/160ML SOLN Take 1 kit by mouth as directed. (Patient not taking: Reported on 06/25/2022) 320 mL 0   Current Facility-Administered Medications on File Prior to Visit  Medication Dose Route Frequency Provider Last Rate Last Admin   0.9 %  sodium chloride infusion  500 mL Intravenous Continuous Cirigliano, Vito V, DO        BP 118/72   Pulse 62   Resp 18   Ht 5\' 6"  (1.676 m)   Wt 172 lb 12.8 oz (78.4 kg)   SpO2 99%   BMI 27.89 kg/m        Objective:   Physical Exam  General Mental Status- Alert. General Appearance- Not in acute distress.   Skin General: Color- Normal Color. Moisture- Normal Moisture.  Neck Carotid Arteries- Normal color. Moisture- Normal Moisture. No carotid bruits. No JVD.  Chest and Lung Exam Auscultation: Breath Sounds:-Normal.  Cardiovascular Auscultation:Rythm- Regular. Murmurs & Other Heart Sounds:Auscultation of the heart reveals- No Murmurs.  Abdomen Inspection:-Inspeection  Normal. Palpation/Percussion:Note:No mass. Palpation and Percussion of the abdomen reveal- Non Tender, Non Distended + BS, no rebound or guarding.   Neurologic Cranial Nerve exam:- CN III-XII intact(No nystagmus), symmetric smile. Strength:- 5/5 equal and symmetric strength both upper and lower extremities.   Anterior thorax- rt side constochondral junction tenderness. 3-5/10 pain on palpation.     Assessment & Plan:   Patient Instructions  Rt sided chest pain/reproducible on palpation today. Work up in ED negative and evaluated by cardiologist yesterday. No further cardiac work up ordered. Non cardiac differential dx.   Discussed nsaid vs low dose medrol. Benefit vs risk. We decided to rx medrol 6 day taper.   Still want to follow cardiac risk factors schedule wellness exam in 4-6 weeks. Get fasting lipid panel.  If you have any cardiac or pulmonary symptoms in future try to be seen same day. Depending on symptom might need to recommend ED evaluation vs in office visit.  For rectal pain hx evaluated by GI MD refilled your dubicaine for occasional intermittent pain.  For hx of  colon polyps recommend you call your gi office and ask when you are to repeat.  Follow up wellness exam 4-6 weeks or sooner if needed.      Esperanza Richters, PA-C   Time spent with patient today was 41  minutes which consisted of chart revdiew, discussing diagnosis, work up treatment and documentation.

## 2022-07-17 ENCOUNTER — Other Ambulatory Visit: Payer: Self-pay

## 2022-07-17 DIAGNOSIS — E890 Postprocedural hypothyroidism: Secondary | ICD-10-CM

## 2022-07-17 MED ORDER — LEVOTHYROXINE SODIUM 100 MCG PO TABS
100.0000 ug | ORAL_TABLET | Freq: Every day | ORAL | 0 refills | Status: DC
Start: 2022-07-17 — End: 2022-09-01

## 2022-07-21 ENCOUNTER — Encounter: Payer: 59 | Admitting: Medical

## 2022-07-30 ENCOUNTER — Ambulatory Visit (INDEPENDENT_AMBULATORY_CARE_PROVIDER_SITE_OTHER): Payer: 59 | Admitting: Medical

## 2022-07-30 ENCOUNTER — Encounter: Payer: Self-pay | Admitting: Medical

## 2022-07-30 VITALS — BP 114/70 | HR 55 | Ht 66.0 in | Wt 176.6 lb

## 2022-07-30 DIAGNOSIS — Z0000 Encounter for general adult medical examination without abnormal findings: Secondary | ICD-10-CM | POA: Diagnosis not present

## 2022-07-30 DIAGNOSIS — Z13220 Encounter for screening for lipoid disorders: Secondary | ICD-10-CM

## 2022-07-30 LAB — COMPREHENSIVE METABOLIC PANEL
ALT: 14 U/L (ref 0–35)
AST: 16 U/L (ref 0–37)
Albumin: 4.1 g/dL (ref 3.5–5.2)
Alkaline Phosphatase: 67 U/L (ref 39–117)
BUN: 9 mg/dL (ref 6–23)
CO2: 30 mEq/L (ref 19–32)
Calcium: 9.5 mg/dL (ref 8.4–10.5)
Chloride: 106 mEq/L (ref 96–112)
Creatinine, Ser: 0.84 mg/dL (ref 0.40–1.20)
GFR: 78.91 mL/min (ref >60.00)
Glucose, Bld: 86 mg/dL (ref 70–99)
Potassium: 4.5 mEq/L (ref 3.5–5.1)
Sodium: 141 mEq/L (ref 135–145)
Total Bilirubin: 0.6 mg/dL (ref 0.2–1.2)
Total Protein: 6.6 g/dL (ref 6.0–8.3)

## 2022-07-30 LAB — LIPID PANEL
Cholesterol: 218 mg/dL — ABNORMAL HIGH (ref 0–200)
HDL: 42.4 mg/dL (ref >39.00)
LDL Cholesterol: 154 mg/dL — ABNORMAL HIGH (ref 0–99)
NonHDL: 176.03
Total CHOL/HDL Ratio: 5
Triglycerides: 110 mg/dL (ref 0.0–149.0)
VLDL: 22 mg/dL (ref 0.0–40.0)

## 2022-07-30 LAB — CBC WITH DIFFERENTIAL/PLATELET
Basophils Absolute: 0 10*3/uL (ref 0.0–0.1)
Basophils Relative: 0.9 % (ref 0.0–3.0)
Eosinophils Absolute: 0.2 10*3/uL (ref 0.0–0.7)
Eosinophils Relative: 3.1 % (ref 0.0–5.0)
HCT: 38.3 % (ref 36.0–46.0)
Hemoglobin: 12.9 g/dL (ref 12.0–15.0)
Lymphocytes Relative: 44.1 % (ref 12.0–46.0)
Lymphs Abs: 2.3 10*3/uL (ref 0.7–4.0)
MCHC: 33.7 g/dL (ref 30.0–36.0)
MCV: 83.4 fl (ref 78.0–100.0)
Monocytes Absolute: 0.3 10*3/uL (ref 0.1–1.0)
Monocytes Relative: 6.4 % (ref 3.0–12.0)
Neutro Abs: 2.4 10*3/uL (ref 1.4–7.7)
Neutrophils Relative %: 45.5 % (ref 43.0–77.0)
Platelets: 215 10*3/uL (ref 150.0–400.0)
RBC: 4.6 Mil/uL (ref 3.87–5.11)
RDW: 14.1 % (ref 11.5–15.5)
WBC: 5.2 10*3/uL (ref 4.0–10.5)

## 2022-07-30 MED ORDER — CYCLOBENZAPRINE HCL 5 MG PO TABS: 5.0000 mg | ORAL_TABLET | Freq: Every day | ORAL | 0 refills | Status: DC

## 2022-07-30 NOTE — Progress Notes (Signed)
Subjective:    Patient ID: Karen Merritt, female    DOB: 1970-09-08, 52 y.o.   MRN: [id number]  HPI Pt in for wellness exam. She is fasting.   Works  [employer]. Pt does walk dog daily 1.5 miles. Trying to eat healthy. Less sugar and low cholesterol diet. 1 cup coffee a day.   Pt is has appt coming up to get repeat colonoscopy in August.   Pt states she is current on mammogram.  Also states up to date on papsmear.  Shingrix vaccine- pt declines presently.       Review of Systems  Constitutional:  Negative for chills, fatigue and fever.  HENT:  Negative for congestion, drooling and ear pain.   Respiratory:  Negative for cough, chest tightness, shortness of breath and wheezing.   Cardiovascular:  Negative for chest pain and palpitations.  Gastrointestinal:  Negative for abdominal pain, blood in stool, diarrhea, rectal pain and vomiting.  Genitourinary:  Negative for dyspareunia, enuresis and flank pain.  Musculoskeletal:  Negative for back pain and myalgias.  Skin:  Negative for rash.  Neurological:  Negative for dizziness, speech difficulty, weakness, light-headedness, numbness and headaches.  Hematological:  Negative for adenopathy. Does not bruise/bleed easily.  Psychiatric/Behavioral:  Negative for behavioral problems, decreased concentration, dysphoric mood, self-injury and suicidal ideas. The patient is not hyperactive.    Past Medical History:  Diagnosis Date   Chest pain    a. non-cardiac 2005, 2012.   Hemorrhoids 11-21-2009   colonoscopy   Hypertrophy, anal papillae 11-21-2009   colonoscopy   Hypothyroidism    a. s/p thyroidectomy ~ 1998   Palpitations      Social History   Socioeconomic History   Marital status: Divorced    Spouse name: Not on file   Number of children: 2   Years of education: 14   Highest education level: Some college, no degree  Occupational History   Occupation: Quarry manager: [employer]  Tobacco Use   Smoking  status: Never   Smokeless tobacco: Never  Vaping Use   Vaping Use: Never used  Substance and Sexual Activity   Alcohol use: No   Drug use: No   Sexual activity: Not on file  Other Topics Concern   Not on file  Social History Narrative   Lives in [place].  Active, walks/jogs.  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   Social Determinants of Health   Financial Resource Strain: Not on file  Food Insecurity: Not on file  Transportation Needs: Not on file  Physical Activity: Not on file  Stress: Not on file  Social Connections: Not on file  Intimate Partner Violence: Not on file    Past Surgical History:  Procedure Laterality Date   bone spur  6/11   right big toe   SHOULDER ARTHROSCOPY WITH SUBACROMIAL DECOMPRESSION AND OPEN ROTATOR C Right 05/23/2013   Procedure: RIGHT SHOULDER ARTHROSCOPY WITH SUBACROMIAL DECOMPRESSION AND MINI OPEN ROTATOR CUFF REPAIR, AND DISTAL CLAVICLE RESECTION;  Surgeon: Verlee Rossetti, MD;  Location: [facility] OR;  Service: Orthopedics;  Laterality: Right;   stress cardiolite  05/23/03   THYROIDECTOMY  12/29/96   TOTAL ABDOMINAL HYSTERECTOMY  2008    Family History  Problem Relation Age of Onset   Hypertension Mother    Colon polyps Mother  Coronary artery disease Mother        alive @ 47.   Heart disease Mother 81       10/20/13   Stroke Mother    Heart attack Mother    Other Father        alive and well @ 77.   Hypertension Father    Hypertension Sister    Migraines Brother    Migraines Sister    Bone cancer Maternal Aunt    Thyroid disease Maternal Aunt    Thyroid cancer Maternal Aunt    Liver cancer Maternal Aunt    Hypertension Maternal Grandmother    Diabetes Maternal Grandmother    Coronary artery disease Maternal Grandfather    Heart attack Maternal Grandfather 60   Coronary artery disease  Maternal Uncle    Hypertension Maternal Uncle    Heart disease Maternal Aunt    Glaucoma Paternal Grandmother    Liver disease Paternal Grandfather    Colon cancer Neg Hx     No Known Allergies  Current Outpatient Medications on File Prior to Visit  Medication Sig Dispense Refill   acetaminophen (TYLENOL) 500 MG tablet Take 500 mg by mouth every 6 (six) hours as needed for headache (pain).     AMBULATORY NON FORMULARY MEDICATION Nitroglycerine ointment 0.125 %  Apply a pea sized amount internally four times daily. Dispense 30 GM zero refill 30 g 0   COVID-19 mRNA bivalent vaccine, Pfizer, (PFIZER COVID-19 VAC BIVALENT) injection Inject into the muscle. 0.3 mL 0   COVID-19 mRNA Vac-TriS, Pfizer, (PFIZER-BIONT COVID-19 VAC-TRIS) SUSP injection Inject into the muscle. 0.3 mL 0   cyclobenzaprine (FLEXERIL) 5 MG tablet Take 1 tablet (5 mg total) by mouth at bedtime. 4 tablet 0   dibucaine (NUPERCAINAL) 1 % OINT Place 1 Application rectally as needed for anal irritation. 28 g 1   fluticasone (FLONASE) 50 MCG/ACT nasal spray Place 2 sprays into both nostrils daily. 16 g 6   ibuprofen (ADVIL,MOTRIN) 200 MG tablet Take 400 mg by mouth every 6 (six) hours as needed for headache (pain).     levothyroxine (SYNTHROID) 100 MCG tablet Take 1 tablet (100 mcg total) by mouth daily. 30 tablet 0   Melatonin 3 MG TABS Take 3 mg by mouth at bedtime as needed (sleep).      meloxicam (MOBIC) 15 MG tablet Take 1 tablet daily with food for 7 days. Then take as needed. (Patient not taking: Reported on 06/25/2022) 40 tablet 0   methylPREDNISolone (MEDROL) 4 MG tablet Standard 6 day taper (Patient not taking: Reported on 07/30/2022) 21 tablet 0   Sod Picosulfate-Mag Ox-Cit Acd (CLENPIQ) 10-3.5-12 MG-GM -GM/160ML SOLN Take 1 kit by mouth as directed. (Patient not taking: Reported on 06/25/2022) 320 mL 0   Current Facility-Administered Medications on File Prior to Visit  Medication Dose Route Frequency Provider Last Rate  Last Admin   0.9 %  sodium chloride infusion  500 mL Intravenous Continuous Cirigliano, Vito V, DO        BP 114/70   Pulse (!) 55   Ht 5\' 6"  (1.676 m)   Wt 176 lb 9.6 oz (80.1 kg)   SpO2 99%   BMI 28.50 kg/m          Objective:   Physical Exam  General Mental Status- Alert. General Appearance- Not in acute distress.   Skin General: Color- Normal Color. Moisture- Normal Moisture.  Neck Carotid Arteries- Normal color. Moisture- Normal Moisture. No carotid bruits. No JVD.  Chest and Lung Exam Auscultation: Breath Sounds:-Normal.  Cardiovascular Auscultation:Rythm- Regular. Murmurs & Other Heart Sounds:Auscultation of the heart reveals- No Murmurs.  Abdomen Inspection:-Inspeection Normal. Palpation/Percussion:Note:No mass. Palpation and Percussion of the abdomen reveal- Non Tender, Non Distended + BS, no rebound or guarding.   Neurologic Cranial Nerve exam:- CN III-XII intact(No nystagmus), symmetric smile. Strength:- 5/5 equal and symmetric strength both upper and lower extremities.       Assessment & Plan:   Patient Instructions  For you wellness exam today I have ordered cbc, cmp and  lipid panel.  Please consider shingrix vaccine. If you decide can call and get scheduled as nurse visit.  Ask you have gyn send [place] copy of your mammogram or if you have report scan to my chart.  Recommend exercise and healthy diet.  We will let you know lab results as they come in.  Follow up date appointment will be determined after lab review.        Esperanza Richters, PA-C

## 2022-07-30 NOTE — Addendum Note (Signed)
Addended by: Gwenevere Abbot on: 07/30/2022 10:19 AM   Modules accepted: Orders

## 2022-07-30 NOTE — Patient Instructions (Addendum)
For you wellness exam today I have ordered cbc, cmp and  lipid panel.  Please consider shingrix vaccine. If you decide can call and get scheduled as nurse visit.  Ask you have gyn send [place] copy of your mammogram or if you have report scan to my chart.  Recommend exercise and healthy diet.  We will let you know lab results as they come in.  Follow up date appointment will be determined after lab review.    Preventive Care 68-54 Years Old, Female Preventive care refers to lifestyle choices and visits with your health care provider that can promote health and wellness. Preventive care visits are also called wellness exams. What can I expect for my preventive care visit? Counseling Your health care provider may ask you questions about your: Medical history, including: Past medical problems. Family medical history. Pregnancy history. Current health, including: Menstrual cycle. Method of birth control. Emotional well-being. Home life and relationship well-being. Sexual activity and sexual health. Lifestyle, including: Alcohol, nicotine or tobacco, and drug use. Access to firearms. Diet, exercise, and sleep habits. Work and work Astronomer. Sunscreen use. Safety issues such as seatbelt and bike helmet use. Physical exam Your health care provider will check your: Height and weight. These may be used to calculate your BMI (body mass index). BMI is a measurement that tells if you are at a healthy weight. Waist circumference. This measures the distance around your waistline. This measurement also tells if you are at a healthy weight and may help predict your risk of certain diseases, such as type 2 diabetes and high blood pressure. Heart rate and blood pressure. Body temperature. Skin for abnormal spots. What immunizations do I need?  Vaccines are usually given at various ages, according to a schedule. Your health care provider will recommend vaccines for you based on your age,  medical history, and lifestyle or other factors, such as travel or where you work. What tests do I need? Screening Your health care provider may recommend screening tests for certain conditions. This may include: Lipid and cholesterol levels. Diabetes screening. This is done by checking your blood sugar (glucose) after you have not eaten for a while (fasting). Pelvic exam and Pap test. Hepatitis B test. Hepatitis C test. HIV (human immunodeficiency virus) test. STI (sexually transmitted infection) testing, if you are at risk. Lung cancer screening. Colorectal cancer screening. Mammogram. Talk with your health care provider about when you should start having regular mammograms. This may depend on whether you have a family history of breast cancer. BRCA-related cancer screening. This may be done if you have a family history of breast, ovarian, tubal, or peritoneal cancers. Bone density scan. This is done to screen for osteoporosis. Talk with your health care provider about your test results, treatment options, and if necessary, the need for more tests. Follow these instructions at home: Eating and drinking  Eat a diet that includes fresh fruits and vegetables, whole grains, lean protein, and low-fat dairy products. Take vitamin and mineral supplements as recommended by your health care provider. Do not drink alcohol if: Your health care provider tells you not to drink. You are pregnant, may be pregnant, or are planning to become pregnant. If you drink alcohol: Limit how much you have to 0-1 drink a day. Know how much alcohol is in your drink. In the [place] one drink equals one 12 oz bottle of beer (355 mL), one 5 oz glass of wine (148 mL), or one 1 oz glass of hard liquor (44  mL). Lifestyle Brush your teeth every morning and night with fluoride toothpaste. Floss one time each day. Exercise for at least 30 minutes 5 or more days each week. Do not use any products that contain nicotine or  tobacco. These products include cigarettes, chewing tobacco, and vaping devices, such as e-cigarettes. If you need help quitting, ask your health care provider. Do not use drugs. If you are sexually active, practice safe sex. Use a condom or other form of protection to prevent STIs. If you do not wish to become pregnant, use a form of birth control. If you plan to become pregnant, see your health care provider for a prepregnancy visit. Take aspirin only as told by your health care provider. Make sure that you understand how much to take and what form to take. Work with your health care provider to find out whether it is safe and beneficial for you to take aspirin daily. Find healthy ways to manage stress, such as: Meditation, yoga, or listening to music. Journaling. Talking to a trusted person. Spending time with friends and family. Minimize exposure to UV radiation to reduce your risk of skin cancer. Safety Always wear your seat belt while driving or riding in a vehicle. Do not drive: If you have been drinking alcohol. Do not ride with someone who has been drinking. When you are tired or distracted. While texting. If you have been using any mind-altering substances or drugs. Wear a helmet and other protective equipment during sports activities. If you have firearms in your house, make sure you follow all gun safety procedures. Seek help if you have been physically or sexually abused. What's next? Visit your health care provider once a year for an annual wellness visit. Ask your health care provider how often you should have your eyes and teeth checked. Stay up to date on all vaccines. This information is not intended to replace advice given to you by your health care provider. Make sure you discuss any questions you have with your health care provider. Document Revised: 07/04/2020 Document Reviewed: 07/04/2020 Elsevier Patient Education  2024 [employer].

## 2022-08-08 ENCOUNTER — Ambulatory Visit (INDEPENDENT_AMBULATORY_CARE_PROVIDER_SITE_OTHER): Payer: 59 | Admitting: Internal Medicine

## 2022-08-08 ENCOUNTER — Encounter: Payer: Self-pay | Admitting: Internal Medicine

## 2022-08-08 VITALS — BP 112/62 | HR 69 | Ht 66.0 in | Wt 179.8 lb

## 2022-08-08 DIAGNOSIS — E890 Postprocedural hypothyroidism: Secondary | ICD-10-CM | POA: Diagnosis not present

## 2022-08-08 DIAGNOSIS — L659 Nonscarring hair loss, unspecified: Secondary | ICD-10-CM

## 2022-08-08 NOTE — Patient Instructions (Signed)
Please continue Levothyroxine 100 mcg daily.  Take the thyroid hormone every day, with water, at least 30 minutes before breakfast, separated by at least 4 hours from: - acid reflux medications - calcium - iron - multivitamins  Please stop at the lab.  Please come back for a follow-up appointment in 1 year, but for labs in 6 months.

## 2022-08-08 NOTE — Progress Notes (Signed)
Patient ID: KAYLANNI EZELLE, female   DOB: Dec 03, 1970, 52 y.o.   MRN: [id number]  HPI  GURNEET MATARESE is a 52 y.o.-year-old female, returning for follow-up for uncontrolled postsurgical hypothyroidism.  She previously saw Dr. Everardo All, but last visit with me 6 months ago.  Interim history: She feels well, without complaints today. She had costochondritis since last OV. She was started on Flexeril. She had poison ivy rash >> had steroid 1.5 mo ago.  Reviewed and addended history: Pt. 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 07/2021: LT4 dose increased from 100 >> 125 mcg daily 09/2021: LT4 dose increased from 125 >> 112 mcg daily 10/2021: LT4 dose increased from 112 >> 100 mcg daily  Pt is on levothyroxine 100 mcg daily: - in am - fasting - at least 30 min from b'fast - no calcium - no iron - no multivitamins - no PPIs - not on Biotin  I reviewed pt's thyroid tests: Lab Results  Component Value Date   TSH 0.54 02/07/2022   TSH 0.64 12/19/2021   TSH 0.05 (L) 11/04/2021   TSH 0.27 (L) 09/20/2021   TSH 12.02 (H) 08/07/2021   TSH 7.54 (H) 11/26/2020   TSH 2.77 11/21/2019   TSH 2.37 11/15/2018   TSH 0.44 08/24/2017   TSH 1.90 10/14/2016   FREET4 1.15 02/07/2022   FREET4 0.97 12/19/2021   FREET4 1.15 11/04/2021   FREET4 1.25 09/20/2021   FREET4 0.92 08/07/2021   FREET4 1.06 11/26/2020   FREET4 0.84 11/21/2019   FREET4 0.92 11/15/2018   FREET4 0.81 10/14/2016   FREET4 0.81 09/28/2015   T3FREE 3.0 09/28/2015   T3FREE 3.3 07/28/2014   T3FREE 3.0 07/18/2014   T3FREE 3.4 06/29/2009   Antithyroid antibodies: No results found for: "THGAB" No components found for: "TPOAB"  Pt denies: - weight gain - fatigue - cold intolerance - depression - constipation - dry skin  She has: - hair loss, which is longstanding  Pt denies feeling nodules in neck,  hoarseness, dysphagia/odynophagia.  She has + FH of thyroid disease in: Maternal aunt >> thyroid cancer.  No h/o radiation tx to head or neck. No use of iodine supplements or steroids  ROS: + See HPI  Past Medical History:  Diagnosis Date   Chest pain    a. non-cardiac 2005, 2012.   Hemorrhoids 11-21-2009   colonoscopy   Hypertrophy, anal papillae 11-21-2009   colonoscopy   Hypothyroidism    a. s/p thyroidectomy ~ 1998   Palpitations    Past Surgical History:  Procedure Laterality Date   bone spur  6/11   right big toe   SHOULDER ARTHROSCOPY WITH SUBACROMIAL DECOMPRESSION AND OPEN ROTATOR C Right 05/23/2013   Procedure: RIGHT SHOULDER ARTHROSCOPY WITH SUBACROMIAL DECOMPRESSION AND MINI OPEN ROTATOR CUFF REPAIR, AND DISTAL CLAVICLE RESECTION;  Surgeon: Verlee Rossetti, MD;  Location: [facility] OR;  Service: Orthopedics;  Laterality: Right;   stress cardiolite  05/23/03   THYROIDECTOMY  12/29/96   TOTAL ABDOMINAL HYSTERECTOMY  2008   Social History   Socioeconomic History   Marital status: Divorced    Spouse name: Not on file   Number of children: 2   Years of education: 14   Highest education level: Some college, no degree  Occupational History   Occupation: Quarry manager: [employer]  Tobacco Use   Smoking status: Never   Smokeless tobacco: Never  Vaping Use   Vaping status: Never Used  Substance and Sexual Activity   Alcohol use: No   Drug use: No   Sexual activity: Not on file  Other Topics Concern   Not on file  Social History Narrative   Lives in [place].  Active, walks/jogs.  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   Social Determinants of Health   Financial Resource Strain: Not on file  Food Insecurity: Medium Risk (05/27/2022)   [facility]   Food vital sign    Within the  past 12 months, you worried that your food would run out before you got money to buy more: Never true    Within the past 12 months, the food you bought just didn't last and you didn't have money to get more. : Sometimes true  Transportation Needs: No Transportation Needs (05/27/2022)   [facility]   Transportation    In the past 12 months, has lack of reliable transportation kept you from medical appointments, meetings, work or from getting things needed for daily living? : No  Physical Activity: Not on file  Stress: Not on file  Social Connections: Not on file  Intimate Partner Violence: Not on file   Current Outpatient Medications on File Prior to Visit  Medication Sig Dispense Refill   acetaminophen (TYLENOL) 500 MG tablet Take 500 mg by mouth every 6 (six) hours as needed for headache (pain).     AMBULATORY NON FORMULARY MEDICATION Nitroglycerine ointment 0.125 %  Apply a pea sized amount internally four times daily. Dispense 30 GM zero refill 30 g 0   COVID-19 mRNA bivalent vaccine, Pfizer, (PFIZER COVID-19 VAC BIVALENT) injection Inject into the muscle. 0.3 mL 0   COVID-19 mRNA Vac-TriS, Pfizer, (PFIZER-BIONT COVID-19 VAC-TRIS) SUSP injection Inject into the muscle. 0.3 mL 0   cyclobenzaprine (FLEXERIL) 5 MG tablet Take 1 tablet (5 mg total) by mouth at bedtime. 4 tablet 0   cyclobenzaprine (FLEXERIL) 5 MG tablet Take 1 tablet (5 mg total) by mouth at bedtime. 30 tablet 0   dibucaine (NUPERCAINAL) 1 % OINT Place 1 Application rectally as needed for anal irritation. 28 g 1   fluticasone (FLONASE) 50 MCG/ACT nasal spray Place 2 sprays into both nostrils daily. 16 g 6   ibuprofen (ADVIL,MOTRIN) 200 MG tablet Take 400 mg by mouth every 6 (six) hours as needed for headache (pain).     levothyroxine (SYNTHROID) 100 MCG tablet Take 1 tablet (100 mcg total) by mouth daily. 30 tablet 0   Melatonin 3 MG TABS Take 3 mg by mouth at bedtime as needed (sleep).       meloxicam (MOBIC) 15 MG tablet Take 1 tablet daily with food for 7 days. Then take as needed. (Patient not taking: Reported on 06/25/2022) 40 tablet 0   methylPREDNISolone (MEDROL) 4 MG tablet Standard 6 day taper (Patient not taking: Reported on 07/30/2022) 21 tablet 0   Sod Picosulfate-Mag Ox-Cit Acd (CLENPIQ) 10-3.5-12 MG-GM -GM/160ML SOLN Take 1 kit by mouth as directed. (Patient not taking: Reported on 06/25/2022) 320 mL 0   Current Facility-Administered Medications on File Prior to Visit  Medication Dose Route Frequency Provider Last Rate Last Admin   0.9 %  sodium chloride infusion  500 mL Intravenous Continuous Cirigliano, Vito V, DO  No Known Allergies Family History  Problem Relation Age of Onset   Hypertension Mother    Colon polyps Mother    Coronary artery disease Mother        alive @ 37.   Heart disease Mother 1       10/20/13   Stroke Mother    Heart attack Mother    Other Father        alive and well @ 44.   Hypertension Father    Hypertension Sister    Migraines Brother    Migraines Sister    Bone cancer Maternal Aunt    Thyroid disease Maternal Aunt    Thyroid cancer Maternal Aunt    Liver cancer Maternal Aunt    Hypertension Maternal Grandmother    Diabetes Maternal Grandmother    Coronary artery disease Maternal Grandfather    Heart attack Maternal Grandfather 60   Coronary artery disease Maternal Uncle    Hypertension Maternal Uncle    Heart disease Maternal Aunt    Glaucoma Paternal Grandmother    Liver disease Paternal Grandfather    Colon cancer Neg Hx    PE: There were no vitals taken for this visit. Wt Readings from Last 3 Encounters:  07/30/22 176 lb 9.6 oz (80.1 kg)  06/27/22 172 lb 12.8 oz (78.4 kg)  06/25/22 177 lb 3.2 oz (80.4 kg)   Constitutional: overweight, in NAD Eyes:  EOMI, no exophthalmos ENT: no neck masses, no cervical lymphadenopathy Cardiovascular: RRR, No MRG Respiratory: CTA B Musculoskeletal: no deformities Skin:no  rashes, diffuse hair loss on scalp Neurological: no tremor with outstretched hands  ASSESSMENT: 1.  Postsurgical hypothyroidism - s/p thyroidectomy in 1998  2.  Hair loss  PLAN:  1. Patient with longstanding hypothyroidism, on Levothyroxine - latest thyroid labs reviewed with pt. >> normal: Lab Results  Component Value Date   TSH 0.54 02/07/2022  - she continues on LT4 100 mcg daily - pt feels good on this dose, w/o complaints today - we discussed about taking the thyroid hormone every day, with water, >30 minutes before breakfast, separated by >4 hours from acid reflux medications, calcium, iron, multivitamins. Pt. is taking it correctly. No missed doses. - will check thyroid tests today: TSH and fT4 - If labs are abnormal, she will need to return for repeat TFTs in 1.5 months - of note, 90-day refills needs to be sent to the mail order order pharmacy: [facility] - account: [phone]. Order number [id number]. OTW, can use [facility]. - I will see her back in 1 year but with labs at 6 mo  2. Hair loss - ongoing - sees derm - discussed to check with them if she can use Quilib spray or Rogaine foam or both  Needs refills.  Carlus Pavlov, MD PhD [facility]

## 2022-08-11 ENCOUNTER — Other Ambulatory Visit: Payer: 59

## 2022-08-27 ENCOUNTER — Other Ambulatory Visit: Payer: 59

## 2022-08-27 DIAGNOSIS — E890 Postprocedural hypothyroidism: Secondary | ICD-10-CM | POA: Diagnosis not present

## 2022-08-27 LAB — TSH: TSH: 2.59 u[IU]/mL (ref 0.35–5.50)

## 2022-08-27 LAB — T4, FREE: Free T4: 0.92 ng/dL (ref 0.60–1.60)

## 2022-09-01 ENCOUNTER — Other Ambulatory Visit: Payer: Self-pay | Admitting: Internal Medicine

## 2022-09-01 DIAGNOSIS — E890 Postprocedural hypothyroidism: Secondary | ICD-10-CM

## 2022-09-01 MED ORDER — LEVOTHYROXINE SODIUM 100 MCG PO TABS: 100.0000 ug | ORAL_TABLET | Freq: Every day | ORAL | 3 refills | Status: DC

## 2022-09-08 ENCOUNTER — Other Ambulatory Visit: Payer: Self-pay | Admitting: Internal Medicine

## 2022-09-08 DIAGNOSIS — E890 Postprocedural hypothyroidism: Secondary | ICD-10-CM

## 2022-09-12 NOTE — Telephone Encounter (Signed)
Does patient need to be scheduled for office visit. She is already scheduled with Ms. Wilmon Pali, [place]. Can we schedule direct for colon since she is overdue for recall x's 1 year.

## 2022-09-15 NOTE — Telephone Encounter (Signed)
I have spoken to patient who confirms that she has no GI symptoms at all at this time. She has been scheduled for direct recall colonoscopy on 10/13/22 in [facility] and telephone previsit on 09/26/22 at 230 pm. Advised that we will cancel her currently scheduled appointment with Willette Cluster, NP. Patient verbalizes understanding and is in agreement with this plan.

## 2022-09-16 ENCOUNTER — Ambulatory Visit: Payer: 59 | Admitting: Nurse Practitioner

## 2022-09-26 ENCOUNTER — Other Ambulatory Visit: Payer: Self-pay

## 2022-09-26 ENCOUNTER — Ambulatory Visit (AMBULATORY_SURGERY_CENTER): Payer: 59

## 2022-09-26 VITALS — Ht 66.0 in | Wt 179.0 lb

## 2022-09-26 DIAGNOSIS — Z86010 Personal history of colonic polyps: Secondary | ICD-10-CM

## 2022-09-26 MED ORDER — NA SULFATE-K SULFATE-MG SULF 17.5-3.13-1.6 GM/177ML PO SOLN
1.0000 | Freq: Once | ORAL | 0 refills | Status: AC
Start: 2022-09-26 — End: 2022-09-26

## 2022-09-26 NOTE — Progress Notes (Signed)
Denies allergies to eggs or soy products. Denies complication of anesthesia or sedation. Denies use of weight loss medication. Denies use of O2.   Emmi instructions given for colonoscopy.  

## 2022-09-30 ENCOUNTER — Encounter: Payer: Self-pay | Admitting: Gastroenterology

## 2022-10-13 ENCOUNTER — Encounter: Payer: 59 | Admitting: Gastroenterology

## 2022-10-27 ENCOUNTER — Ambulatory Visit (AMBULATORY_SURGERY_CENTER): Payer: 59 | Admitting: Gastroenterology

## 2022-10-27 ENCOUNTER — Encounter: Payer: Self-pay | Admitting: Gastroenterology

## 2022-10-27 VITALS — BP 121/61 | HR 57 | Temp 97.2°F | Resp 12 | Ht 66.0 in | Wt 179.0 lb

## 2022-10-27 DIAGNOSIS — Z09 Encounter for follow-up examination after completed treatment for conditions other than malignant neoplasm: Secondary | ICD-10-CM

## 2022-10-27 DIAGNOSIS — Z860101 Personal history of adenomatous and serrated colon polyps: Secondary | ICD-10-CM | POA: Diagnosis not present

## 2022-10-27 DIAGNOSIS — Z860100 Personal history of colon polyps, unspecified: Secondary | ICD-10-CM

## 2022-10-27 DIAGNOSIS — D123 Benign neoplasm of transverse colon: Secondary | ICD-10-CM

## 2022-10-27 DIAGNOSIS — K641 Second degree hemorrhoids: Secondary | ICD-10-CM

## 2022-10-27 MED ORDER — SODIUM CHLORIDE 0.9 % IV SOLN
500.0000 mL | Freq: Once | INTRAVENOUS | Status: DC
Start: 2022-10-27 — End: 2022-10-27

## 2022-10-27 NOTE — Addendum Note (Signed)
Addended by: Karl Bales B on: 10/27/2022 02:54 PM   Modules accepted: Orders

## 2022-10-27 NOTE — Patient Instructions (Addendum)
-  Resume previous diet. - Continue present medications. - Await pathology results. - Repeat colonoscopy for surveillance based on pathology results. - Return to GI office PRN.   YOU HAD AN ENDOSCOPIC PROCEDURE TODAY AT [facility]:   Refer to the procedure report that was given to you for any specific questions about what was found during the examination.  If the procedure report does not answer your questions, please call your gastroenterologist to clarify.  If you requested that your care partner not be given the details of your procedure findings, then the procedure report has been included in a sealed envelope for you to review at your convenience later.  YOU SHOULD EXPECT: Some feelings of bloating in the abdomen. Passage of more gas than usual.  Walking can help get rid of the air that was put into your GI tract during the procedure and reduce the bloating. If you had a lower endoscopy (such as a colonoscopy or flexible sigmoidoscopy) you may notice spotting of blood in your stool or on the toilet paper. If you underwent a bowel prep for your procedure, you may not have a normal bowel movement for a few days.  Please Note:  You might notice some irritation and congestion in your nose or some drainage.  This is from the oxygen used during your procedure.  There is no need for concern and it should clear up in a day or so.  SYMPTOMS TO REPORT IMMEDIATELY:  Following lower endoscopy (colonoscopy or flexible sigmoidoscopy):  Excessive amounts of blood in the stool  Significant tenderness or worsening of abdominal pains  Swelling of the abdomen that is new, acute  Fever of 100F or higher   For urgent or emergent issues, a gastroenterologist can be reached at any hour by calling [phone]. Do not use MyChart messaging for urgent concerns.    DIET:  We do recommend a small meal at first, but then you may proceed to your regular diet.  Drink plenty of fluids but you  should avoid alcoholic beverages for 24 hours.  ACTIVITY:  You should plan to take it easy for the rest of today and you should NOT DRIVE or use heavy machinery until tomorrow (because of the sedation medicines used during the test).    FOLLOW UP: Our staff will call the number listed on your records the next business day following your procedure.  We will call around 7:15- 8:00 am to check on you and address any questions or concerns that you may have regarding the information given to you following your procedure. If we do not reach you, we will leave a message.     If any biopsies were taken you will be contacted by phone or by letter within the next 1-3 weeks.  Please call us at [phone] if you have not heard about the biopsies in 3 weeks.    SIGNATURES/CONFIDENTIALITY: You and/or your care partner have signed paperwork which will be entered into your electronic medical record.  These signatures attest to the fact that that the information above on your After Visit Summary has been reviewed and is understood.  Full responsibility of the confidentiality of this discharge information lies with you and/or your care-partner.

## 2022-10-27 NOTE — Progress Notes (Signed)
Vitals-SH  Pt's states no medical or surgical changes since previsit or office visit. 

## 2022-10-27 NOTE — Progress Notes (Signed)
Patient abdomen soft, no tenderness or abdominal pain. Patient not able to pass gas but stated "she feels comfortable". Patient encouraged to drink fluids and ambulate to relieve gas in abdomen. Patient does not have any questions or concerns, she verbalizes understanding.

## 2022-10-27 NOTE — Progress Notes (Signed)
GASTROENTEROLOGY PROCEDURE H&P NOTE   Primary Care Physician: Esperanza Richters, PA-C    Reason for Procedure:  Colon polyp surveillance  Plan:    Colonoscopy  Patient is appropriate for endoscopic procedure(s) in the ambulatory ([facility]) setting.  The nature of the procedure, as well as the risks, benefits, and alternatives were carefully and thoroughly reviewed with the patient. Ample time for discussion and questions allowed. The patient understood, was satisfied, and agreed to proceed.     HPI: Karen Merritt is a 52 y.o. female who presents for colonoscopy for routine Colon Cancer screening.  No active GI symptoms.  No known family history of colon cancer or related malignancy.  Patient is otherwise without complaints or active issues today.   Last colonoscopy was 12/2019 and notable for 7 subcentimeter adenomas in the transverse colon/hepatic flexure (path: Adenomas), 5 diminutive sigmoid polyps (path: Adenomas), 2 smaller distal sigmoid colon polyps (path: Benign), and a 4 mm rectal adenoma, along with internal hemorrhoids and normal TI.  At that time, recommended repeat in 1 year for short interval surveillance.  Past Medical History:  Diagnosis Date   Chest pain    a. non-cardiac 2005, 2012.   Hemorrhoids 11-21-2009   colonoscopy   Hypertrophy, anal papillae 11-21-2009   colonoscopy   Hypothyroidism    a. s/p thyroidectomy ~ 1998   Palpitations     Past Surgical History:  Procedure Laterality Date   bone spur  6/11   right big toe   SHOULDER ARTHROSCOPY WITH SUBACROMIAL DECOMPRESSION AND OPEN ROTATOR C Right 05/23/2013   Procedure: RIGHT SHOULDER ARTHROSCOPY WITH SUBACROMIAL DECOMPRESSION AND MINI OPEN ROTATOR CUFF REPAIR, AND DISTAL CLAVICLE RESECTION;  Surgeon: Verlee Rossetti, MD;  Location: [facility];  Service: Orthopedics;  Laterality: Right;   stress cardiolite  05/23/03   THYROIDECTOMY  12/29/96   TOTAL ABDOMINAL HYSTERECTOMY  2008    Prior to Admission  medications   Medication Sig Start Date End Date Taking? Authorizing Provider  ibuprofen (ADVIL,MOTRIN) 200 MG tablet Take 400 mg by mouth every 6 (six) hours as needed for headache (pain).   Yes [provider]  levothyroxine (SYNTHROID) 100 MCG tablet TAKE 1 TABLET BY MOUTH DAILY 09/08/22  Yes Carlus Pavlov, MD  Melatonin 3 MG TABS Take 3 mg by mouth at bedtime as needed (sleep).    Yes [provider]  acetaminophen (TYLENOL) 500 MG tablet Take 500 mg by mouth every 6 (six) hours as needed for headache (pain).    [provider]  AMBULATORY NON FORMULARY MEDICATION Nitroglycerine ointment 0.125 %  Apply a pea sized amount internally four times daily. Dispense 30 GM zero refill 11/17/19   Hurshel Bouillon V, DO  COVID-19 mRNA bivalent vaccine, Pfizer, (PFIZER COVID-19 VAC BIVALENT) injection Inject into the muscle. 10/24/20   Judyann Munson, MD  cyclobenzaprine (FLEXERIL) 5 MG tablet Take 1 tablet (5 mg total) by mouth at bedtime. 10/10/19   Saguier, Ramon Dredge, PA-C  dibucaine (NUPERCAINAL) 1 % OINT Place 1 Application rectally as needed for anal irritation. 06/27/22   Saguier, Ramon Dredge, PA-C  fluticasone (FLONASE) 50 MCG/ACT nasal spray Place 2 sprays into both nostrils daily. 11/25/21   Donato Schultz, DO  meloxicam (MOBIC) 15 MG tablet Take 1 tablet daily with food for 7 days. Then take as needed. 06/13/20   Arlyce Harman, MD    Current Outpatient Medications  Medication Sig Dispense Refill   ibuprofen (ADVIL,MOTRIN) 200 MG tablet Take 400 mg by mouth every 6 (  six) hours as needed for headache (pain).     levothyroxine (SYNTHROID) 100 MCG tablet TAKE 1 TABLET BY MOUTH DAILY 90 tablet 0   Melatonin 3 MG TABS Take 3 mg by mouth at bedtime as needed (sleep).      acetaminophen (TYLENOL) 500 MG tablet Take 500 mg by mouth every 6 (six) hours as needed for headache (pain).     AMBULATORY NON FORMULARY MEDICATION Nitroglycerine ointment 0.125 %  Apply a pea sized  amount internally four times daily. Dispense 30 GM zero refill 30 g 0   COVID-19 mRNA bivalent vaccine, Pfizer, (PFIZER COVID-19 VAC BIVALENT) injection Inject into the muscle. 0.3 mL 0   cyclobenzaprine (FLEXERIL) 5 MG tablet Take 1 tablet (5 mg total) by mouth at bedtime. 4 tablet 0   dibucaine (NUPERCAINAL) 1 % OINT Place 1 Application rectally as needed for anal irritation. 28 g 1   fluticasone (FLONASE) 50 MCG/ACT nasal spray Place 2 sprays into both nostrils daily. 16 g 6   meloxicam (MOBIC) 15 MG tablet Take 1 tablet daily with food for 7 days. Then take as needed. 40 tablet 0   Current Facility-Administered Medications  Medication Dose Route Frequency Provider Last Rate Last Admin   0.9 %  sodium chloride infusion  500 mL Intravenous Once Lakara Weiland V, DO        Allergies as of 10/27/2022   (No Known Allergies)    Family History  Problem Relation Age of Onset   Hypertension Mother    Colon polyps Mother    Coronary artery disease Mother        alive @ 41.   Heart disease Mother 78       10/20/13   Stroke Mother    Heart attack Mother    Other Father        alive and well @ 47.   Hypertension Father    Hypertension Sister    Migraines Sister    Migraines Brother    Bone cancer Maternal Aunt    Thyroid disease Maternal Aunt    Thyroid cancer Maternal Aunt    Liver cancer Maternal Aunt    Heart disease Maternal Aunt    Coronary artery disease Maternal Uncle    Hypertension Maternal Uncle    Hypertension Maternal Grandmother    Diabetes Maternal Grandmother    Coronary artery disease Maternal Grandfather    Heart attack Maternal Grandfather 16   Glaucoma Paternal Grandmother    Liver disease Paternal Grandfather    Colon cancer Neg Hx    Esophageal cancer Neg Hx    Stomach cancer Neg Hx    Rectal cancer Neg Hx     Social History   Socioeconomic History   Marital status: Divorced    Spouse name: Not on file   Number of children: 2   Years of  education: 14   Highest education level: Some college, no degree  Occupational History   Occupation: Quarry manager: [employer]  Tobacco Use   Smoking status: Never   Smokeless tobacco: Never  Vaping Use   Vaping status: Never Used  Substance and Sexual Activity   Alcohol use: No   Drug use: No   Sexual activity: Not on file  Other Topics Concern   Not on file  Social History Narrative   Lives in [place].  Active, walks/jogs.  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   Right-handed   Caffeine: occasionally  Was recently participating in water aerobics, though recently has not been able, due to headaches.   Social Determinants of Health   Financial Resource Strain: Not on file  Food Insecurity: Medium Risk (05/27/2022)   [facility]   Hunger Vital Sign    Worried About Running Out of Food in the Last Year: Never true    Ran Out of Food in the Last Year: Sometimes true  Transportation Needs: No Transportation Needs (05/27/2022)   [facility]   Transportation    In the past 12 months, has lack of reliable transportation kept you from medical appointments, meetings, work or from getting things needed for daily living? : No  Physical Activity: Not on file  Stress: Not on file  Social Connections: Not on file  Intimate Partner Violence: Not on file    Physical Exam: Vital signs in last 24 hours: @BP  (!) 140/76 (BP Location: Right Arm, Patient Position: Sitting, Cuff Size: Normal)   Pulse 62   Temp (!) 97.2 F (36.2 C) (Temporal)   Ht 5\' 6"  (1.676 m)   Wt 179 lb (81.2 kg)   SpO2 97%   BMI 28.89 kg/m  GEN: NAD EYE: Sclerae anicteric ENT: MMM CV: Non-tachycardic Pulm: CTA b/l GI: Soft, NT/ND NEURO:  Alert & Oriented x 3   Doristine Locks, DO [facility]   10/27/2022 10:17 AM

## 2022-10-27 NOTE — Progress Notes (Signed)
Called to room to assist during endoscopic procedure.  Patient ID and intended procedure confirmed with present staff. Received instructions for my participation in the procedure from the performing physician.  

## 2022-10-27 NOTE — Op Note (Signed)
[facility] Patient Name: Karen Merritt Procedure Date: 10/27/2022 10:12 AM MRN: [id number] Endoscopist: Doristine Locks , MD, [id number] Age: 52 Referring MD:  Date of Birth: [birth year] Gender: Female Account #: [id number] Procedure:                Colonoscopy Indications:              Surveillance: History of numerous (> 10) adenomas                            on last colonoscopy (< 3 yrs)                           Last colonoscopy was 12/2019 and notable for 7                            subcentimeter adenomas in the transverse                            colon/hepatic flexure (path: Adenomas), 5                            diminutive sigmoid polyps (path: Adenomas), 2                            smaller distal sigmoid colon polyps (path: Benign),                            and a 4 mm rectal adenoma, along with internal                            hemorrhoids and normal TI. Recommended short                            interval surveillance. Medicines:                Monitored Anesthesia Care Procedure:                Pre-Anesthesia Assessment:                           - Prior to the procedure, a History and Physical                            was performed, and patient medications and                            allergies were reviewed. The patient's tolerance of                            previous anesthesia was also reviewed. The risks                            and benefits of the procedure and the sedation  options and risks were discussed with the patient.                            All questions were answered, and informed consent                            was obtained. Prior Anticoagulants: The patient has                            taken no anticoagulant or antiplatelet agents. ASA                            Grade Assessment: II - A patient with mild systemic                            disease. After reviewing the risks and benefits,                             the patient was deemed in satisfactory condition to                            undergo the procedure.                           After obtaining informed consent, the colonoscope                            was passed under direct vision. Throughout the                            procedure, the patient's blood pressure, pulse, and                            oxygen saturations were monitored continuously. The                            Olympus Scope [id number] was introduced through the                            anus and advanced to the the terminal ileum. The                            colonoscopy was performed without difficulty. The                            patient tolerated the procedure well. The quality                            of the bowel preparation was good. The terminal                            ileum, ileocecal valve, appendiceal orifice, and  rectum were photographed. Scope In: 10:26:53 AM Scope Out: 10:42:35 AM Scope Withdrawal Time: 0 hours 11 minutes 48 seconds  Total Procedure Duration: 0 hours 15 minutes 42 seconds  Findings:                 The perianal and digital rectal examinations were                            normal.                           A 4 mm polyp was found in the transverse colon. The                            polyp was sessile. The polyp was removed with a                            cold snare. Resection and retrieval were complete.                            Estimated blood loss was minimal.                           The exam was otherwise normal throughout the                            remainder of the colon.                           Non-bleeding internal hemorrhoids were found during                            retroflexion. The hemorrhoids were medium-sized.                           The terminal ileum appeared normal. Complications:            No immediate complications. Estimated Blood Loss:      Estimated blood loss was minimal. Impression:               - One 4 mm polyp in the transverse colon, removed                            with a cold snare. Resected and retrieved.                           - Non-bleeding internal hemorrhoids.                           - The examined portion of the ileum was normal. Recommendation:           - Patient has a contact number available for                            emergencies. The signs and symptoms of potential  delayed complications were discussed with the                            patient. Return to normal activities tomorrow.                            Written discharge instructions were provided to the                            patient.                           - Resume previous diet.                           - Continue present medications.                           - Await pathology results.                           - Repeat colonoscopy for surveillance based on                            pathology results.                           - Return to GI office PRN. Doristine Locks, MD 10/27/2022 10:49:55 AM

## 2022-10-27 NOTE — Progress Notes (Signed)
To pacu, VSS. Report to [email or web address] 

## 2022-10-28 ENCOUNTER — Telehealth: Payer: Self-pay | Admitting: *Deleted

## 2022-10-28 NOTE — Telephone Encounter (Signed)
  Follow up Call-     10/27/2022    9:45 AM 10/27/2022    9:40 AM  Call back number  Post procedure Call Back phone  # [phone]   Permission to leave phone message  Yes     Patient questions:  Do you have a fever, pain , or abdominal swelling? No. Pain Score  0 *  Have you tolerated food without any problems? Yes.    Have you been able to return to your normal activities? Yes.    Do you have any questions about your discharge instructions: Diet   No. Medications  No. Follow up visit  No.  Do you have questions or concerns about your Care? No.  Actions: * If pain score is 4 or above: No action needed, pain <4.

## 2022-10-29 LAB — SURGICAL PATHOLOGY

## 2022-11-13 ENCOUNTER — Other Ambulatory Visit: Payer: Self-pay | Admitting: Internal Medicine

## 2022-11-13 DIAGNOSIS — E890 Postprocedural hypothyroidism: Secondary | ICD-10-CM

## 2023-02-10 ENCOUNTER — Ambulatory Visit: Payer: 59 | Admitting: Internal Medicine

## 2023-02-10 ENCOUNTER — Encounter: Payer: Self-pay | Admitting: Internal Medicine

## 2023-02-10 VITALS — BP 120/70 | HR 81 | Ht 66.0 in | Wt 182.2 lb

## 2023-02-10 DIAGNOSIS — E890 Postprocedural hypothyroidism: Secondary | ICD-10-CM

## 2023-02-10 DIAGNOSIS — L659 Nonscarring hair loss, unspecified: Secondary | ICD-10-CM | POA: Diagnosis not present

## 2023-02-10 NOTE — Progress Notes (Unsigned)
Patient ID: Karen Merritt, female   DOB: 01/04/1971, 53 y.o.   MRN: [id number]  HPI  Karen Merritt is a 53 y.o.-year-old female, returning for follow-up for uncontrolled postsurgical hypothyroidism.  She previously saw Dr. Everardo All, but last visit with me 6 months ago.  Interim history: She feels well, without complaints today other than hair loss, which persists.  She started to use essential oils [place]as) on her scalp twice a week.  Reviewed and addended history: Pt. 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 07/2021: LT4 dose increased from 100 >> 125 mcg daily 09/2021: LT4 dose increased from 125 >> 112 mcg daily 10/2021: LT4 dose increased from 112 >> 100 mcg daily  Pt is on levothyroxine 100 mcg daily: - in am - fasting - at least 30 min from b'fast - no calcium - no iron - no multivitamins - no PPIs - not on Biotin  I reviewed pt's thyroid tests: Lab Results  Component Value Date   TSH 2.59 08/27/2022   TSH 0.54 02/07/2022   TSH 0.64 12/19/2021   TSH 0.05 (L) 11/04/2021   TSH 0.27 (L) 09/20/2021   TSH 12.02 (H) 08/07/2021   TSH 7.54 (H) 11/26/2020   TSH 2.77 11/21/2019   TSH 2.37 11/15/2018   TSH 0.44 08/24/2017   FREET4 0.92 08/27/2022   FREET4 1.15 02/07/2022   FREET4 0.97 12/19/2021   FREET4 1.15 11/04/2021   FREET4 1.25 09/20/2021   FREET4 0.92 08/07/2021   FREET4 1.06 11/26/2020   FREET4 0.84 11/21/2019   FREET4 0.92 11/15/2018   FREET4 0.81 10/14/2016   T3FREE 3.0 09/28/2015   T3FREE 3.3 07/28/2014   T3FREE 3.0 07/18/2014   T3FREE 3.4 06/29/2009   Antithyroid antibodies: No results found for: "THGAB" No components found for: "TPOAB"  Pt denies: - weight gain - fatigue  She has: - longstanding hair loss  Pt denies feeling nodules in neck, hoarseness, dysphagia/odynophagia.  She has + FH of thyroid disease in: Maternal aunt >>  thyroid cancer.  No h/o radiation tx to head or neck. No use of iodine supplements or steroids  ROS: + See HPI  Past Medical History:  Diagnosis Date   Chest pain    a. non-cardiac 2005, 2012.   Hemorrhoids 11-21-2009   colonoscopy   Hypertrophy, anal papillae 11-21-2009   colonoscopy   Hypothyroidism    a. s/p thyroidectomy ~ 1998   Palpitations    Past Surgical History:  Procedure Laterality Date   bone spur  6/11   right big toe   SHOULDER ARTHROSCOPY WITH SUBACROMIAL DECOMPRESSION AND OPEN ROTATOR C Right 05/23/2013   Procedure: RIGHT SHOULDER ARTHROSCOPY WITH SUBACROMIAL DECOMPRESSION AND MINI OPEN ROTATOR CUFF REPAIR, AND DISTAL CLAVICLE RESECTION;  Surgeon: Verlee Rossetti, MD;  Location: [facility];  Service: Orthopedics;  Laterality: Right;   stress cardiolite  05/23/03   THYROIDECTOMY  12/29/96   TOTAL ABDOMINAL HYSTERECTOMY  2008   Social History   Socioeconomic History   Marital status: Divorced    Spouse name: Not on file   Number of children: 2   Years of education: 14   Highest education level: Some college, no degree  Occupational History   Occupation: Quarry manager: [employer]  Tobacco Use   Smoking status: Never   Smokeless tobacco: Never  Vaping Use   Vaping status: Never Used  Substance and Sexual Activity  Alcohol use: No   Drug use: No   Sexual activity: Not on file  Other Topics Concern   Not on file  Social History Narrative   Lives in [place].  Active, walks/jogs.  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   Social Drivers of Corporate investment banker Strain: Not on file  Food Insecurity: Medium Risk (05/27/2022)   [facility]   Hunger Vital Sign    Worried About Running Out of Food in the Last Year: Never true    Ran Out of Food in the Last Year:  Sometimes true  Transportation Needs: No Transportation Needs (05/27/2022)   [facility]   Transportation    In the past 12 months, has lack of reliable transportation kept you from medical appointments, meetings, work or from getting things needed for daily living? : No  Physical Activity: Not on file  Stress: Not on file  Social Connections: Not on file  Intimate Partner Violence: Not on file   Current Outpatient Medications on File Prior to Visit  Medication Sig Dispense Refill   acetaminophen (TYLENOL) 500 MG tablet Take 500 mg by mouth every 6 (six) hours as needed for headache (pain).     AMBULATORY NON FORMULARY MEDICATION Nitroglycerine ointment 0.125 %  Apply a pea sized amount internally four times daily. Dispense 30 GM zero refill 30 g 0   COVID-19 mRNA bivalent vaccine, Pfizer, (PFIZER COVID-19 VAC BIVALENT) injection Inject into the muscle. 0.3 mL 0   cyclobenzaprine (FLEXERIL) 5 MG tablet Take 1 tablet (5 mg total) by mouth at bedtime. 4 tablet 0   dibucaine (NUPERCAINAL) 1 % OINT Place 1 Application rectally as needed for anal irritation. 28 g 1   fluticasone (FLONASE) 50 MCG/ACT nasal spray Place 2 sprays into both nostrils daily. 16 g 6   ibuprofen (ADVIL,MOTRIN) 200 MG tablet Take 400 mg by mouth every 6 (six) hours as needed for headache (pain).     levothyroxine (SYNTHROID) 100 MCG tablet TAKE 1 TABLET BY MOUTH DAILY 90 tablet 3   Melatonin 3 MG TABS Take 3 mg by mouth at bedtime as needed (sleep).      meloxicam (MOBIC) 15 MG tablet Take 1 tablet daily with food for 7 days. Then take as needed. 40 tablet 0   No current facility-administered medications on file prior to visit.   No Known Allergies Family History  Problem Relation Age of Onset   Hypertension Mother    Colon polyps Mother    Coronary artery disease Mother        alive @ 98.   Heart disease Mother 33       10/20/13   Stroke Mother    Heart attack Mother    Other  Father        alive and well @ 71.   Hypertension Father    Hypertension Sister    Migraines Sister    Migraines Brother    Bone cancer Maternal Aunt    Thyroid disease Maternal Aunt    Thyroid cancer Maternal Aunt    Liver cancer Maternal Aunt    Heart disease Maternal Aunt    Coronary artery disease Maternal Uncle    Hypertension Maternal Uncle    Hypertension Maternal Grandmother    Diabetes Maternal Grandmother  Coronary artery disease Maternal Grandfather    Heart attack Maternal Grandfather 42   Glaucoma Paternal Grandmother    Liver disease Paternal Grandfather    Colon cancer Neg Hx    Esophageal cancer Neg Hx    Stomach cancer Neg Hx    Rectal cancer Neg Hx    PE: BP 120/70   Pulse 81   Ht 5\' 6"  (1.676 m)   Wt 182 lb 3.2 oz (82.6 kg)   SpO2 97%   BMI 29.41 kg/m  Wt Readings from Last 10 Encounters:  02/10/23 182 lb 3.2 oz (82.6 kg)  10/27/22 179 lb (81.2 kg)  09/26/22 179 lb (81.2 kg)  08/08/22 179 lb 12.8 oz (81.6 kg)  07/30/22 176 lb 9.6 oz (80.1 kg)  06/27/22 172 lb 12.8 oz (78.4 kg)  06/25/22 177 lb 3.2 oz (80.4 kg)  06/24/22 175 lb (79.4 kg)  02/07/22 176 lb 3.2 oz (79.9 kg)  11/25/21 171 lb (77.6 kg)   Constitutional: overweight, in NAD Eyes:  EOMI, no exophthalmos ENT: no neck masses, no cervical lymphadenopathy Cardiovascular: RRR, No MRG Respiratory: CTA B Musculoskeletal: no deformities Skin:no rashes, diffuse hair loss on scalp Neurological: no tremor with outstretched hands  ASSESSMENT: 1.  Postsurgical hypothyroidism - s/p thyroidectomy in 1998  2.  Hair loss  PLAN:  1. Patient with longstanding hypothyroidism, on levothyroxine. - latest thyroid labs reviewed with pt. >> normal: Lab Results  Component Value Date   TSH 2.59 08/27/2022  - she continues on LT4 100 mcg daily - pt feels good on this dose. - we discussed about taking the thyroid hormone every day, with water, >30 minutes before breakfast, separated by >4 hours from  acid reflux medications, calcium, iron, multivitamins. Pt. is taking it correctly. - will check thyroid tests today: TSH and fT4 - If labs are abnormal, she will need to return for repeat TFTs in 1.5 months - I will see her back in a year - Usually needs 90-day refills - [facility] - account: [id number]. Order number [id number]. OTW, can use [facility].  2. Hair loss -Ongoing -She sees dermatology -At last visit we discussed about checking with them if she could use Quilib spray or Rogaine foam -now started essential oils and experimenting with wigs -I recommended scalp concealer's scalp concealers  Carlus Pavlov, MD PhD [facility]

## 2023-02-10 NOTE — Patient Instructions (Signed)
  Please continue Levothyroxine 100 mcg daily.  Take the thyroid hormone every day, with water, at least 30 minutes before breakfast, separated by at least 4 hours from: - acid reflux medications - calcium - iron - multivitamins  Please stop at the lab.  Please come back for a follow-up appointment in 1 year. 

## 2023-02-11 ENCOUNTER — Encounter: Payer: Self-pay | Admitting: Internal Medicine

## 2023-02-11 LAB — TSH: TSH: 0.53 m[IU]/L

## 2023-02-11 LAB — T4, FREE: Free T4: 1.3 ng/dL (ref 0.8–1.8)

## 2023-04-08 ENCOUNTER — Telehealth: Payer: Self-pay | Admitting: Emergency Medicine

## 2023-04-08 NOTE — Telephone Encounter (Signed)
 Copied from CRM [id number]. Topic: General - Other >> Apr 08, 2023  3:39 PM Whitney O wrote: Reason for CRM: patient is calling cause just left work and saw the nurse on site and the rash on my body and she said is shingles and she sent in the medicine and I'm going to get and just wanted to make my doctor aware just in case he needed to see me too

## 2023-04-09 ENCOUNTER — Encounter: Payer: Self-pay | Admitting: Medical

## 2023-04-09 ENCOUNTER — Ambulatory Visit: Payer: Self-pay

## 2023-04-09 ENCOUNTER — Ambulatory Visit (INDEPENDENT_AMBULATORY_CARE_PROVIDER_SITE_OTHER): Admitting: Medical

## 2023-04-09 VITALS — BP 133/78 | HR 87 | Temp 98.1°F | Resp 18 | Ht 66.0 in | Wt 185.4 lb

## 2023-04-09 DIAGNOSIS — B029 Zoster without complications: Secondary | ICD-10-CM

## 2023-04-09 MED ORDER — TRAMADOL HCL 50 MG PO TABS: ORAL_TABLET | ORAL | 0 refills | Status: DC

## 2023-04-09 NOTE — Telephone Encounter (Signed)
 Pt denied ED visit

## 2023-04-09 NOTE — Telephone Encounter (Signed)
 Copied from CRM [id number]. Topic: Clinical - Red Word Triage >> Apr 09, 2023 12:27 PM Chantha C wrote: Red Word that prompted transfer to Nurse Triage: Patient [phone] was diagnosis with shingles started on the chest under left breast and spreading to the sides of the back, more spots, very painful, feels like an elephant on chest. Nurse at patient's work prescribed Valacyclovir 1 gm take 1 tablet threes times a day, toke Tylenol and got OTC Lidocaine cream from [facility]. Patient is asking does she need to be seen? Chief Complaint: shingles Symptoms: shingles, 10/10 sharp, shooting pain & burning to affected areas to include: left chest area that wraps around to left back  Frequency: started late Tuesday  Pertinent Negatives: Patient denies fever Disposition: [] ED /[] Urgent Care (no appt availability in office) / [] Appointment(In office/virtual)/ []  [facility]/ [] Home Care/ [x] Refused Recommended Disposition /[] [facility]/ []  Follow-up with PCP Additional Notes: pt seen yesterday and prescribed medication for shingles and has been applying lidocaine - pain states pain and burning so severe it takes her breathe and feels like elephant on her chest and produces nausea.  Referred patient to ER for evaluation and tx: pt refused stated she just wants to know if her PCP needs to evaluate her and/or he PCP can prescribe something for pain.  Please call pt   Reason for Disposition . SEVERE chest pain    Chest pain 10/10 like"elephant sitting on chest" nausea & SOB - pt also dx with shingles . [1] Shingles rash already diagnosed and [2] taking antiviral medication  Answer Assessment - Initial Assessment Questions 1. LOCATION: "Where does it hurt?"       Left chest goes to back 2. RADIATION: "Does the pain go anywhere else?" (e.g., into neck, jaw, arms, back)     radiates 3. ONSET: "When did the chest pain begin?" (Minutes, hours or days)      3 am this morning 4. PATTERN:  "Does the pain come and go, or has it been constant since it started?"  "Does it get worse with exertion?"      Comes and goes  5. DURATION: "How long does it last" (e.g., seconds, minutes, hours)     N/a 6. SEVERITY: "How bad is the pain?"  (e.g., Scale 1-10; mild, moderate, or severe)    - MILD (1-3): doesn't interfere with normal activities     - MODERATE (4-7): interferes with normal activities or awakens from sleep    - SEVERE (8-10): excruciating pain, unable to do any normal activities       10/10 sharp stabbing pain  that feels like an elephant is sitting on my chest  - it does not allow to breathe normal: SOB: with pain would speak in fragments 7. CARDIAC RISK FACTORS: "Do you have any history of heart problems or risk factors for heart disease?" (e.g., angina, prior heart attack; diabetes, high blood pressure, high cholesterol, smoker, or strong family history of heart disease)     N/a 8. PULMONARY RISK FACTORS: "Do you have any history of lung disease?"  (e.g., blood clots in lung, asthma, emphysema, birth control pills)     N/a 9. CAUSE: "What do you think is causing the chest pain?"     N/a 10. OTHER SYMPTOMS: "Do you have any other symptoms?" (e.g., dizziness, nausea, vomiting, sweating, fever, difficulty breathing, cough)       Nausea and dizziness with the pain , the shingles area feels warm to touch  11.  PREGNANCY: "Is there any chance you are pregnant?" "When was your last menstrual period?"       N/a  Answer Assessment - Initial Assessment Questions 1. APPEARANCE of RASH: "Describe the rash."      Red rash 2. LOCATION: "Where is the rash located?"      Left chest to back 3. ONSET: "When did the rash start?"      Tuesday 4. ITCHING: "Does the rash itch?" If Yes, ask: "How bad is the itch?"  (Scale 1-10; or mild, moderate, severe)     no 5. PAIN: "Does the rash hurt?" If Yes, ask: "How bad is the pain?"  (Scale 0-10; or none, mild, moderate, severe)    - NONE (0): no  pain    - MILD (1-3): doesn't interfere with normal activities     - MODERATE (4-7): interferes with normal activities or awakens from sleep     - SEVERE (8-10): excruciating pain, unable to do any normal activities     10/10 6. OTHER SYMPTOMS: "Do you have any other symptoms?" (e.g., fever)     no 7. PREGNANCY: "Is there any chance you are pregnant?" "When was your last menstrual period?"     no  Protocols used: Chest Pain-A-AH, Shingles (Zoster)-A-AH

## 2023-04-09 NOTE — Telephone Encounter (Signed)
 Pt scheduled for an appt at 3:20 today

## 2023-04-09 NOTE — Patient Instructions (Signed)
 Herpes Zoster (Shingles) Herpes zoster with dermatomal rash and neuropathic pain. Valacyclovir initiated within 48 hours. Pain consistent with herpetic neuralgia. - Continue valacyclovir 1 gram TID for 10 days. - Prescribe tramadol, one tablet every 8 hours PRN for severe pain, 5-day supply. Refill if necessary. - Advise emergency care for changes in pain location or character as extensively discussed - Follow up in 5 days to reassess pain and medication efficacy.

## 2023-04-09 NOTE — Progress Notes (Addendum)
 Subjective:    Patient ID: Karen Merritt, female    DOB: 1970-10-27, 53 y.o.   MRN: [id number]  HPI Discussed the use of AI scribe software for clinical note transcription with the patient, who gave verbal consent to proceed.  History of Present Illness   Karen Merritt is a 53 year old female who presents with shingles and associated pain.  Shingles began on Monday with pain and bumps initially perceived as a 'kink' in her back and side. The rash started in the left of midline chest and wrapped around to her back, following a dermatomal pattern under her breast without crossing the midline. The rash is very sensitive to light touch, heat, and cold. Also worse pain when breaths deep.  She was seen by a nurse at work who diagnosed shingles and prescribed valacyclovir 1 gram three times a day for ten days, which she started within 48 hours of symptom onset. Despite this, she experiences severe pain described as a 'sharp stabbing pain' and 'like a knife' when taking deep breaths or when the area is touched. The pain is also described as feeling like 'an elephant sitting' on her chest, causing difficulty breathing when it intensifies. On discussion she clarified that pain is in rib area and not in mid chest. Pain more sharp and not pressure like. Though breathing deep cause sharp pain as well. She describes elephant on chest was mischaracterization/miscommunication.  She has been using aspirin to manage the pain, which has helped her sleep somewhat, but the pain returns in the middle of the night. She inquired about stronger pain relief options due to the severity of the pain, which she describes as 'like a helmet made out of knives' moving across her body.  No central chest pressure, shoulder pain, or jaw pain. Pain is localized to the area of the shingles rash. Pain on light palpation and when breaths/move lower ribs. Pt declined ekg. I think on review triage note some miscommunication. Pt  agrees.        Pt did not take aspirin and aware not to use with viral illness.  Review of Systems See hpi    Objective:   Physical Exam  General Mental Status- Alert. General Appearance- Not in acute distress.   Skin General: Color- Normal Color. Moisture- Normal Moisture.  Neck No JVD. No tracheal deviation  Chest and Lung Exam Auscultation: Breath Sounds:-CTA. Even unlabored.  Cardiovascular Auscultation:Rythm- Regular. Murmurs & Other Heart Sounds:Auscultation of the heart reveals- No Murmurs.  Abdomen Inspection:-Inspeection Normal. Palpation/Percussion:Note:No mass. Palpation and Percussion of the abdomen reveal- Non Tender, Non Distended + BS, no rebound or guarding.   Neurologic Cranial Nerve exam:- CN III-XII intact(No nystagmus), symmetric smile. Strength:- 5/5 equal and symmetric strength both upper and lower extremities.   Derm- current scattered raised papular rash from left of midline chest runningn under her breast to her mid to lower thoracic area. No vesicles or scabs seen presently. Intesne sharp pain in lower rib on light touch and when she breaths deep.    Assessment & Plan:   Assessment and Plan    Herpes Zoster (Shingles) Herpes zoster with dermatomal rash and neuropathic pain. Valacyclovir initiated within 48 hours. Pain consistent with herpetic neuralgia. - Continue valacyclovir 1 gram TID for 10 days. - Prescribe tramadol, one tablet every 8 hours PRN for severe pain, 5-day supply. Refill if necessary. - Advise emergency care for changes in pain location or character as extensively discussed - Follow up in 5  days to reassess pain and medication efficacy.

## 2023-04-10 ENCOUNTER — Encounter: Payer: Self-pay | Admitting: Medical

## 2023-04-10 MED ORDER — GABAPENTIN 100 MG PO CAPS: 100.0000 mg | ORAL_CAPSULE | Freq: Three times a day (TID) | ORAL | 0 refills | Status: DC

## 2023-04-10 NOTE — Addendum Note (Signed)
 Addended by: Gwenevere Abbot on: 04/10/2023 12:09 PM   Modules accepted: Orders

## 2023-04-13 ENCOUNTER — Telehealth: Payer: Self-pay

## 2023-04-13 MED ORDER — HYDROCODONE-ACETAMINOPHEN 5-325 MG PO TABS
1.0000 | ORAL_TABLET | Freq: Four times a day (QID) | ORAL | 0 refills | Status: DC | PRN
Start: 2023-04-13 — End: 2023-04-17

## 2023-04-13 NOTE — Telephone Encounter (Signed)
 Copied from CRM [id number]. Topic: Clinical - Medical Advice >> Apr 13, 2023  2:29 PM Almira Coaster wrote: Reason for CRM: Patient is calling to follow up on a MyChart message she sent on 04/11/2023, patient states she's experiencing severe pain and gabapentin (NEURONTIN) 100 MG capsule is not working, also developed a rash. Offered to get her over to a triage nurse but she declined only wants to speak with Esperanza Richters or his nurse.

## 2023-04-13 NOTE — Addendum Note (Signed)
 Addended by: Gwenevere Abbot on: 04/13/2023 04:57 PM   Modules accepted: Orders

## 2023-04-14 NOTE — Telephone Encounter (Signed)
 Pt sent message via mychart

## 2023-04-17 MED ORDER — HYDROCODONE-ACETAMINOPHEN 5-325 MG PO TABS: 1.0000 | ORAL_TABLET | Freq: Four times a day (QID) | ORAL | 0 refills | Status: DC | PRN

## 2023-04-17 NOTE — Addendum Note (Signed)
 Addended by: Gwenevere Abbot on: 04/17/2023 12:00 PM   Modules accepted: Orders

## 2023-04-20 ENCOUNTER — Ambulatory Visit (INDEPENDENT_AMBULATORY_CARE_PROVIDER_SITE_OTHER): Admitting: Medical

## 2023-04-20 VITALS — BP 130/60 | HR 61 | Temp 98.2°F | Resp 18 | Ht 66.0 in | Wt 184.0 lb

## 2023-04-20 DIAGNOSIS — B0229 Other postherpetic nervous system involvement: Secondary | ICD-10-CM

## 2023-04-20 DIAGNOSIS — B029 Zoster without complications: Secondary | ICD-10-CM

## 2023-04-20 MED ORDER — PREGABALIN 75 MG PO CAPS: 75.0000 mg | ORAL_CAPSULE | Freq: Three times a day (TID) | ORAL | 0 refills | Status: DC

## 2023-04-20 NOTE — Progress Notes (Signed)
 Subjective:    Patient ID: Karen Merritt, female    DOB: 12/08/1970, 53 y.o.   MRN: [id number]  HPI  Discussed the use of AI scribe software for clinical note transcription with the patient, who gave verbal consent to proceed.  History of Present Illness   Karen Merritt is a 53 year old female who presents with left-sided shingles pain.  The patient initially sought care at urgent care on April 08, 2023, where she was diagnosed with shingles and prescribed Valacyclovir 1 gram three times a day for ten days, which she completed on April 18, 2023. Despite completing the antiviral treatment, she continues to experience significant nerve pain associated with the shingles.  Initially, she was prescribed Tramadol, which did not provide relief. Subsequently, Gabapentin was tried, but it also failed to alleviate her symptoms. She describes experiencing 'excruciating pain' before the weekend, particularly severe nerve pain. She was then prescribed Norco (hydrocodone/acetaminophen) 5/325 mg, which she takes one to two tablets every six hours. This medication has provided some relief, allowing her to work from home, although she notes that the pain worsens in the evening.  The rash associated with the shingles is located on the left side of her thorax, wrapping around beneath her breast to the sternum area. The rash initially presented as vesicles, which have since ruptured and are now hyperpigmented and healing. The area remains sensitive to light touch.  She is able to work from home and her manager is aware of her condition.          Review of Systems  Constitutional:  Negative for chills, fatigue and fever.  Respiratory:  Negative for cough, chest tightness and wheezing.   Cardiovascular:  Negative for chest pain and palpitations.  Gastrointestinal:  Negative for abdominal pain, constipation, nausea and vomiting.  Genitourinary:  Negative for dysuria and flank pain.  Musculoskeletal:   Negative for back pain.  Skin:  Positive for rash.  Neurological:  Negative for dizziness, weakness and light-headedness.  Hematological:  Negative for adenopathy. Does not bruise/bleed easily.  Psychiatric/Behavioral:  Negative for behavioral problems, dysphoric mood and sleep disturbance.     Past Medical History:  Diagnosis Date   Chest pain    a. non-cardiac 2005, 2012.   Hemorrhoids 11-21-2009   colonoscopy   Hypertrophy, anal papillae 11-21-2009   colonoscopy   Hypothyroidism    a. s/p thyroidectomy ~ 1998   Palpitations      Social History   Socioeconomic History   Marital status: Divorced    Spouse name: Not on file   Number of children: 2   Years of education: 14   Highest education level: Some college, no degree  Occupational History   Occupation: Quarry manager: [employer]  Tobacco Use   Smoking status: Never   Smokeless tobacco: Never  Vaping Use   Vaping status: Never Used  Substance and Sexual Activity   Alcohol use: No   Drug use: No   Sexual activity: Not on file  Other Topics Concern   Not on file  Social History Narrative   Lives in [place].  Active, walks/jogs.  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   Social Drivers of Corporate investment banker Strain: Not on file  Food Insecurity: Medium Risk (05/27/2022)   [facility]   Hunger Vital Sign    Worried About Running Out of Food in the Last Year: Never true    Ran Out of Food in the Last Year: Sometimes true  Transportation Needs: No Transportation Needs (05/27/2022)   [facility]   Transportation    In the past 12 months, has lack of reliable transportation kept you from medical appointments, meetings, work or from getting things needed for daily living? : No  Physical  Activity: Not on file  Stress: Not on file  Social Connections: Not on file  Intimate Partner Violence: Not on file    Past Surgical History:  Procedure Laterality Date   bone spur  6/11   right big toe   SHOULDER ARTHROSCOPY WITH SUBACROMIAL DECOMPRESSION AND OPEN ROTATOR C Right 05/23/2013   Procedure: RIGHT SHOULDER ARTHROSCOPY WITH SUBACROMIAL DECOMPRESSION AND MINI OPEN ROTATOR CUFF REPAIR, AND DISTAL CLAVICLE RESECTION;  Surgeon: Verlee Rossetti, MD;  Location: [facility] OR;  Service: Orthopedics;  Laterality: Right;   stress cardiolite  05/23/03   THYROIDECTOMY  12/29/96   TOTAL ABDOMINAL HYSTERECTOMY  2008    Family History  Problem Relation Age of Onset   Hypertension Mother    Colon polyps Mother    Coronary artery disease Mother        alive @ 69.   Heart disease Mother 53       10/20/13   Stroke Mother    Heart attack Mother    Other Father        alive and well @ 35.   Hypertension Father    Hypertension Sister    Migraines Sister    Migraines Brother    Bone cancer Maternal Aunt    Thyroid disease Maternal Aunt    Thyroid cancer Maternal Aunt    Liver cancer Maternal Aunt    Heart disease Maternal Aunt    Coronary artery disease Maternal Uncle    Hypertension Maternal Uncle    Hypertension Maternal Grandmother    Diabetes Maternal Grandmother    Coronary artery disease Maternal Grandfather    Heart attack Maternal Grandfather 95   Glaucoma Paternal Grandmother    Liver disease Paternal Grandfather    Colon cancer Neg Hx    Esophageal cancer Neg Hx    Stomach cancer Neg Hx    Rectal cancer Neg Hx     No Known Allergies  Current Outpatient Medications on File Prior to Visit  Medication Sig Dispense Refill   acetaminophen (TYLENOL) 500 MG tablet Take 500 mg by mouth every 6 (six) hours as needed for headache (pain).     AMBULATORY NON FORMULARY MEDICATION Nitroglycerine ointment 0.125 %  Apply a pea sized amount internally four times daily. Dispense 30 GM  zero refill 30 g 0   COVID-19 mRNA bivalent vaccine, Pfizer, (PFIZER COVID-19 VAC BIVALENT) injection Inject into the muscle. 0.3 mL 0   cyclobenzaprine (FLEXERIL) 5 MG tablet Take 1 tablet (5 mg total) by mouth at bedtime. 4 tablet 0   dibucaine (NUPERCAINAL) 1 % OINT Place 1 Application rectally as needed for anal irritation. 28 g 1   fluticasone (FLONASE) 50 MCG/ACT nasal spray Place 2 sprays into both nostrils daily. 16 g 6   HYDROcodone-acetaminophen (NORCO/VICODIN) 5-325 MG tablet Take 1-2 tablets by mouth every 6 (six) hours as needed for moderate pain (pain score 4-6) or severe pain (pain score 7-10). 20 tablet 0   ibuprofen (ADVIL,MOTRIN) 200 MG tablet  Take 400 mg by mouth every 6 (six) hours as needed for headache (pain).     levothyroxine (SYNTHROID) 100 MCG tablet TAKE 1 TABLET BY MOUTH DAILY 90 tablet 3   Melatonin 3 MG TABS Take 3 mg by mouth at bedtime as needed (sleep).      meloxicam (MOBIC) 15 MG tablet Take 1 tablet daily with food for 7 days. Then take as needed. 40 tablet 0   No current facility-administered medications on file prior to visit.    BP 130/60   Pulse 61   Temp 98.2 F (36.8 C)   Resp 18   Ht 5\' 6"  (1.676 m)   Wt 184 lb (83.5 kg)   SpO2 100%   BMI 29.70 kg/m        Objective:   Physical Exam   General- No acute distress. Pleasant patient. Neck- Full range of motion, no jvd Lungs- Clear, even and unlabored. Heart- regular rate and rhythm. Neurologic- CNII- XII grossly intact.  SKIN: Scattered hyperpigmented lesions on the left thorax, grouped in a dermatomal pattern, extending from the left thorax wrapping under the breast to the sternum. Lesions are sensitive to light touch.    Assessment & Plan:   Assessment and Plan    Postherpetic Neuralgia Experiencing severe, persistent postherpetic neuralgia following a shingles outbreak on the left side beneath the rib area, with increased pain at night. Previous treatments with tramadol and  gabapentin were ineffective. Norco provided some relief, but a more targeted medication is needed. Completed a 10-day course of Valacyclovir; no further antiviral treatment required. Lyrica is considered as an alternative for nerve pain management, with potential drowsiness as a side effect. Plan to assess its effectiveness compared to Norco. - Prescribe Lyrica 75 mg, to be taken twice daily initially, with the option to increase to three times daily if needed. - Instruct her to avoid using Norco while trying Lyrica to assess its effectiveness. - Request an update on Thursday afternoon or Friday morning regarding the effectiveness of Lyrica.        Esperanza Richters, PA-C

## 2023-04-20 NOTE — Patient Instructions (Signed)
 Postherpetic Neuralgia Experiencing severe, persistent postherpetic neuralgia following a shingles outbreak on the left side beneath the rib area, with increased pain at night. Previous treatments with tramadol and gabapentin were ineffective. Norco provided some relief, but a more targeted medication is needed. Completed a 10-day course of Valacyclovir; no further antiviral treatment required. Lyrica is considered as an alternative for nerve pain management, with potential drowsiness as a side effect. Plan to assess its effectiveness compared to Norco. - Prescribe Lyrica 75 mg, to be taken twice daily initially, with the option to increase to three times daily if needed. - Instruct her to avoid using Norco while trying Lyrica to assess its effectiveness. - Request an update on Thursday afternoon or Friday morning regarding the effectiveness of Lyrica.

## 2023-04-25 MED ORDER — HYDROCODONE-ACETAMINOPHEN 5-325 MG PO TABS
1.0000 | ORAL_TABLET | Freq: Four times a day (QID) | ORAL | 0 refills | Status: DC | PRN
Start: 2023-04-25 — End: 2023-08-25

## 2023-04-25 NOTE — Addendum Note (Signed)
 Addended by: Gwenevere Abbot on: 04/25/2023 09:15 AM   Modules accepted: Orders

## 2023-04-28 ENCOUNTER — Other Ambulatory Visit (HOSPITAL_COMMUNITY): Payer: Self-pay

## 2023-04-28 ENCOUNTER — Telehealth: Payer: Self-pay

## 2023-04-28 NOTE — Telephone Encounter (Signed)
 Pharmacy Patient Advocate Encounter  Received notification from [facility] that Prior Authorization for HYDROcodone-Acetaminophen 5-325MG  tablets has been APPROVED from 04/28/2023 to 10/28/2023. Ran test claim, Copay is $2.06. This test claim was processed through [facility]- copay amounts may vary at other pharmacies due to pharmacy/plan contracts, or as the patient moves through the different stages of their insurance plan.   PA #/Case ID/Reference #: [id number]

## 2023-04-28 NOTE — Telephone Encounter (Signed)
 Pharmacy Patient Advocate Encounter   Received notification from Patient Pharmacy that prior authorization for HYDROcodone-Acetaminophen 5-325MG  tablets is required/requested.   Insurance verification completed.   The patient is insured through [facility] .   Per test claim: PA required; PA submitted to above mentioned insurance via CoverMyMeds Key/confirmation #/EOC [facility] Status is pending

## 2023-08-24 ENCOUNTER — Ambulatory Visit: Payer: Self-pay | Admitting: *Deleted

## 2023-08-24 NOTE — Telephone Encounter (Signed)
She has an apt with you tomorrow

## 2023-08-24 NOTE — Telephone Encounter (Signed)
 Copied from CRM [id number]. Topic: Clinical - Red Word Triage >> Aug 24, 2023  8:40 AM Deaijah H wrote: Red Word that prompted transfer to Nurse Triage: Painful in left side below breast from tree. Painful to move/breathe. Reason for Disposition  [1] Chest wall swelling or pain AND [2] present > 7 days  Answer Assessment - Initial Assessment Questions 1. MECHANISM: How did the injury happen?     I had a tree branch flip back and hit me in the left chest 2 weeks ago.  I was using a handsaw cutting a branch. 2. ONSET: When did the injury happen? (.e.g., minutes, hours, days ago)     2 weeks ago. 3. LOCATION: Where on the chest is the injury located?     It bounced back onto my chest.   I don't think I've broke a rib but it's very tender.   It's at my left breast under it toward the center is sore.   No bruising or cuts.   It's been long enough but it still hurts to move.   I'm using ibuprofen .   4. APPEARANCE: What does the injury look like?     No bruising or cuts. 5. BLEEDING: Is there any bleeding now? If Yes, ask: How long has it been bleeding?     No 6. SEVERITY: Any difficulty with breathing?     It hurts to breath in deeply.   7. SIZE: For cuts, bruises, or swelling, ask: How large is it? (e.g., inches or centimeters)     Nothing external 8. PAIN: Is there pain? If Yes, ask: How bad is the pain? (e.g., Scale 0-10; none, mild, moderate, severe)     Yes  I'm using ibuprofen  at night due to the pain. 9. TETANUS: For any breaks in the skin, ask: When was your last tetanus booster?     Not asked 10. PREGNANCY: Is there any chance you are pregnant? When was your last menstrual period?       N/A due to age  Protocols used: Chest Injury-A-AH FYI Only or Action Required?: FYI only for provider.  Patient was last seen in primary care on 04/20/2023 by Saguier, Edward, PA-C.  Called Nurse Triage reporting Chest Injury.  Symptoms began several weeks  ago.  Interventions attempted: OTC medications: ibuprofen  at night.  Symptoms are: unchanged.  Triage Disposition: See PCP When Office is Open (Within 3 Days)  Patient/caregiver understands and will follow disposition?: Yes

## 2023-08-25 ENCOUNTER — Ambulatory Visit: Payer: Self-pay | Admitting: Medical

## 2023-08-25 ENCOUNTER — Ambulatory Visit (INDEPENDENT_AMBULATORY_CARE_PROVIDER_SITE_OTHER): Admitting: Medical

## 2023-08-25 ENCOUNTER — Ambulatory Visit (HOSPITAL_BASED_OUTPATIENT_CLINIC_OR_DEPARTMENT_OTHER)
Admission: RE | Admit: 2023-08-25 | Discharge: 2023-08-25 | Disposition: A | Discharge status: Home / Self Care | Source: Ambulatory Visit | Attending: Medical | Admitting: Medical

## 2023-08-25 ENCOUNTER — Encounter: Payer: Self-pay | Admitting: Medical

## 2023-08-25 VITALS — BP 120/80 | HR 63 | Temp 98.0°F | Resp 15 | Ht 66.0 in | Wt 180.8 lb

## 2023-08-25 DIAGNOSIS — R0781 Pleurodynia: Secondary | ICD-10-CM

## 2023-08-25 MED ORDER — TRAMADOL HCL 50 MG PO TABS: 50.0000 mg | ORAL_TABLET | Freq: Four times a day (QID) | ORAL | 0 refills | Status: AC | PRN

## 2023-08-25 NOTE — Patient Instructions (Signed)
 Left lower rib injury Two-week history of left lower rib pain post-accident. Pain exacerbated by movement. Differential: bruised vs. fractured rib. Discussed periosteum pain in bruised ribs. - Order stat X-ray of left rib series. - Advise ibuprofen  400 mg with low dose Tylenol  for pain. - Determine pain medication based on X-ray: tramadol  if no fracture, Norco if fracture. - Review X-ray results within 2-3 hours and communicate findings.  General Health Maintenance Discussed Shingrix vaccine timing post-shingles. Recommended six-month wait post shingles-recovery. - Schedule Shingrix vaccine for early December as a nurse visit.  Follow up date to be determined after xray review

## 2023-08-25 NOTE — Addendum Note (Signed)
 Addended by: DORINA DALLAS HERO on: 08/25/2023 09:33 PM   Modules accepted: Orders

## 2023-08-25 NOTE — Progress Notes (Signed)
 Subjective:    Patient ID: Karen Merritt, female    DOB: [birth year], 53 y.o.   MRN: [id number]  HPI Karen Merritt is a 53 year old female who presents with left lower rib pain following a tree cutting accident.  Two weeks ago, she was cutting down a cedar tree on a bank when the tree fell and a part of it bounced back, hitting her in the left lower rib area. Since the incident, she has experienced significant pain in the area, especially when taking deep breaths, twisting, turning, or lying down. The pain is constant, with a severity of 7 to 8 out of 10 without medication.  She has been taking ibuprofen  for pain relief, which reduces the pain to a 5 to 6 out of 10, allowing her to move more comfortably. However, the pain is still present and is exacerbated by activities such as laughing or changing positions in bed, which can take up to ten minutes to get comfortable.  She has not taken any pain medication on the day of the visit.    Review of Systems  Constitutional:  Negative for chills, fatigue and fever.  Respiratory:  Negative for cough, chest tightness and wheezing.   Cardiovascular:  Negative for chest pain and palpitations.  Musculoskeletal:        Left anterior lower rib pain.   No leg pain.  Skin:  Negative for rash.  Neurological:  Negative for dizziness.  Hematological:  Negative for adenopathy.  Psychiatric/Behavioral:  Negative for behavioral problems and confusion.     Past Medical History:  Diagnosis Date   Chest pain    a. non-cardiac 2005, 2012.   Hemorrhoids 11-21-2009   colonoscopy   Hypertrophy, anal papillae 11-21-2009   colonoscopy   Hypothyroidism    a. s/p thyroidectomy ~ 1998   Palpitations      Social History   Socioeconomic History   Marital status: Divorced    Spouse name: Not on file   Number of children: 2   Years of education: 14   Highest education level: Some college, no degree  Occupational History   Occupation: Secretary/administrator: [employer]  Tobacco Use   Smoking status: Never   Smokeless tobacco: Never  Vaping Use   Vaping status: Never Used  Substance and Sexual Activity   Alcohol use: No   Drug use: No   Sexual activity: Not on file  Other Topics Concern   Not on file  Social History Narrative   Lives in [place].  Active, walks/jogs.  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   Right-handed   Caffeine: occasionally      Was recently participating in water aerobics, though recently has not been able, due to headaches.   Social Drivers of Corporate investment banker Strain: Not on file  Food Insecurity: Medium Risk (05/27/2022)   Received from [facility]   Hunger Vital Sign    Within the past 12 months, you worried that your food would run out before you got money to buy more: Never true    Within the past 12 months, the food you bought just didn't last and you didn't have money to get more. : Sometimes true  Transportation Needs: No Transportation Needs (05/27/2022)   Received from [facility]    In the past 12 months, has lack of reliable transportation kept you from medical appointments, meetings, work or from getting things needed  for daily living? : No  Physical Activity: Not on file  Stress: Not on file  Social Connections: Not on file  Intimate Partner Violence: Not on file    Past Surgical History:  Procedure Laterality Date   bone spur  6/11   right big toe   SHOULDER ARTHROSCOPY WITH SUBACROMIAL DECOMPRESSION AND OPEN ROTATOR C Right 05/23/2013   Procedure: RIGHT SHOULDER ARTHROSCOPY WITH SUBACROMIAL DECOMPRESSION AND MINI OPEN ROTATOR CUFF REPAIR, AND DISTAL CLAVICLE RESECTION;  Surgeon: Elspeth JONELLE Her, MD;  Location: [facility] OR;  Service: Orthopedics;  Laterality: Right;   stress cardiolite  05/23/03   THYROIDECTOMY  12/29/96   TOTAL ABDOMINAL HYSTERECTOMY  2008    Family History  Problem Relation Age of Onset   Hypertension Mother    Colon polyps Mother     Coronary artery disease Mother        alive @ 88.   Heart disease Mother 77       10/20/13   Stroke Mother    Heart attack Mother    Other Father        alive and well @ 44.   Hypertension Father    Hypertension Sister    Migraines Sister    Migraines Brother    Bone cancer Maternal Aunt    Thyroid  disease Maternal Aunt    Thyroid  cancer Maternal Aunt    Liver cancer Maternal Aunt    Heart disease Maternal Aunt    Coronary artery disease Maternal Uncle    Hypertension Maternal Uncle    Hypertension Maternal Grandmother    Diabetes Maternal Grandmother    Coronary artery disease Maternal Grandfather    Heart attack Maternal Grandfather 45   Glaucoma Paternal Grandmother    Liver disease Paternal Grandfather    Colon cancer Neg Hx    Esophageal cancer Neg Hx    Stomach cancer Neg Hx    Rectal cancer Neg Hx     No Known Allergies  Current Outpatient Medications on File Prior to Visit  Medication Sig Dispense Refill   dibucaine (NUPERCAINAL) 1 % OINT Place 1 Application rectally as needed for anal irritation. 28 g 1   fluticasone  (FLONASE ) 50 MCG/ACT nasal spray Place 2 sprays into both nostrils daily.     ibuprofen  (ADVIL ,MOTRIN ) 200 MG tablet Take 400 mg by mouth every 6 (six) hours as needed for headache (pain).     levothyroxine  (SYNTHROID ) 100 MCG tablet TAKE 1 TABLET BY MOUTH DAILY 90 tablet 3   Melatonin 3 MG TABS Take 3 mg by mouth at bedtime as needed (sleep).      No current facility-administered medications on file prior to visit.    BP 120/80   Pulse 63   Temp 98 F (36.7 C) (Oral)   Resp 15   Ht 5' 6 (1.676 m)   Wt 180 lb 12.8 oz (82 kg)   SpO2 99%   BMI 29.18 kg/m        Objective:   Physical Exam  General- No acute distress. Pleasant patient. Neck- Full range of motion, no jvd. No tracheal deviation. Lungs- Clear, even and unlabored. Heart- regular rate and rhythm. Neurologic- CNII- XII grossly intact.  Anterior thorax- left lower rib  pain to palpation.  Lower ext- calfs symmetric, negative homans sign. No pedal edema.     Assessment & Plan:   Patient Instructions  Left lower rib injury Two-week history of left lower rib pain post-accident. Pain exacerbated by movement. Differential: bruised vs. fractured  rib. Discussed periosteum pain in bruised ribs. - Order stat X-ray of left rib series. - Advise ibuprofen  400 mg with low dose Tylenol  for pain. - Determine pain medication based on X-ray: tramadol  if no fracture, Norco if fracture. - Review X-ray results within 2-3 hours and communicate findings.  General Health Maintenance Discussed Shingrix vaccine timing post-shingles. Recommended six-month wait post shingles-recovery. - Schedule Shingrix vaccine for early December as a nurse visit.  Follow up date to be determined after xray review   [place] [employer], PA-C

## 2023-11-16 ENCOUNTER — Other Ambulatory Visit (HOSPITAL_BASED_OUTPATIENT_CLINIC_OR_DEPARTMENT_OTHER): Payer: Self-pay

## 2023-11-16 MED ORDER — COMIRNATY 30 MCG/0.3ML IM SUSY
0.3000 mL | PREFILLED_SYRINGE | Freq: Once | INTRAMUSCULAR | 0 refills | Status: AC
  Filled 2023-11-16: qty 0.3, 1d supply, fill #0

## 2023-12-20 ENCOUNTER — Other Ambulatory Visit: Payer: Self-pay | Admitting: Internal Medicine

## 2023-12-20 DIAGNOSIS — E890 Postprocedural hypothyroidism: Secondary | ICD-10-CM

## 2024-02-09 NOTE — Progress Notes (Unsigned)
 Patient ID: Karen Merritt, female   DOB: Mar 18, 1970, 54 y.o.   MRN: [id number]  HPI  Karen Merritt is a 54 y.o.-year-old female, returning for follow-up for uncontrolled postsurgical hypothyroidism.  She previously saw Dr. Kassie, but last visit with me 1 year ago.  Interim history: She feels well, without complaints today other than hair loss, which persists. Before last visit, she is trying to use essential oils [place]) on her scalp twice a week. She had herpes (03/2023).  This resolved.  Reviewed history: Pt. has been dx with history of postsurgical hypothyroidism developed after her thyroidectomy for goiter + hyperthyroidism in 1998 >> on generic levothyroxine . 07/2021: LT4 dose increased from 100 >> 125 mcg daily 09/2021: LT4 dose increased from 125 >> 112 mcg daily 10/2021: LT4 dose increased from 112 >> 100 mcg daily  Pt is on levothyroxine  100 mcg daily: - in am - fasting - at least 30 min from b'fast - no calcium  - no iron - no multivitamins - no PPIs - not on Biotin  I reviewed pt's thyroid  tests: Lab Results  Component Value Date   TSH 0.53 02/10/2023   TSH 2.59 08/27/2022   TSH 0.54 02/07/2022   TSH 0.64 12/19/2021   TSH 0.05 (L) 11/04/2021   TSH 0.27 (L) 09/20/2021   TSH 12.02 (H) 08/07/2021   TSH 7.54 (H) 11/26/2020   TSH 2.77 11/21/2019   TSH 2.37 11/15/2018   FREET4 1.3 02/10/2023   FREET4 0.92 08/27/2022   FREET4 1.15 02/07/2022   FREET4 0.97 12/19/2021   FREET4 1.15 11/04/2021   FREET4 1.25 09/20/2021   FREET4 0.92 08/07/2021   FREET4 1.06 11/26/2020   FREET4 0.84 11/21/2019   FREET4 0.92 11/15/2018   T3FREE 3.0 09/28/2015   T3FREE 3.3 07/28/2014   T3FREE 3.0 07/18/2014   T3FREE 3.4 06/29/2009   Antithyroid antibodies: No results found for: THGAB No components found for: TPOAB  Pt denies: - weight gain - fatigue  She has: - longstanding hair loss  Pt denies feeling nodules in neck, hoarseness, dysphagia/odynophagia.  She  has + FH of thyroid  disease in: Maternal aunt >> thyroid  cancer.  No h/o radiation tx to head or neck. No use of iodine supplements or steroids  ROS: + See HPI  Past Medical History:  Diagnosis Date   Chest pain    a. non-cardiac 2005, 2012.   Hemorrhoids 11-21-2009   colonoscopy   Hypertrophy, anal papillae 11-21-2009   colonoscopy   Hypothyroidism    a. s/p thyroidectomy ~ 1998   Palpitations    Past Surgical History:  Procedure Laterality Date   bone spur  6/11   right big toe   SHOULDER ARTHROSCOPY WITH SUBACROMIAL DECOMPRESSION AND OPEN ROTATOR C Right 05/23/2013   Procedure: RIGHT SHOULDER ARTHROSCOPY WITH SUBACROMIAL DECOMPRESSION AND MINI OPEN ROTATOR CUFF REPAIR, AND DISTAL CLAVICLE RESECTION;  Surgeon: Elspeth JONELLE Her, MD;  Location: [facility] OR;  Service: Orthopedics;  Laterality: Right;   stress cardiolite  05/23/03   THYROIDECTOMY  12/29/96   TOTAL ABDOMINAL HYSTERECTOMY  2008   Social History   Socioeconomic History   Marital status: Divorced    Spouse name: Not on file   Number of children: 2   Years of education: 14   Highest education level: Some college, no degree  Occupational History   Occupation: Quarry Manager: [employer]  Tobacco Use   Smoking status: Never   Smokeless tobacco: Never  Vaping Use   Vaping status:  Never Used  Substance and Sexual Activity   Alcohol use: No   Drug use: No   Sexual activity: Not on file  Other Topics Concern   Not on file  Social History Narrative   Lives in [place].  Active, walks/jogs.  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   Right-handed   Caffeine: occasionally      Was recently participating in water aerobics, though recently has not been able, due to headaches.   Social Drivers of Health   Tobacco Use: Low Risk (11/20/2023)   Received from [facility]   Patient History    Smoking Tobacco Use: Never    Smokeless Tobacco Use: Never    Passive Exposure: Not on file  Financial Resource Strain:  Not on file  Food Insecurity: Medium Risk (05/27/2022)   Received from [facility]   Epic    Within the past 12 months, you worried that your food would run out before you got money to buy more: Never true    Within the past 12 months, the food you bought just didn't last and you didn't have money to get more. : Sometimes true  Transportation Needs: No Transportation Needs (05/27/2022)   Received from [facility]    In the past 12 months, has lack of reliable transportation kept you from medical appointments, meetings, work or from getting things needed for daily living? : No  Physical Activity: Not on file  Stress: Not on file  Social Connections: Not on file  Intimate Partner Violence: Not on file  Depression (PHQ2-9): Low Risk (08/25/2023)   Depression (PHQ2-9)    PHQ-2 Score: 0  Alcohol Screen: Not on file  Housing: Low Risk (05/27/2022)   Received from [facility]   Epic    What is your living situation today?: I have a steady place to live    Think about the place you live. Do you have problems with any of the following? Choose all that apply:: None/None on this list  Utilities: Low Risk (05/27/2022)   Received from [facility]   Utilities    In the past 12 months has the electric, gas, oil, or water company threatened to shut off services in your home? : No  Health Literacy: Not on file   Current Outpatient Medications on File Prior to Visit  Medication Sig Dispense Refill   dibucaine (NUPERCAINAL) 1 % OINT Place 1 Application rectally as needed for anal irritation. 28 g 1   fluticasone  (FLONASE ) 50 MCG/ACT nasal spray Place 2 sprays into both nostrils daily.     ibuprofen  (ADVIL ,MOTRIN ) 200 MG tablet Take 400 mg by mouth every 6 (six) hours as needed for headache (pain).     levothyroxine  (SYNTHROID ) 100 MCG tablet TAKE 1 TABLET BY MOUTH DAILY 90 tablet 3   Melatonin 3 MG TABS Take 3 mg by mouth at bedtime as needed (sleep).      No current  facility-administered medications on file prior to visit.   No Known Allergies Family History  Problem Relation Age of Onset   Hypertension Mother    Colon polyps Mother    Coronary artery disease Mother        alive @ 46.   Heart disease Mother 44       10/20/13   Stroke Mother    Heart attack Mother    Other Father        alive and well @ 52.   Hypertension Father  Hypertension Sister    Migraines Sister    Migraines Brother    Bone cancer Maternal Aunt    Thyroid  disease Maternal Aunt    Thyroid  cancer Maternal Aunt    Liver cancer Maternal Aunt    Heart disease Maternal Aunt    Coronary artery disease Maternal Uncle    Hypertension Maternal Uncle    Hypertension Maternal Grandmother    Diabetes Maternal Grandmother    Coronary artery disease Maternal Grandfather    Heart attack Maternal Grandfather 19   Glaucoma Paternal Grandmother    Liver disease Paternal Grandfather    Colon cancer Neg Hx    Esophageal cancer Neg Hx    Stomach cancer Neg Hx    Rectal cancer Neg Hx    PE: There were no vitals taken for this visit. Wt Readings from Last 10 Encounters:  08/25/23 180 lb 12.8 oz (82 kg)  04/20/23 184 lb (83.5 kg)  04/09/23 185 lb 6.4 oz (84.1 kg)  02/10/23 182 lb 3.2 oz (82.6 kg)  10/27/22 179 lb (81.2 kg)  09/26/22 179 lb (81.2 kg)  08/08/22 179 lb 12.8 oz (81.6 kg)  07/30/22 176 lb 9.6 oz (80.1 kg)  06/27/22 172 lb 12.8 oz (78.4 kg)  06/25/22 177 lb 3.2 oz (80.4 kg)   Constitutional: overweight, in NAD Eyes:  EOMI, no exophthalmos ENT: no neck masses, no cervical lymphadenopathy Cardiovascular: RRR, No MRG Respiratory: CTA B Musculoskeletal: no deformities Skin:no rashes, diffuse hair loss on scalp Neurological: no tremor with outstretched hands  ASSESSMENT: 1.  Postsurgical hypothyroidism - s/p thyroidectomy in 1998  2.  Hair loss  PLAN:  1. Patient with longstanding hypothyroidism, on levothyroxine  - latest thyroid  labs reviewed with pt. >>  normal: Lab Results  Component Value Date   TSH 0.53 02/10/2023  - she continues on LT4 100 mcg daily - pt feels good on this dose. - we discussed about taking the thyroid  hormone every day, with water, >30 minutes before breakfast, separated by >4 hours from acid reflux medications, calcium , iron, multivitamins. Pt. is taking it correctly. - will check thyroid  tests today: TSH and fT4 - If labs are abnormal, she will need to return for repeat TFTs in 1.5 months - I will see her back in a year or possibly sooner for labs  Usually needs 90-day refills - [facility] - account: [id number]. Order number [id number]. OTW, can use [facility].  2. Hair loss - Chronic - Likely not related to her hypothyroidism since this is well-controlled - She sees dermatology -We previously discussed about checking with dermatology if she could use Qilib spray or Rogaine foam -However, before last visit, she started essential oils and she was experimenting with wigs.  I also recommended scalp concealer  Lela Fendt, MD PhD [facility]

## 2024-02-10 ENCOUNTER — Other Ambulatory Visit

## 2024-02-10 ENCOUNTER — Encounter: Payer: Self-pay | Admitting: Internal Medicine

## 2024-02-10 ENCOUNTER — Ambulatory Visit (INDEPENDENT_AMBULATORY_CARE_PROVIDER_SITE_OTHER): Payer: 59 | Admitting: Internal Medicine

## 2024-02-10 VITALS — BP 112/64 | HR 73 | Ht 66.0 in | Wt 182.4 lb

## 2024-02-10 DIAGNOSIS — L659 Nonscarring hair loss, unspecified: Secondary | ICD-10-CM | POA: Diagnosis not present

## 2024-02-10 DIAGNOSIS — E890 Postprocedural hypothyroidism: Secondary | ICD-10-CM | POA: Diagnosis not present

## 2024-02-10 NOTE — Patient Instructions (Addendum)
 Please continue Levothyroxine  100 mcg daily.  Take the thyroid  hormone every day, with water, at least 30 minutes before breakfast, separated by at least 4 hours from: - acid reflux medications - calcium  - iron - multivitamins  Please stop at the lab.  Try Qilib and scalp concealer.  Please come back for a follow-up appointment in 11 months.

## 2024-02-11 ENCOUNTER — Ambulatory Visit: Payer: Self-pay | Admitting: Internal Medicine

## 2024-02-11 LAB — TSH: TSH: 0.38 m[IU]/L — ABNORMAL LOW

## 2024-02-11 LAB — T4, FREE: Free T4: 1.4 ng/dL (ref 0.8–1.8)

## 2024-02-11 MED ORDER — LEVOTHYROXINE SODIUM 88 MCG PO TABS: 88.0000 ug | ORAL_TABLET | Freq: Every day | ORAL | 3 refills | Status: AC

## 2024-02-11 NOTE — Addendum Note (Signed)
 Addended by: TRIXIE FILE on: 02/11/2024 04:31 PM   Modules accepted: Orders

## 2025-01-10 ENCOUNTER — Ambulatory Visit: Admitting: Internal Medicine
